# Patient Record
Sex: Female | Born: 1985 | Race: Black or African American | Hispanic: No | Marital: Single | Smoking: Current some day smoker
Health system: Southern US, Community
[De-identification: ages and names within clinical notes are randomized; demographics above are authoritative.]

## PROBLEM LIST (undated history)

## (undated) DIAGNOSIS — A749 Chlamydial infection, unspecified: Secondary | ICD-10-CM

## (undated) DIAGNOSIS — O139 Gestational [pregnancy-induced] hypertension without significant proteinuria, unspecified trimester: Secondary | ICD-10-CM

## (undated) DIAGNOSIS — A599 Trichomoniasis, unspecified: Secondary | ICD-10-CM

## (undated) DIAGNOSIS — Z789 Other specified health status: Secondary | ICD-10-CM

## (undated) DIAGNOSIS — G35 Multiple sclerosis: Secondary | ICD-10-CM

## (undated) DIAGNOSIS — G35D Multiple sclerosis, unspecified: Secondary | ICD-10-CM

## (undated) HISTORY — PX: MULTIPLE TOOTH EXTRACTIONS: SHX2053

---
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
## 2004-06-12 ENCOUNTER — Emergency Department (HOSPITAL_COMMUNITY): Admission: EM | Admit: 2004-06-12 | Discharge: 2004-06-12 | Payer: Self-pay | Admitting: Emergency Medicine

## 2005-04-01 ENCOUNTER — Emergency Department (HOSPITAL_COMMUNITY): Admission: EM | Admit: 2005-04-01 | Discharge: 2005-04-02 | Payer: Self-pay | Admitting: Emergency Medicine

## 2005-09-03 ENCOUNTER — Inpatient Hospital Stay (HOSPITAL_COMMUNITY): Admission: AD | Admit: 2005-09-03 | Discharge: 2005-09-03 | Payer: Self-pay | Admitting: Obstetrics & Gynecology

## 2005-09-27 ENCOUNTER — Ambulatory Visit (HOSPITAL_COMMUNITY): Admission: RE | Admit: 2005-09-27 | Discharge: 2005-09-27 | Payer: Self-pay | Admitting: Obstetrics

## 2005-09-27 IMAGING — US US OB COMP +14 WK
1 series · 13 of 28 positions shown · non-contrast
Comparison: none

CLINICAL DATA: Anatomic exam.  No current problems.

[Series 1: us ob comp +14 wk · 0.33mm/px · 13 of 88 slices shown]
[im 4/88]
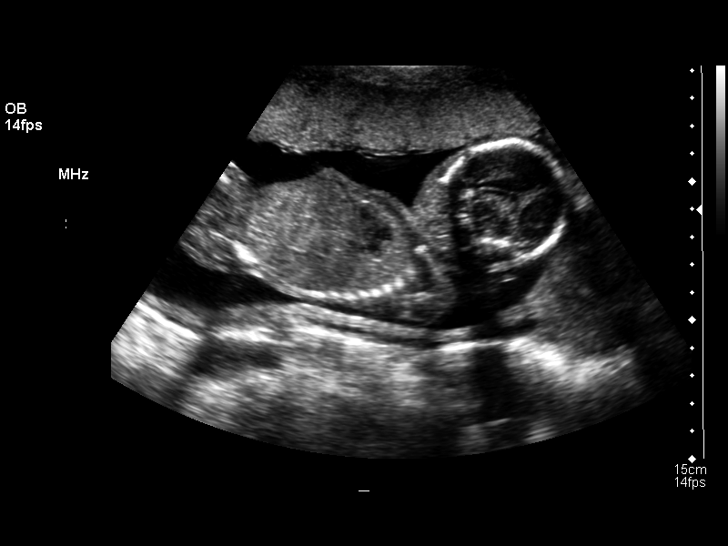
[im 10/88]
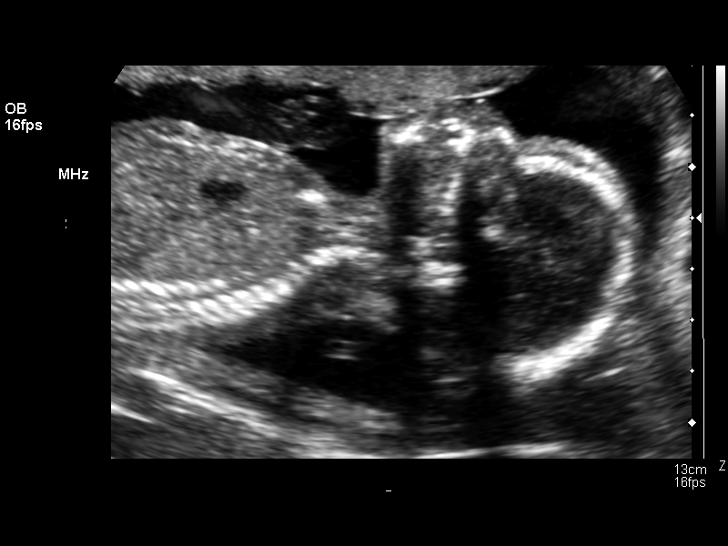
[im 17/88]
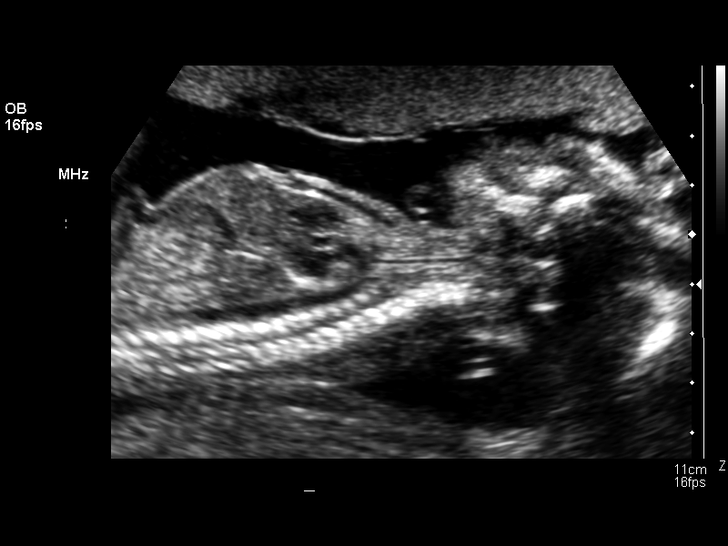
[im 23/88]
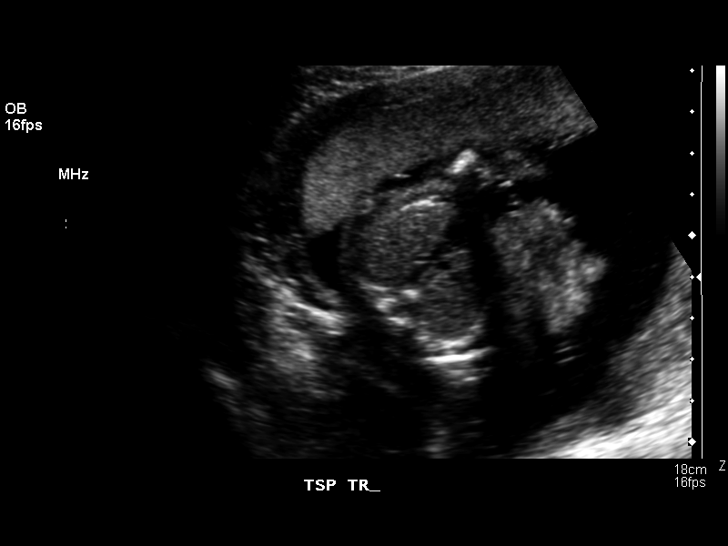
[im 30/88]
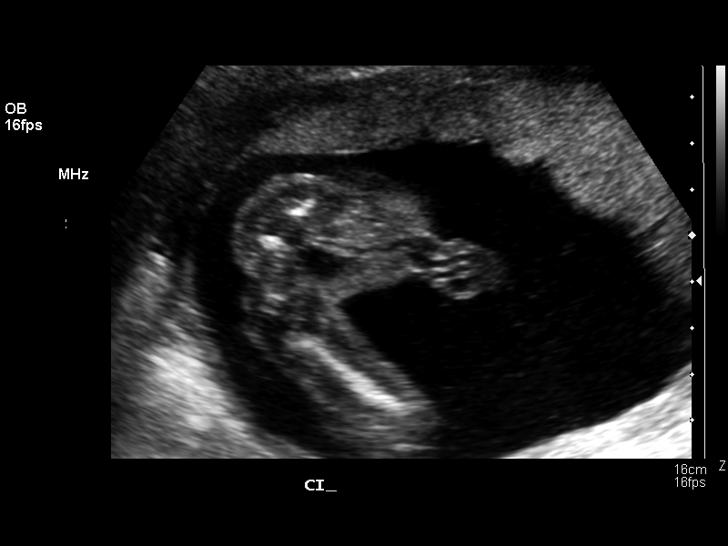
[im 36/88]
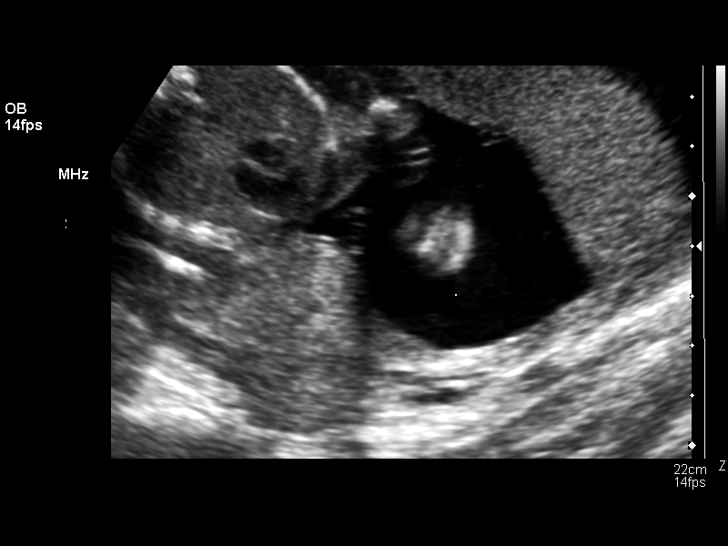
[im 46/88]
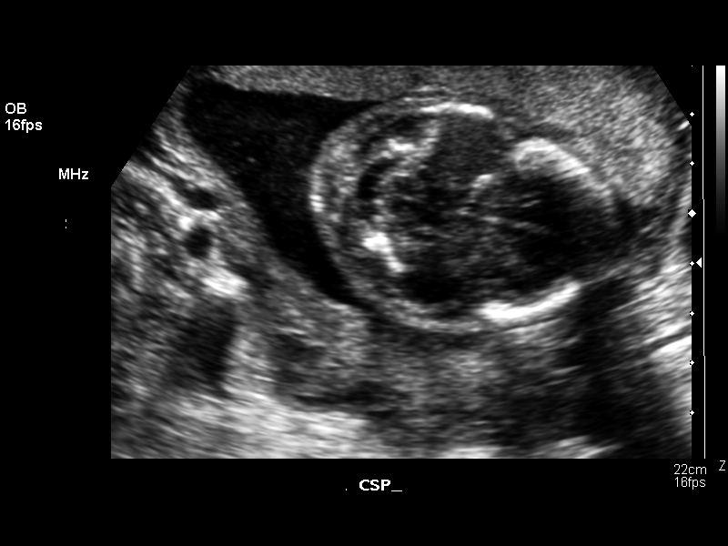
[im 52/88]
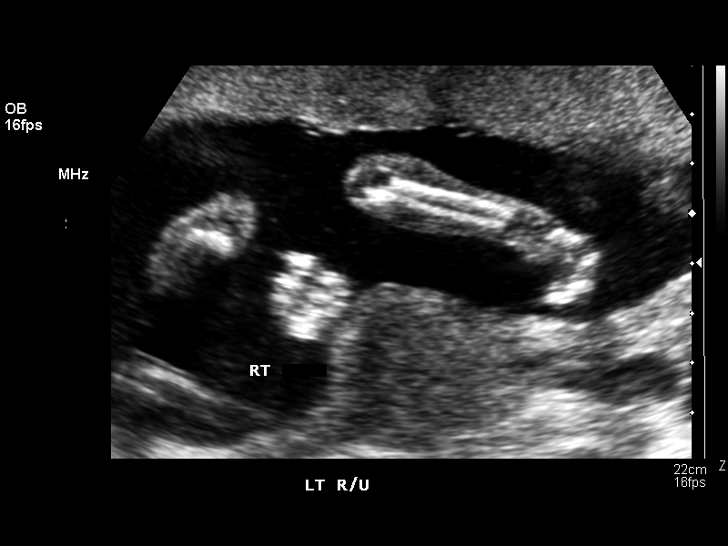
[im 59/88]
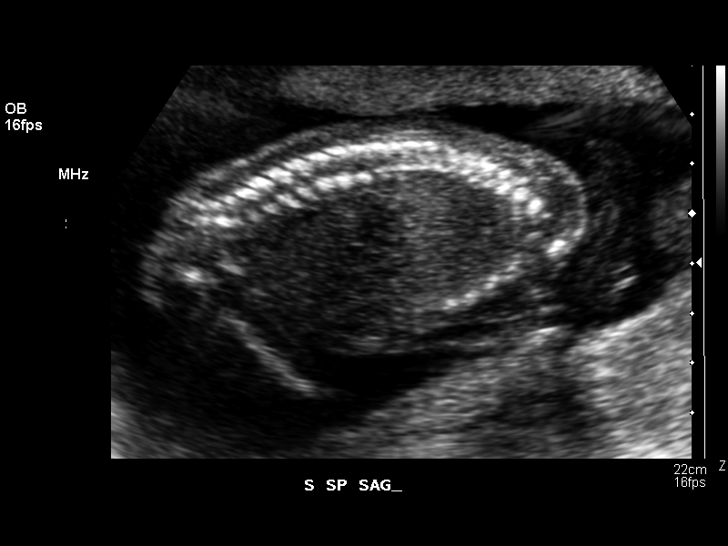
[im 65/88]
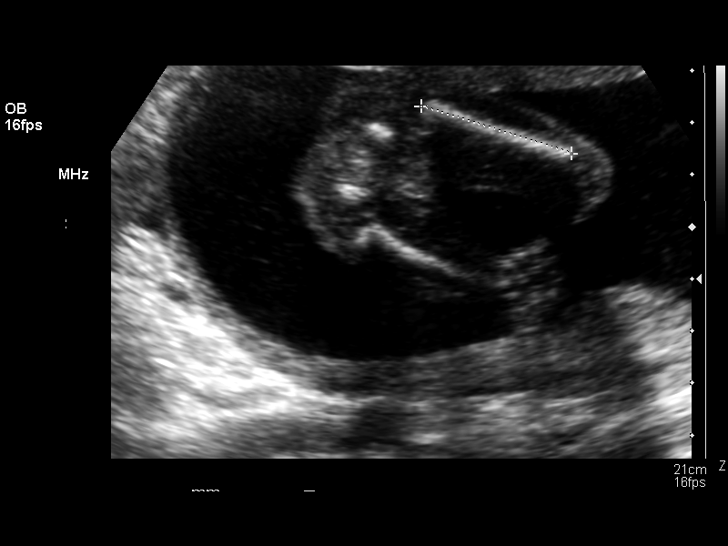
[im 71/88]
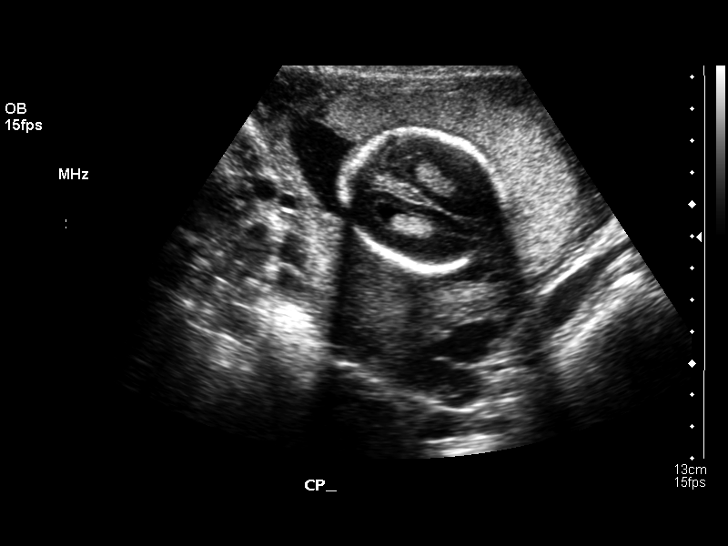
[im 78/88]
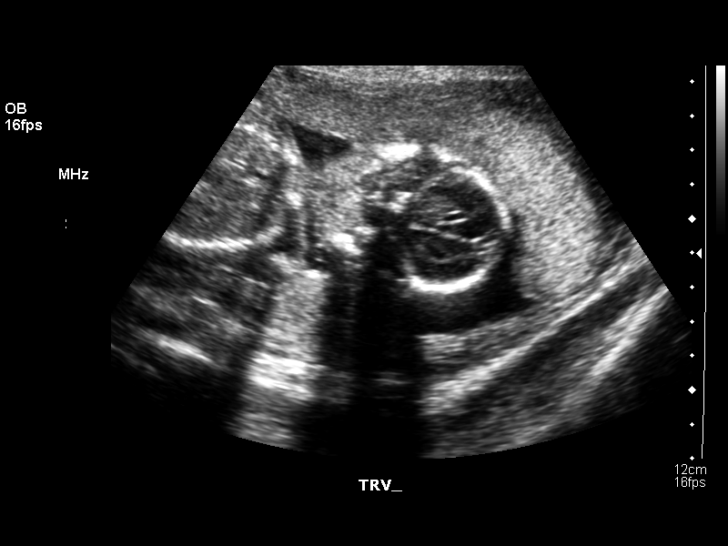
[im 84/88]
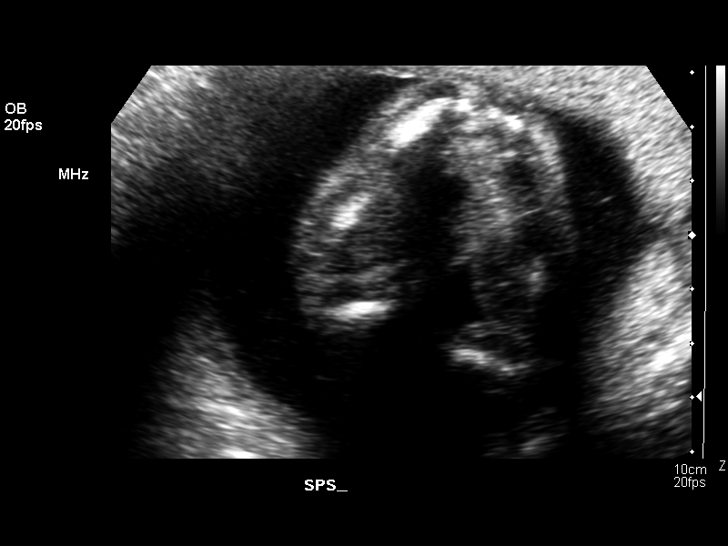

[13 of 28 positions shown; findings below may reference images not displayed]

OBSTETRICAL ULTRASOUND:
 Number of Fetuses: 1
 Heart Rate:  144
 Movement:  Yes
 Breathing:  No
 Presentation:  Transverse
 Placental Location:  Anterior
 Grade:  I
 Previa: NO
 Amniotic Fluid (Subjective): Normal
 Amniotic Fluid (Objective):   4.7 cm Vertical pocket 

 FETAL BIOMETRY
 BPD:   4.4 cm  19 w 2 d
 HC:   16.7 cm  19 w 2 d
 AC:   14.2 cm  19 w 4 d
 FL:    3.0 cm  19 w 2 d

 MEAN GA:  19 w 3 d  US EDC:  [DATE]

 FETAL ANATOMY
 Lateral Ventricles:    Visualized 
 Thalami/CSP:      Visualized 
 Posterior Fossa:  Visualized 
 Nuchal Region:    Visualized 
 Spine:      Visualized 
 4 Chamber Heart on Left:      Visualized 
 Stomach on Left:      Visualized 
 3 Vessel Cord:    Visualized 
 Cord Insertion site:    Visualized 
 Kidneys:  Visualized 
 Bladder:  Visualized 
 Extremities:      Visualized 

 ADDITIONAL ANATOMY VISUALIZED:  LVOT, RVOT, upper lip, orbits, profile, diaphragm, both heels, 5th digit, ductal arch, aortic arch, and female genitalial.

 MATERNAL UTERINE AND ADNEXAL FINDINGS
 Cervix: 5.6 cm Transabdominally
IMPRESSION: 1. Single intrauterine pregnancy demonstrating an estimated gestational age by ultrasound of 19 weeks and 3 days. Correlation with expected estimated gestational age by initial ultrasound of 19 weeks and 3 days correlates with appropriate growth.  
 2.  No focal fetal or placental abnormalities are noted with a good anatomic exam possible.
 3. Subjectively and quantitatively normal amniotic fluid volume and normal cervical length.

## 2006-02-15 ENCOUNTER — Inpatient Hospital Stay (HOSPITAL_COMMUNITY): Admission: AD | Admit: 2006-02-15 | Discharge: 2006-02-17 | Payer: Self-pay | Admitting: Obstetrics & Gynecology

## 2007-01-29 ENCOUNTER — Other Ambulatory Visit: Payer: Self-pay

## 2007-01-29 ENCOUNTER — Other Ambulatory Visit: Payer: Self-pay | Admitting: Family Medicine

## 2007-03-01 ENCOUNTER — Emergency Department (HOSPITAL_COMMUNITY): Admission: EM | Admit: 2007-03-01 | Discharge: 2007-03-01 | Payer: Self-pay | Admitting: Emergency Medicine

## 2007-03-29 ENCOUNTER — Inpatient Hospital Stay (HOSPITAL_COMMUNITY): Admission: AD | Admit: 2007-03-29 | Discharge: 2007-03-29 | Payer: Self-pay | Admitting: Obstetrics & Gynecology

## 2007-03-29 IMAGING — US US OB COMP LESS 14 WK
1 series · 14 of 28 positions shown · non-contrast
Comparison: None.

OBSTETRICAL ULTRASOUND (<14 WK OB):

Transabdominal scanning was performed.

CLINICAL DATA: Cramping and episode of vaginal bleeding.

[Series 1: us ob comp less 14 wk · 14 of 33 slices shown]
[im 2/33]
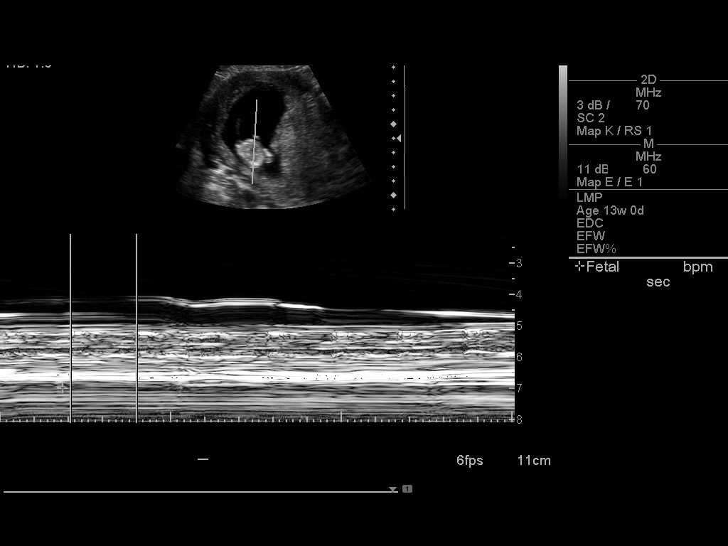
[im 4/33]
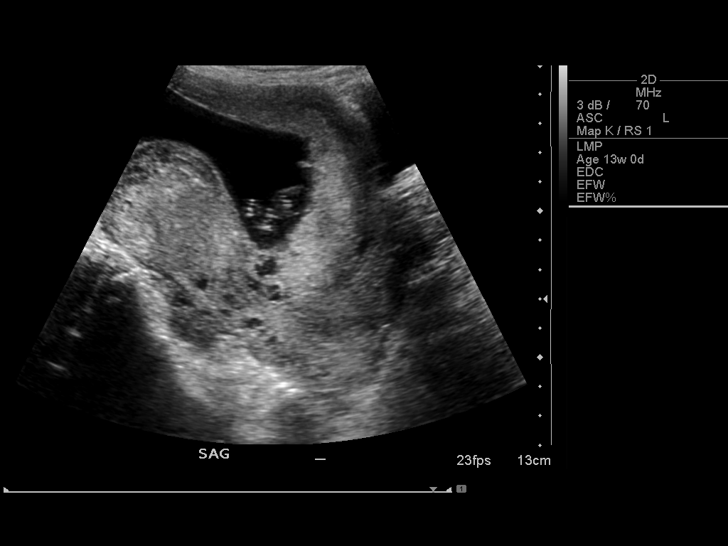
[im 6/33]
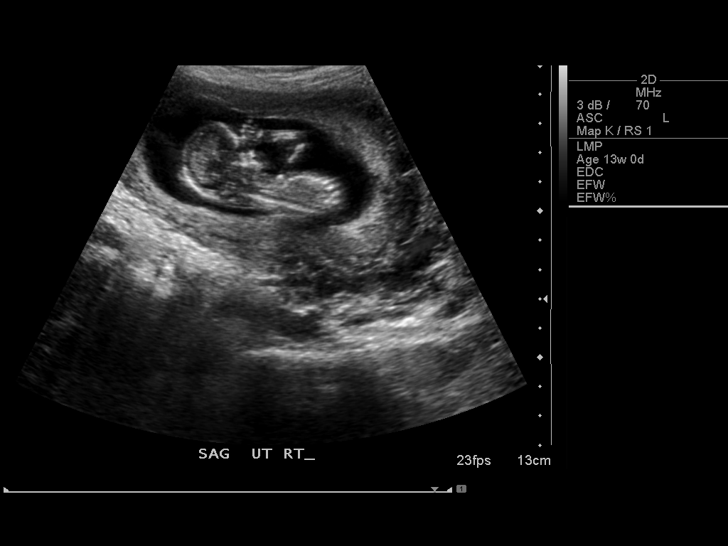
[im 9/33]
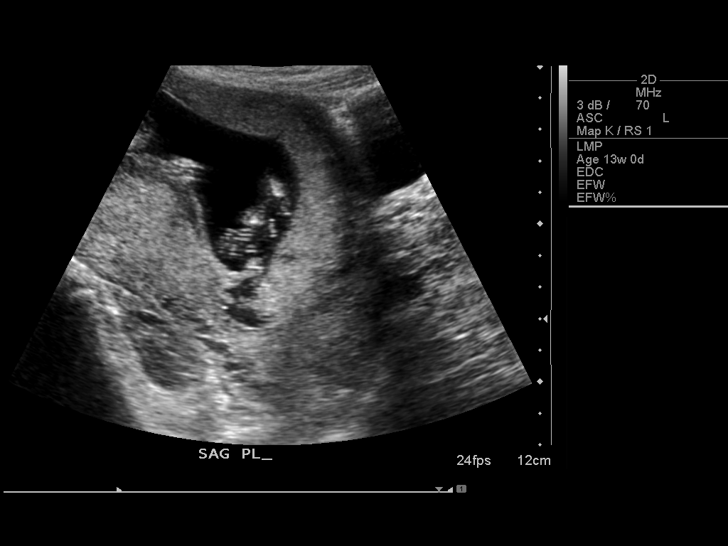
[im 11/33]
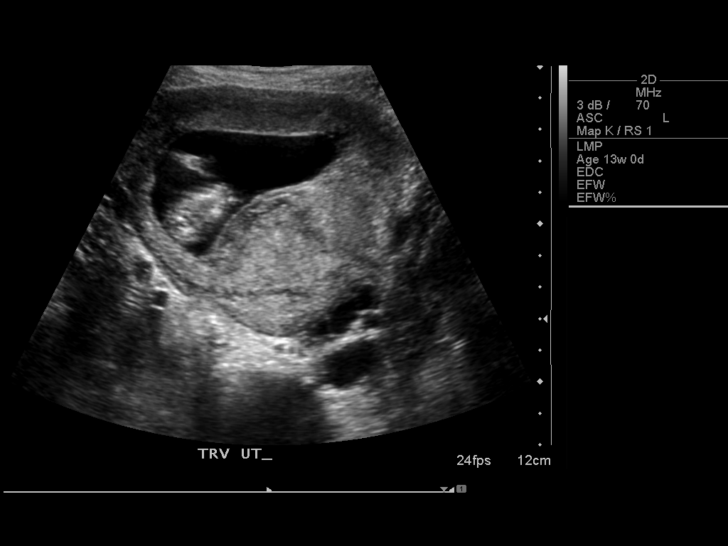
[im 14/33]
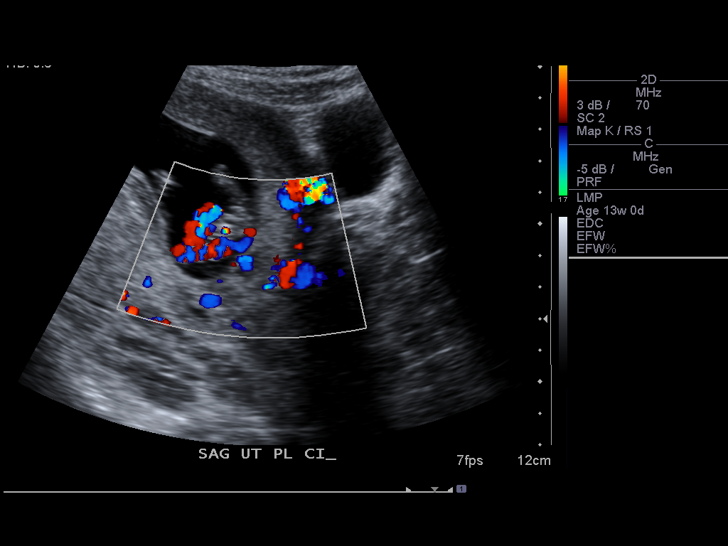
[im 16/33]
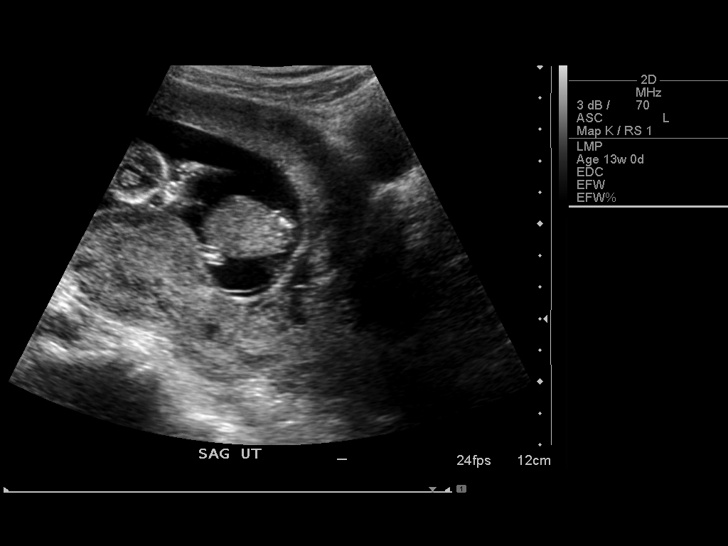
[im 18/33]
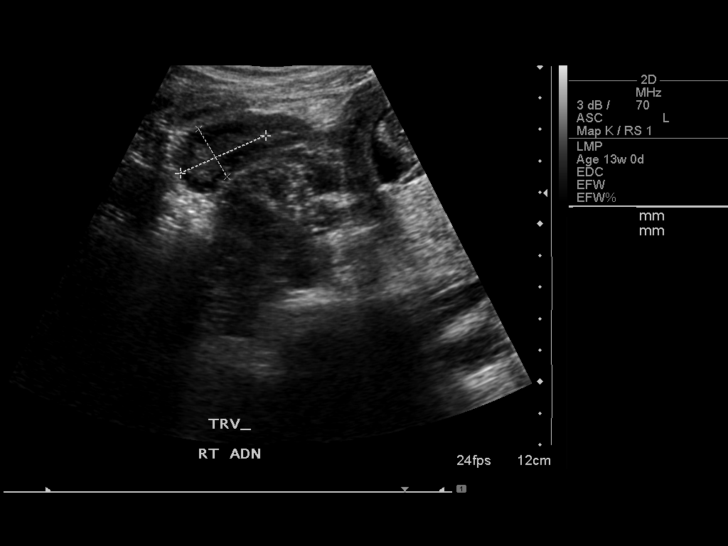
[im 21/33]
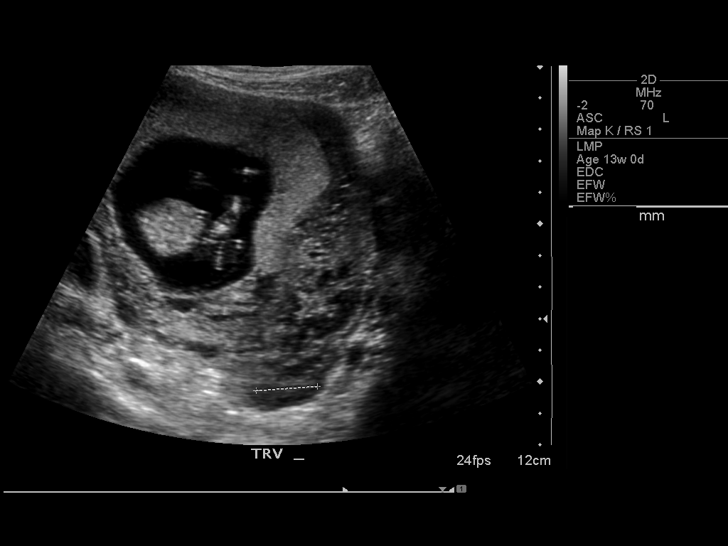
[im 23/33]
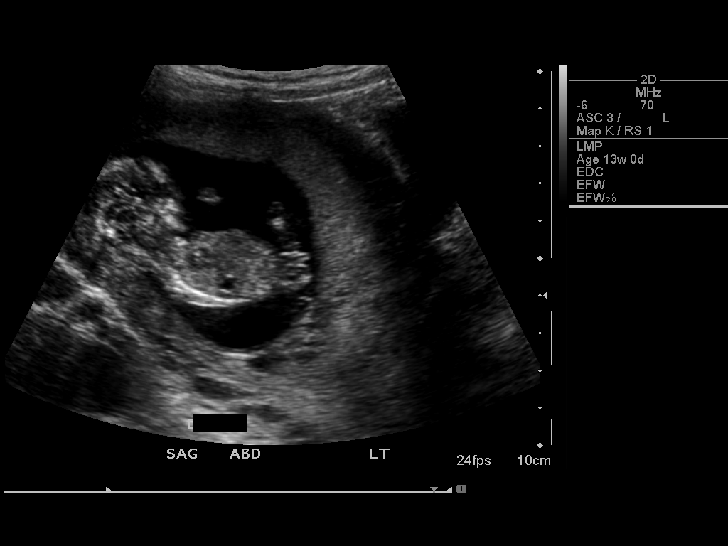
[im 25/33]
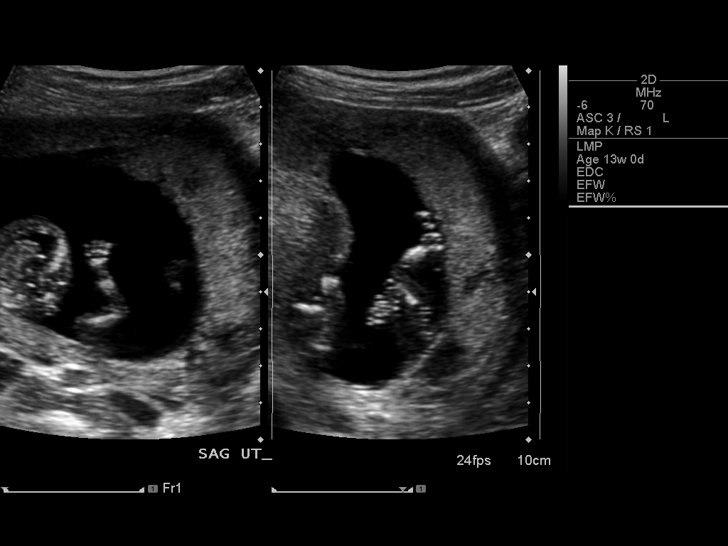
[im 28/33]
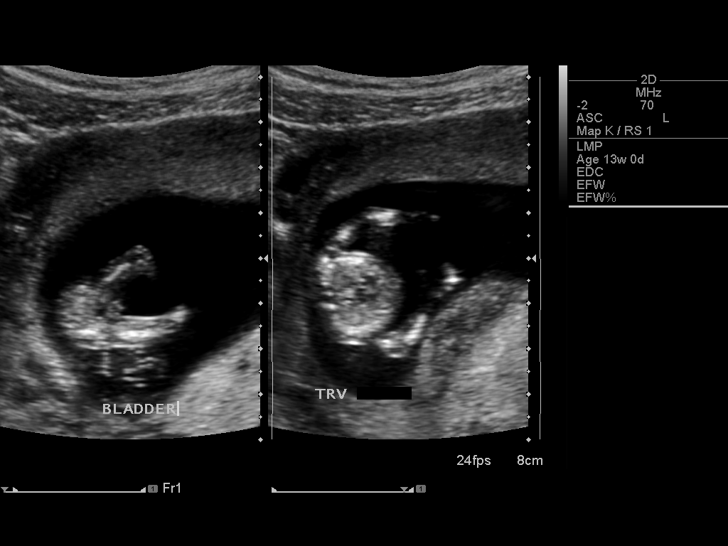
[im 30/33]
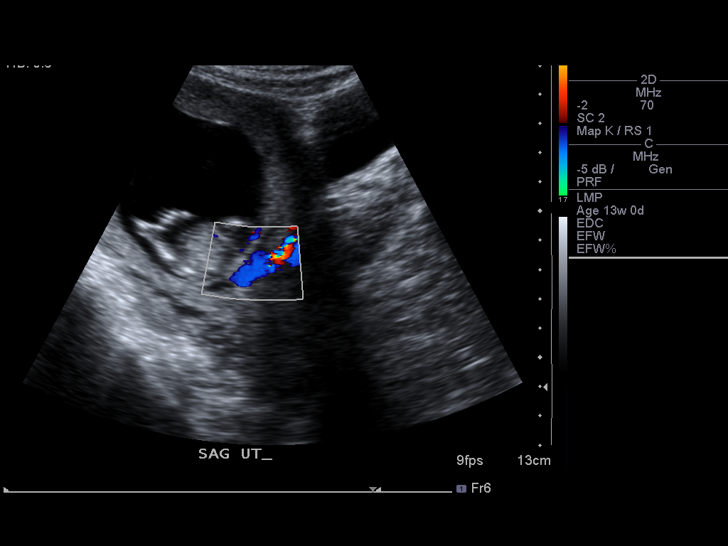
[im 33/33]
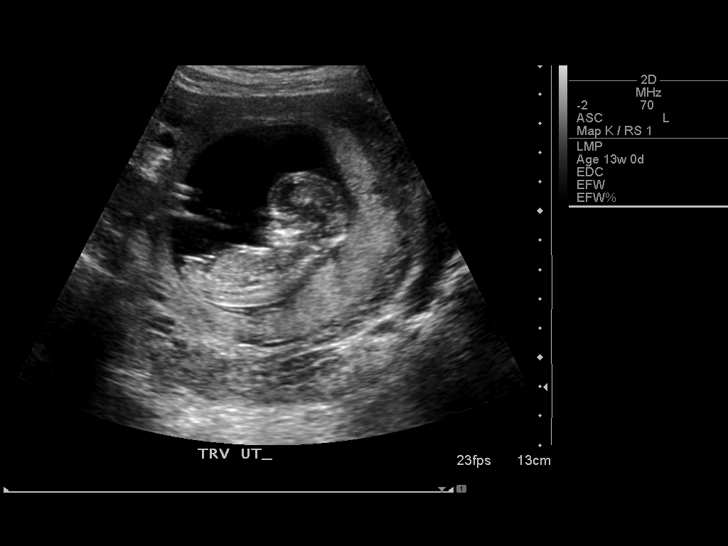

[14 of 28 positions shown; findings below may reference images not displayed]

FINDING: A single living intrauterine gestation is identified. Crown-rump length
of the fetus is 6.9 cm which estimates a 13 week 1 day gestational age. Cardiac
activity is evident within the fetus on to the grayscale imaging. Fetal heart
rate is measured at 155 beats per minute. The amnion is evident. No evidence for
retroplacental fluid collection.

Both ovaries are sonographically normal.
IMPRESSION: Single intrauterine living gestation at an estimated 13 week 1 day gestational
age by crown-rump length.

No retroplacental fluid collection.

## 2008-09-15 ENCOUNTER — Emergency Department (HOSPITAL_COMMUNITY): Admission: EM | Admit: 2008-09-15 | Discharge: 2008-09-15 | Payer: Self-pay | Admitting: Emergency Medicine

## 2008-12-22 ENCOUNTER — Emergency Department (HOSPITAL_COMMUNITY): Admission: EM | Admit: 2008-12-22 | Discharge: 2008-12-22 | Payer: Self-pay | Admitting: Emergency Medicine

## 2009-01-21 ENCOUNTER — Emergency Department (HOSPITAL_COMMUNITY): Admission: EM | Admit: 2009-01-21 | Discharge: 2009-01-21 | Payer: Self-pay | Admitting: Family Medicine

## 2009-02-18 ENCOUNTER — Emergency Department (HOSPITAL_COMMUNITY): Admission: EM | Admit: 2009-02-18 | Discharge: 2009-02-18 | Payer: Self-pay | Admitting: Emergency Medicine

## 2009-11-08 ENCOUNTER — Emergency Department (HOSPITAL_COMMUNITY): Admission: EM | Admit: 2009-11-08 | Discharge: 2009-11-08 | Payer: Self-pay | Admitting: Emergency Medicine

## 2010-03-28 NOTE — L&D Delivery Note (Signed)
Delivery Note At  a viable unspecified sex was delivered via  (Presentation: ;  ).  APGAR: , ; weight .   Placenta status: , .  Cord:  with the following complications: .  Cord pH: not done  Anesthesia: None  Episiotomy:  Lacerations:  Suture Repair: 2.0 Est. Blood Loss (mL):   Mom to postpartum.  Baby to nursery-stable.  Alicia Rojas A 01/03/2011, 9:24 PM

## 2010-03-30 ENCOUNTER — Emergency Department (HOSPITAL_COMMUNITY)
Admission: EM | Admit: 2010-03-30 | Discharge: 2010-03-30 | Payer: Self-pay | Source: Home / Self Care | Admitting: Emergency Medicine

## 2010-03-30 IMAGING — CT CT ABD-PELV W/ CM
3 of 4 series · 16 of 46 positions shown, 20 images · IV contrast (water/omni  & 100ml omni 300)
Comparison: None

CLINICAL DATA: Abdominal and pelvic pain.  No acute abnormalities.

CT ABDOMEN AND PELVIS WITH CONTRAST
TECHNIQUE: Multidetector CT imaging of the abdomen and pelvis was
performed following the standard protocol during bolus
administration of intravenous contrast.
Contrast: 100 ml intravenous [N1]

[Series 2: routine abdomen · axial · 0.72mm/px · z∈[-411,-71]mm · 12 of 79 slices shown, 16 images]
[im 7/79  soft-tissue]
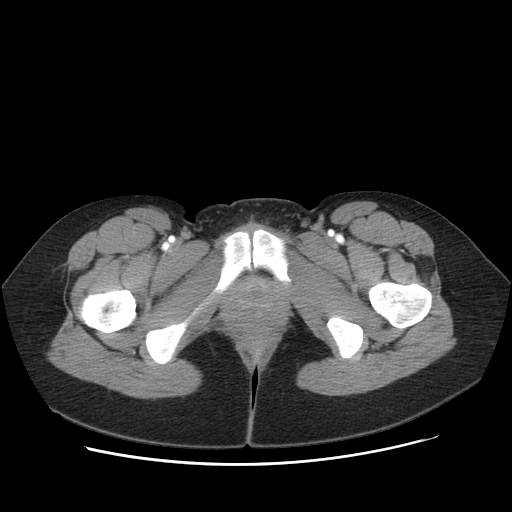
[im 7/79  bone]
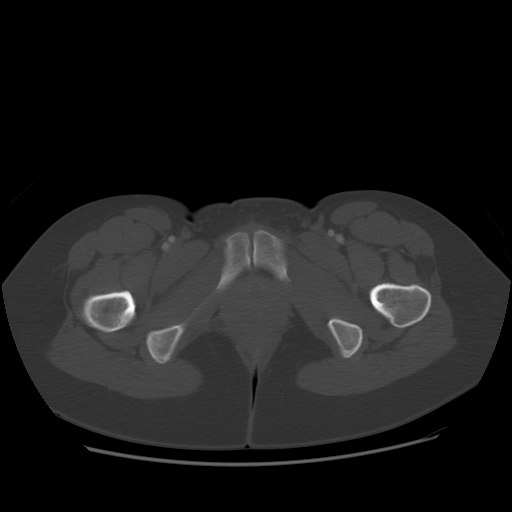
[im 14/79  soft-tissue]
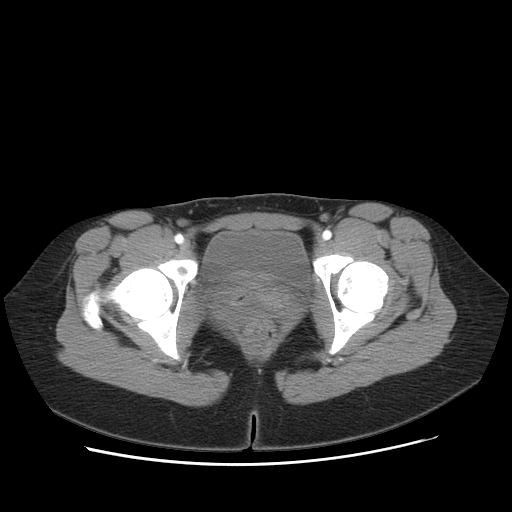
[im 21/79  soft-tissue]
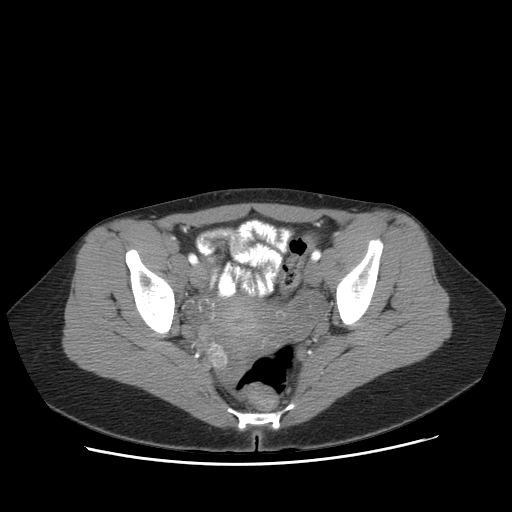
[im 28/79  soft-tissue]
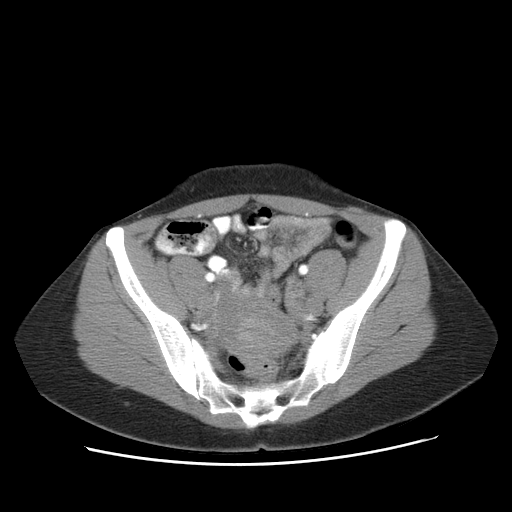
[im 34/79  soft-tissue]
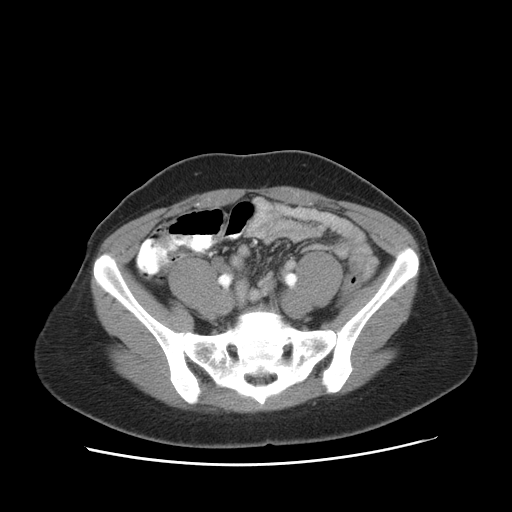
[im 45/79  soft-tissue]
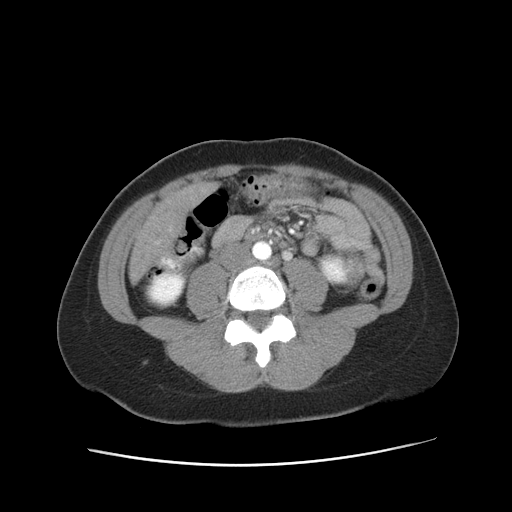
[im 51/79  soft-tissue]
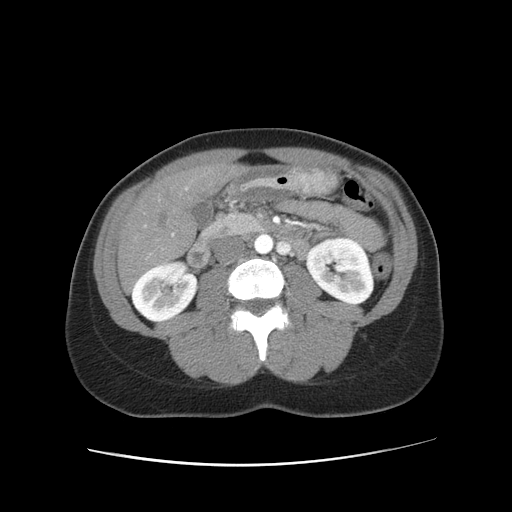
[im 58/79  soft-tissue]
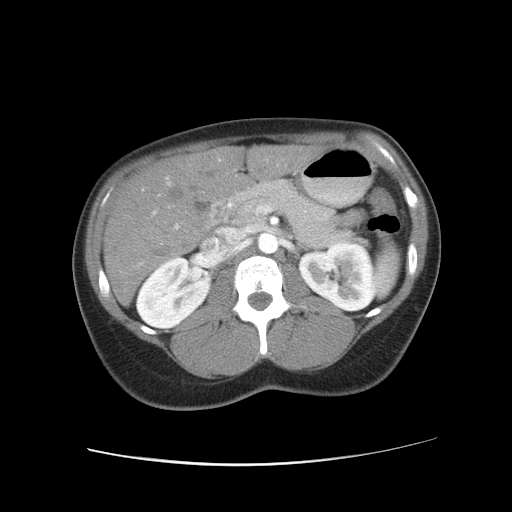
[im 65/79  soft-tissue]
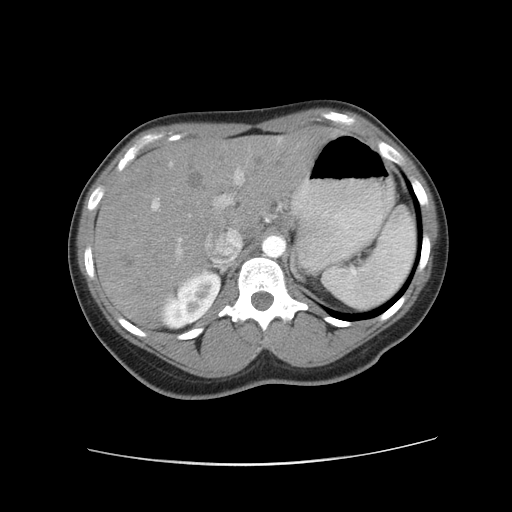
[im 65/79  lung]
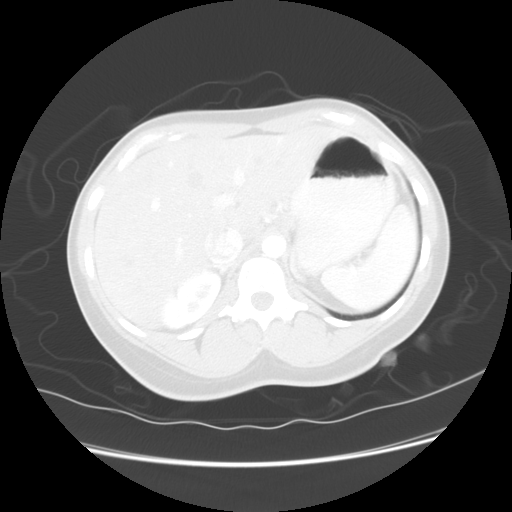
[im 65/79  bone]
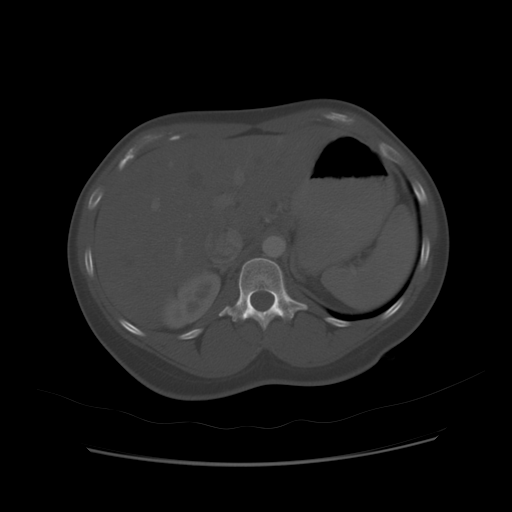
[im 68/79  lung]
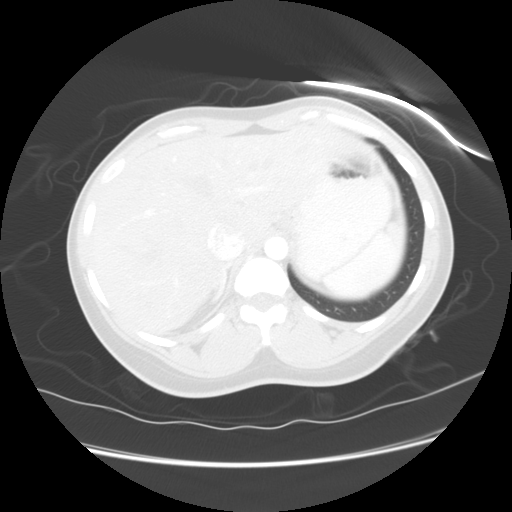
[im 72/79  soft-tissue]
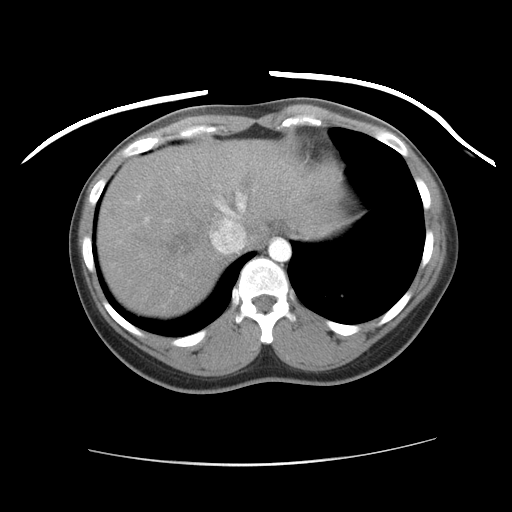
[im 72/79  lung]
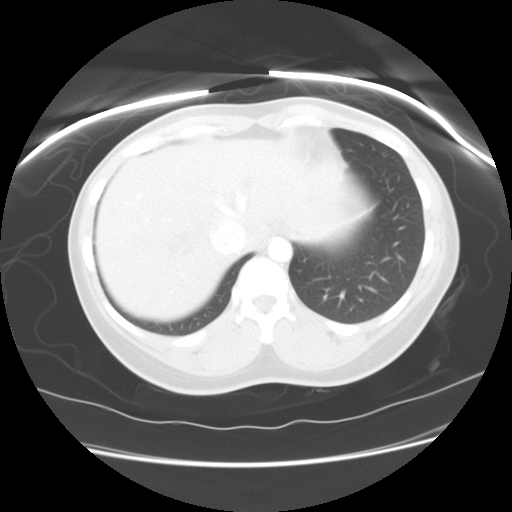
[im 75/79  lung]
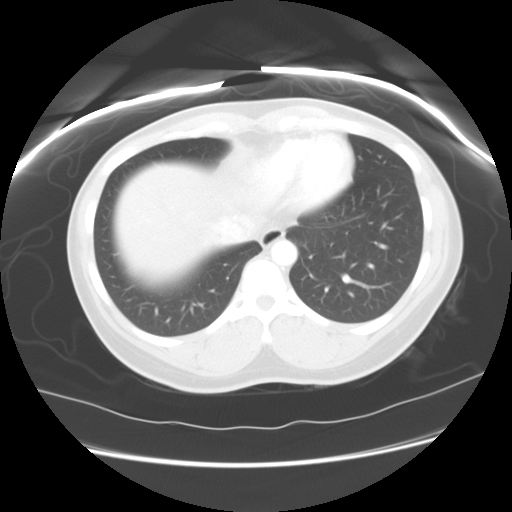

[Series 400: sagittal · sagittal · 0.78mm/px · 1 of 94 slices shown]
[im 32/94  soft-tissue]
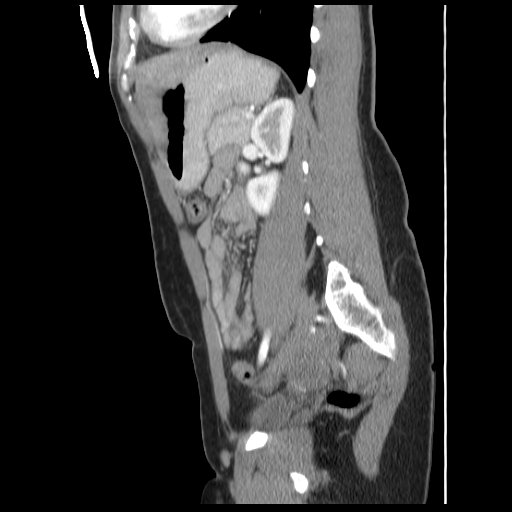

[Series 401: coronal · coronal · 0.78mm/px · 3 of 78 slices shown]
[im 26/78  soft-tissue]
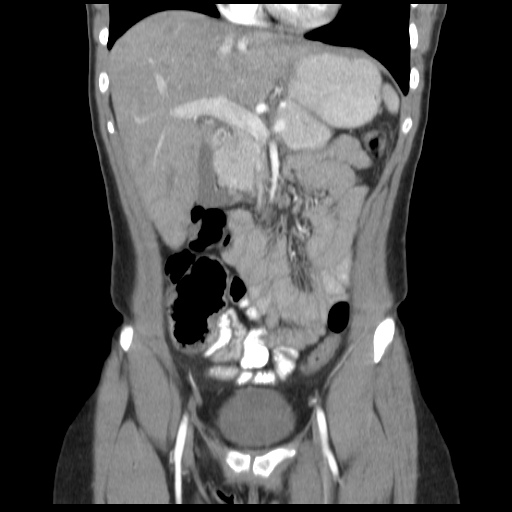
[im 35/78  soft-tissue]
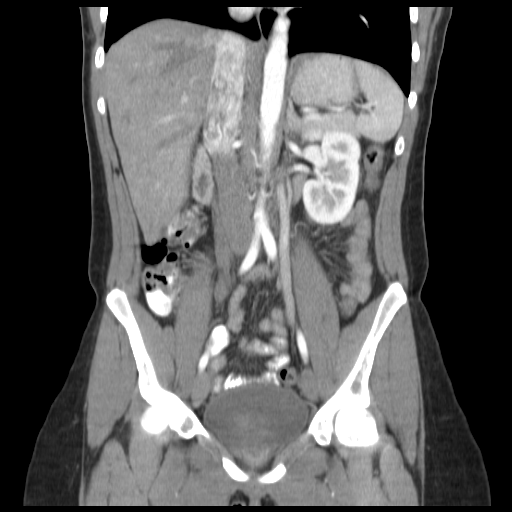
[im 43/78  soft-tissue]
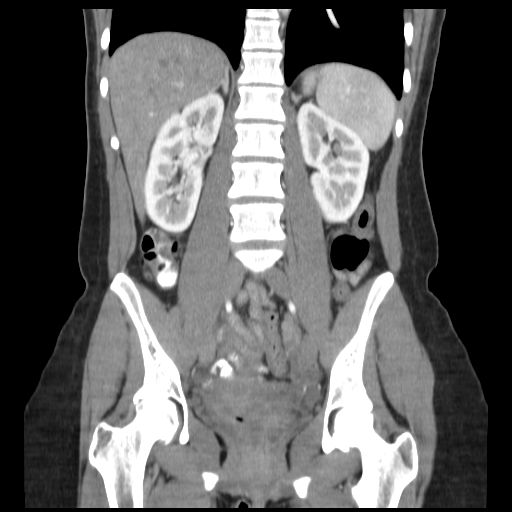

[16 of 46 positions shown; findings below may reference images not displayed]

FINDINGS: The liver, spleen, adrenal glands, pancreas and kidneys
are unremarkable.
No free fluid, enlarged lymph nodes, biliary dilation or abdominal
aortic aneurysm identified.
The bowel and bladder are unremarkable.
No acute or suspicious bony abnormalities are identified.
IMPRESSION: No significant abnormalities identified.

## 2010-04-18 ENCOUNTER — Encounter: Payer: Self-pay | Admitting: *Deleted

## 2010-06-07 LAB — URINE CULTURE
Colony Count: >=100000
Culture  Setup Time: 201201031305

## 2010-06-07 LAB — DIFFERENTIAL
Basophils Absolute: 0 10*3/uL (ref 0.0–0.1)
Basophils Relative: 0 % (ref 0–1)
Eosinophils Absolute: 0.1 10*3/uL (ref 0.0–0.7)
Eosinophils Relative: 1 % (ref 0–5)
Lymphocytes Relative: 14 % (ref 12–46)
Lymphs Abs: 1.6 10*3/uL (ref 0.7–4.0)
Monocytes Absolute: 0.9 10*3/uL (ref 0.1–1.0)
Monocytes Relative: 8 % (ref 3–12)
Neutro Abs: 8.5 10*3/uL — ABNORMAL HIGH (ref 1.7–7.7)
Neutrophils Relative %: 77 % (ref 43–77)

## 2010-06-07 LAB — WET PREP, GENITAL
Clue Cells Wet Prep HPF POC: NONE SEEN
Trich, Wet Prep: NONE SEEN
Yeast Wet Prep HPF POC: NONE SEEN

## 2010-06-07 LAB — CBC
HCT: 41.5 % (ref 36.0–46.0)
Hemoglobin: 14.2 g/dL (ref 12.0–15.0)
MCH: 30.9 pg (ref 26.0–34.0)
MCHC: 34.2 g/dL (ref 30.0–36.0)
MCV: 90.2 fL (ref 78.0–100.0)
Platelets: 258 10*3/uL (ref 150–400)
RBC: 4.6 MIL/uL (ref 3.87–5.11)
RDW: 14.1 % (ref 11.5–15.5)
WBC: 11 10*3/uL — ABNORMAL HIGH (ref 4.0–10.5)

## 2010-06-07 LAB — POCT PREGNANCY, URINE: Preg Test, Ur: NEGATIVE

## 2010-06-07 LAB — COMPREHENSIVE METABOLIC PANEL
ALT: 18 U/L (ref 0–35)
AST: 17 U/L (ref 0–37)
Albumin: 4.6 g/dL (ref 3.5–5.2)
Alkaline Phosphatase: 46 U/L (ref 39–117)
BUN: 5 mg/dL — ABNORMAL LOW (ref 6–23)
CO2: 28 mEq/L (ref 19–32)
Calcium: 9.6 mg/dL (ref 8.4–10.5)
Chloride: 105 mEq/L (ref 96–112)
Creatinine, Ser: 0.7 mg/dL (ref 0.4–1.2)
GFR calc Af Amer: >60 mL/min (ref >60)
GFR calc non Af Amer: >60 mL/min (ref >60)
Glucose, Bld: 86 mg/dL (ref 70–99)
Potassium: 3.4 mEq/L — ABNORMAL LOW (ref 3.5–5.1)
Sodium: 143 mEq/L (ref 135–145)
Total Bilirubin: 0.7 mg/dL (ref 0.3–1.2)
Total Protein: 7.9 g/dL (ref 6.0–8.3)

## 2010-06-07 LAB — URINE MICROSCOPIC-ADD ON

## 2010-06-07 LAB — URINALYSIS, ROUTINE W REFLEX MICROSCOPIC
Bilirubin Urine: NEGATIVE
Glucose, UA: NEGATIVE mg/dL
Hgb urine dipstick: NEGATIVE
Ketones, ur: NEGATIVE mg/dL
Nitrite: POSITIVE — AB
Protein, ur: 30 mg/dL — AB
Specific Gravity, Urine: 1.021 (ref 1.005–1.030)
Urobilinogen, UA: 1 mg/dL (ref 0.0–1.0)
pH: 8.5 — ABNORMAL HIGH (ref 5.0–8.0)

## 2010-06-07 LAB — GC/CHLAMYDIA PROBE AMP, GENITAL
Chlamydia, DNA Probe: POSITIVE — AB
GC Probe Amp, Genital: NEGATIVE

## 2010-06-11 LAB — URINALYSIS, ROUTINE W REFLEX MICROSCOPIC
Bilirubin Urine: NEGATIVE
Glucose, UA: NEGATIVE mg/dL
Hgb urine dipstick: NEGATIVE
Ketones, ur: NEGATIVE mg/dL
Nitrite: NEGATIVE
Protein, ur: NEGATIVE mg/dL
Specific Gravity, Urine: 1.027 (ref 1.005–1.030)
Urobilinogen, UA: 1 mg/dL (ref 0.0–1.0)
pH: 6.5 (ref 5.0–8.0)

## 2010-06-11 LAB — POCT PREGNANCY, URINE: Preg Test, Ur: NEGATIVE

## 2010-06-25 ENCOUNTER — Inpatient Hospital Stay (HOSPITAL_COMMUNITY)
Admission: AD | Admit: 2010-06-25 | Discharge: 2010-06-25 | Disposition: A | Discharge status: Home / Self Care | Payer: Medicaid Other | Source: Ambulatory Visit | Attending: Obstetrics & Gynecology | Admitting: Obstetrics & Gynecology

## 2010-06-25 DIAGNOSIS — R109 Unspecified abdominal pain: Secondary | ICD-10-CM | POA: Insufficient documentation

## 2010-06-25 DIAGNOSIS — O98319 Other infections with a predominantly sexual mode of transmission complicating pregnancy, unspecified trimester: Secondary | ICD-10-CM

## 2010-06-25 DIAGNOSIS — N739 Female pelvic inflammatory disease, unspecified: Secondary | ICD-10-CM | POA: Insufficient documentation

## 2010-06-25 DIAGNOSIS — A5619 Other chlamydial genitourinary infection: Secondary | ICD-10-CM

## 2010-06-25 LAB — URINALYSIS, ROUTINE W REFLEX MICROSCOPIC
Bilirubin Urine: NEGATIVE
Glucose, UA: NEGATIVE mg/dL
Ketones, ur: 15 mg/dL — AB
Nitrite: NEGATIVE
Protein, ur: NEGATIVE mg/dL
Specific Gravity, Urine: >1.03 — ABNORMAL HIGH (ref 1.005–1.030)
Urobilinogen, UA: 0.2 mg/dL (ref 0.0–1.0)
pH: 6 (ref 5.0–8.0)

## 2010-06-25 LAB — WET PREP, GENITAL: Yeast Wet Prep HPF POC: NONE SEEN

## 2010-06-25 LAB — URINE MICROSCOPIC-ADD ON

## 2010-06-26 LAB — GC/CHLAMYDIA PROBE AMP, GENITAL
Chlamydia, DNA Probe: NEGATIVE
GC Probe Amp, Genital: NEGATIVE

## 2010-06-30 LAB — URINE MICROSCOPIC-ADD ON

## 2010-06-30 LAB — URINALYSIS, ROUTINE W REFLEX MICROSCOPIC
Bilirubin Urine: NEGATIVE
Glucose, UA: NEGATIVE mg/dL
Ketones, ur: NEGATIVE mg/dL
Nitrite: NEGATIVE
Protein, ur: NEGATIVE mg/dL
Specific Gravity, Urine: 1.015 (ref 1.005–1.030)
Urobilinogen, UA: 1 mg/dL (ref 0.0–1.0)
pH: 7 (ref 5.0–8.0)

## 2010-07-01 LAB — POCT PREGNANCY, URINE: Preg Test, Ur: NEGATIVE

## 2010-07-01 LAB — DIFFERENTIAL
Lymphocytes Relative: 22 % (ref 12–46)
Lymphs Abs: 1.6 10*3/uL (ref 0.7–4.0)
Monocytes Relative: 10 % (ref 3–12)
Neutrophils Relative %: 65 % (ref 43–77)

## 2010-07-01 LAB — COMPREHENSIVE METABOLIC PANEL
AST: 18 U/L (ref 0–37)
CO2: 25 mEq/L (ref 19–32)
Calcium: 9.7 mg/dL (ref 8.4–10.5)
Creatinine, Ser: 0.62 mg/dL (ref 0.4–1.2)
GFR calc Af Amer: >60 mL/min (ref >60)
GFR calc non Af Amer: >60 mL/min (ref >60)
Glucose, Bld: 77 mg/dL (ref 70–99)
Total Protein: 9.1 g/dL — ABNORMAL HIGH (ref 6.0–8.3)

## 2010-07-01 LAB — POCT URINALYSIS DIP (DEVICE)
Glucose, UA: NEGATIVE mg/dL
Ketones, ur: >=160 mg/dL — AB
Specific Gravity, Urine: >=1.03 (ref 1.005–1.030)

## 2010-07-01 LAB — CBC
MCHC: 33.3 g/dL (ref 30.0–36.0)
MCV: 89.2 fL (ref 78.0–100.0)
RBC: 4.21 MIL/uL (ref 3.87–5.11)
RDW: 15.6 % — ABNORMAL HIGH (ref 11.5–15.5)

## 2010-07-01 LAB — LIPASE, BLOOD: Lipase: 18 U/L (ref 11–59)

## 2010-07-02 LAB — POCT URINALYSIS DIP (DEVICE)
Glucose, UA: NEGATIVE mg/dL
Ketones, ur: 15 mg/dL — AB
Specific Gravity, Urine: 1.025 (ref 1.005–1.030)

## 2010-07-02 LAB — HERPES SIMPLEX VIRUS CULTURE

## 2010-07-02 LAB — WET PREP, GENITAL
Trich, Wet Prep: NONE SEEN
Yeast Wet Prep HPF POC: NONE SEEN

## 2010-07-02 LAB — POCT PREGNANCY, URINE: Preg Test, Ur: NEGATIVE

## 2010-07-02 LAB — GC/CHLAMYDIA PROBE AMP, GENITAL
Chlamydia, DNA Probe: NEGATIVE
GC Probe Amp, Genital: NEGATIVE

## 2010-07-02 LAB — RPR: RPR Ser Ql: NONREACTIVE

## 2010-07-05 ENCOUNTER — Inpatient Hospital Stay (HOSPITAL_COMMUNITY)
Admission: AD | Admit: 2010-07-05 | Discharge: 2010-07-05 | Disposition: A | Discharge status: Home / Self Care | Payer: Medicaid Other | Source: Ambulatory Visit | Attending: Obstetrics | Admitting: Obstetrics

## 2010-07-05 DIAGNOSIS — B373 Candidiasis of vulva and vagina: Secondary | ICD-10-CM

## 2010-07-05 DIAGNOSIS — B3731 Acute candidiasis of vulva and vagina: Secondary | ICD-10-CM | POA: Insufficient documentation

## 2010-07-05 DIAGNOSIS — O2390 Unspecified genitourinary tract infection in pregnancy, unspecified trimester: Secondary | ICD-10-CM | POA: Insufficient documentation

## 2010-07-05 DIAGNOSIS — N949 Unspecified condition associated with female genital organs and menstrual cycle: Secondary | ICD-10-CM | POA: Insufficient documentation

## 2010-07-05 LAB — RAPID STREP SCREEN (MED CTR MEBANE ONLY): Streptococcus, Group A Screen (Direct): POSITIVE — AB

## 2010-09-20 LAB — RPR: RPR: NONREACTIVE

## 2010-11-30 ENCOUNTER — Encounter (HOSPITAL_COMMUNITY): Payer: Self-pay

## 2010-11-30 ENCOUNTER — Inpatient Hospital Stay (HOSPITAL_COMMUNITY)
Admission: AD | Admit: 2010-11-30 | Discharge: 2010-11-30 | Disposition: A | Discharge status: Home / Self Care | Payer: Medicaid Other | Source: Ambulatory Visit | Attending: Obstetrics | Admitting: Obstetrics

## 2010-11-30 DIAGNOSIS — O479 False labor, unspecified: Secondary | ICD-10-CM

## 2010-11-30 LAB — URINALYSIS, ROUTINE W REFLEX MICROSCOPIC
Glucose, UA: NEGATIVE mg/dL
Ketones, ur: NEGATIVE mg/dL
pH: 6.5 (ref 5.0–8.0)

## 2010-11-30 LAB — WET PREP, GENITAL: Trich, Wet Prep: NONE SEEN

## 2010-11-30 LAB — URINE MICROSCOPIC-ADD ON

## 2010-11-30 NOTE — ED Provider Notes (Signed)
History   Pt presents today c/o ctx that come and go since yesterday. She also c/o vag dc. Her last episode of intercourse was 2 days ago. She denies vag bleeding, or any other sx at this time. She reports GFM.  Chief Complaint  Patient presents with  . Contractions   HPI  OB History    Grav Para Term Preterm Abortions TAB SAB Ect Mult Living   4 3 3  0 0 0 0 0 0 3      History reviewed. No pertinent past medical history.  Past Surgical History  Procedure Date  . Multiple tooth extractions     No family history on file.  History  Substance Use Topics  . Smoking status: Never Smoker   . Smokeless tobacco: Never Used  . Alcohol Use: No    Allergies: No Known Allergies  Prescriptions prior to admission  Medication Sig Dispense Refill  . prenatal vitamin w/FE, FA (PRENATAL 1 + 1) 27-1 MG TABS Take 1 tablet by mouth daily.          Review of Systems  Constitutional: Negative for fever.  Gastrointestinal: Positive for abdominal pain. Negative for nausea, vomiting, diarrhea and constipation.  Genitourinary: Negative for dysuria, urgency, frequency and hematuria.  Neurological: Negative for dizziness and headaches.  Psychiatric/Behavioral: Negative for depression and suicidal ideas.   Physical Exam   Blood pressure 133/79, pulse 90, temperature 98.6 F (37 C), temperature source Oral, resp. rate 20, height 5\' 1"  (1.549 m), weight 158 lb 9.6 oz (71.94 kg), SpO2 100.00%.  Physical Exam  Constitutional: She is oriented to person, place, and time. She appears well-developed and well-nourished. No distress.  HENT:  Head: Normocephalic and atraumatic.  Eyes: EOM are normal. Pupils are equal, round, and reactive to light.  GI: Soft. She exhibits no distension. There is no tenderness. There is no rebound and no guarding.  Genitourinary: No bleeding around the vagina. Vaginal discharge found.       No pooling noted. Cervix Lg/closed.  Neurological: She is alert and oriented to  person, place, and time.  Skin: Skin is warm and dry. She is not diaphoretic.  Psychiatric: She has a normal mood and affect. Her behavior is normal. Judgment and thought content normal.    MAU Course  Procedures  Results for orders placed during the hospital encounter of 11/30/10 (from the past 24 hour(s))  URINALYSIS, ROUTINE W REFLEX MICROSCOPIC     Status: Abnormal   Collection Time   11/30/10  1:10 PM      Component Value Range   Color, Urine YELLOW  YELLOW    Appearance CLEAR  CLEAR    Specific Gravity, Urine 1.015  1.005 - 1.030    pH 6.5  5.0 - 8.0    Glucose, UA NEGATIVE  NEGATIVE (mg/dL)   Hgb urine dipstick NEGATIVE  NEGATIVE    Bilirubin Urine NEGATIVE  NEGATIVE    Ketones, ur NEGATIVE  NEGATIVE (mg/dL)   Protein, ur NEGATIVE  NEGATIVE (mg/dL)   Urobilinogen, UA 0.2  0.0 - 1.0 (mg/dL)   Nitrite NEGATIVE  NEGATIVE    Leukocytes, UA SMALL (*) NEGATIVE   URINE MICROSCOPIC-ADD ON     Status: Abnormal   Collection Time   11/30/10  1:10 PM      Component Value Range   Squamous Epithelial / LPF MANY (*) RARE    WBC, UA 3-6  <3 (WBC/hpf)   RBC / HPF 0-2  <3 (RBC/hpf)   Bacteria, UA  FEW (*) RARE   WET PREP, GENITAL     Status: Abnormal   Collection Time   11/30/10  2:25 PM      Component Value Range   Yeast, Wet Prep NONE SEEN  NONE SEEN    Trich, Wet Prep NONE SEEN  NONE SEEN    Clue Cells, Wet Prep FEW (*) NONE SEEN    WBC, Wet Prep HPF POC FEW (*) NONE SEEN    NST reactive with irregular ctx.  Assessment and Plan  Braxton Hicks: discussed with pt at length. She has f/u scheduled with Dr. Clearance Coots. Discussed signs and sx of preterm labor with pt at length. Discussed diet, activity, risks, and precautions.  Clinton Gallant. Arjay Jaskiewicz III, DrHSc, MPAS, PA-C  11/30/2010, 2:30 PM   Alicia Hoover, PA 11/30/10 1455

## 2010-11-30 NOTE — Progress Notes (Signed)
States noted ? UC's yesterday when riding in the car. Thought it might be the baby moving. Today, she has felt definite contractions. Denies hx PTL with this pregnancy or previous pregnancies.

## 2010-11-30 NOTE — Progress Notes (Signed)
Pt states she is having contractions about every 3 minutes. Had some contractions 9-3 but they stopped. Had a small amount of clear fluid leak 9-3, none today. No bleeding and has felt less fetal movement today.

## 2010-12-01 LAB — URINE CULTURE: Culture  Setup Time: 201209050040

## 2010-12-14 LAB — STREP B DNA PROBE: GBS: POSITIVE

## 2010-12-15 LAB — GC/CHLAMYDIA PROBE AMP, GENITAL: Chlamydia, DNA Probe: NEGATIVE

## 2010-12-15 LAB — URINE MICROSCOPIC-ADD ON

## 2010-12-15 LAB — CBC
HCT: 35.4 — ABNORMAL LOW
Hemoglobin: 12.4
WBC: 13 — ABNORMAL HIGH

## 2010-12-15 LAB — URINALYSIS, ROUTINE W REFLEX MICROSCOPIC
Glucose, UA: NEGATIVE
Leukocytes, UA: NEGATIVE
Specific Gravity, Urine: >1.03 — ABNORMAL HIGH
pH: 6

## 2010-12-21 ENCOUNTER — Encounter (HOSPITAL_COMMUNITY): Payer: Self-pay

## 2010-12-21 ENCOUNTER — Observation Stay (HOSPITAL_COMMUNITY)
Admission: AD | Admit: 2010-12-21 | Discharge: 2010-12-22 | Disposition: A | Discharge status: Home / Self Care | Payer: Medicaid Other | Source: Ambulatory Visit | Attending: Obstetrics & Gynecology | Admitting: Obstetrics & Gynecology

## 2010-12-21 DIAGNOSIS — O99891 Other specified diseases and conditions complicating pregnancy: Secondary | ICD-10-CM | POA: Insufficient documentation

## 2010-12-21 DIAGNOSIS — O4700 False labor before 37 completed weeks of gestation, unspecified trimester: Principal | ICD-10-CM | POA: Diagnosis present

## 2010-12-21 DIAGNOSIS — Z22330 Carrier of Group B streptococcus: Secondary | ICD-10-CM | POA: Insufficient documentation

## 2010-12-21 HISTORY — DX: Other specified health status: Z78.9

## 2010-12-21 LAB — CBC
Hemoglobin: 10.8 g/dL — ABNORMAL LOW (ref 12.0–15.0)
MCHC: 33 g/dL (ref 30.0–36.0)
RDW: 14.7 % (ref 11.5–15.5)
WBC: 18.9 10*3/uL — ABNORMAL HIGH (ref 4.0–10.5)

## 2010-12-21 MED ORDER — DIPHENHYDRAMINE HCL 50 MG/ML IJ SOLN
12.5000 mg | INTRAMUSCULAR | Status: DC | PRN
Start: 2010-12-21 — End: 2010-12-22

## 2010-12-21 MED ORDER — PENICILLIN G POTASSIUM 5000000 UNITS IJ SOLR
2.5000 10*6.[IU] | INTRAVENOUS | Status: DC
  Administered 2010-12-22 (×2): 2.5 10*6.[IU] via INTRAVENOUS
  Filled 2010-12-21 (×4): qty 2.5

## 2010-12-21 MED ORDER — FLEET ENEMA 7-19 GM/118ML RE ENEM: 1.0000 | ENEMA | RECTAL | Status: DC | PRN

## 2010-12-21 MED ORDER — PHENYLEPHRINE 40 MCG/ML (10ML) SYRINGE FOR IV PUSH (FOR BLOOD PRESSURE SUPPORT): 80.0000 ug | PREFILLED_SYRINGE | INTRAVENOUS | Status: DC | PRN

## 2010-12-21 MED ORDER — ONDANSETRON HCL 4 MG/2ML IJ SOLN: 4.0000 mg | Freq: Four times a day (QID) | INTRAMUSCULAR | Status: DC | PRN

## 2010-12-21 MED ORDER — OXYTOCIN 20 UNITS IN LACTATED RINGERS INFUSION - SIMPLE: 125.0000 mL/h | Freq: Once | INTRAVENOUS | Status: DC

## 2010-12-21 MED ORDER — LIDOCAINE HCL (PF) 1 % IJ SOLN: 30.0000 mL | INTRAMUSCULAR | Status: DC | PRN

## 2010-12-21 MED ORDER — LACTATED RINGERS IV SOLN
INTRAVENOUS | Status: DC
  Administered 2010-12-21 – 2010-12-22 (×2): via INTRAVENOUS

## 2010-12-21 MED ORDER — LACTATED RINGERS IV SOLN: 500.0000 mL | Freq: Once | INTRAVENOUS | Status: DC

## 2010-12-21 MED ORDER — ACETAMINOPHEN 325 MG PO TABS: 650.0000 mg | ORAL_TABLET | ORAL | Status: DC | PRN

## 2010-12-21 MED ORDER — EPHEDRINE 5 MG/ML INJ: 10.0000 mg | INTRAVENOUS | Status: DC | PRN

## 2010-12-21 MED ORDER — FENTANYL 2.5 MCG/ML BUPIVACAINE 1/10 % EPIDURAL INFUSION (WH - ANES): 14.0000 mL/h | INTRAMUSCULAR | Status: DC

## 2010-12-21 MED ORDER — OXYCODONE-ACETAMINOPHEN 5-325 MG PO TABS: 2.0000 | ORAL_TABLET | ORAL | Status: DC | PRN

## 2010-12-21 MED ORDER — OXYTOCIN BOLUS FROM INFUSION
500.0000 mL | Freq: Once | INTRAVENOUS | Status: DC
  Filled 2010-12-21: qty 500

## 2010-12-21 MED ORDER — BUTORPHANOL TARTRATE 2 MG/ML IJ SOLN: 1.0000 mg | INTRAMUSCULAR | Status: DC | PRN

## 2010-12-21 MED ORDER — IBUPROFEN 600 MG PO TABS: 600.0000 mg | ORAL_TABLET | Freq: Four times a day (QID) | ORAL | Status: DC | PRN

## 2010-12-21 MED ORDER — LACTATED RINGERS IV SOLN: 500.0000 mL | INTRAVENOUS | Status: DC | PRN

## 2010-12-21 MED ORDER — PENICILLIN G POTASSIUM 5000000 UNITS IJ SOLR
5.0000 10*6.[IU] | Freq: Once | INTRAMUSCULAR | Status: AC
  Administered 2010-12-21: 5 10*6.[IU] via INTRAVENOUS
  Filled 2010-12-21: qty 5

## 2010-12-21 MED ORDER — CITRIC ACID-SODIUM CITRATE 334-500 MG/5ML PO SOLN: 30.0000 mL | ORAL | Status: DC | PRN

## 2010-12-21 NOTE — Progress Notes (Signed)
Pt reports contractions this pm, was 5 cm in office today.

## 2010-12-21 NOTE — Plan of Care (Signed)
Problem: Consults Goal: Birthing Suites Patient Information Press F2 to bring up selections list Outcome: Completed/Met Date Met:  12/21/10  Pt < [redacted] weeks EGA

## 2010-12-21 NOTE — Progress Notes (Signed)
Spoke with Dr Tamela Oddi. Notified that pt does not have any medical records on file. Stated she would get records from office once pt delivered. Updated on contraction pattern, FHR and history of rapid deliveries. Stated pt could have epidural once cervical change is made.

## 2010-12-22 DIAGNOSIS — O4700 False labor before 37 completed weeks of gestation, unspecified trimester: Secondary | ICD-10-CM | POA: Diagnosis present

## 2010-12-22 HISTORY — DX: False labor before 37 completed weeks of gestation, unspecified trimester: O47.00

## 2010-12-22 LAB — RPR: RPR Ser Ql: NONREACTIVE

## 2010-12-22 NOTE — H&P (Signed)
Alicia Rojas is a 25 y.o. female presenting for R/O labor. Maternal Medical History:  Reason for admission: Reason for admission: contractions.  Reason for Admission:   nauseaContractions: Onset was 6-12 hours ago.   Frequency: irregular.    Fetal activity: Perceived fetal activity is normal.    Prenatal complications: no prenatal complications   OB History    Grav Para Term Preterm Abortions TAB SAB Ect Mult Living   4 3 3  0 0 0 0 0 0 3     Past Medical History  Diagnosis Date  . No pertinent past medical history    Past Surgical History  Procedure Date  . Multiple tooth extractions   . No past surgeries    Family History: family history is not on file. Social History:  reports that she has never smoked. She has never used smokeless tobacco. She reports that she does not drink alcohol or use illicit drugs.  Review of Systems  Constitutional: Negative for fever.  Eyes: Negative for blurred vision.  Respiratory: Negative for shortness of breath.   Gastrointestinal: Negative for nausea and vomiting.  Skin: Negative for rash.  Neurological: Negative for headaches.    Dilation: 5 Effacement (%): 70 Station: -3 Exam by:: Lucy Chris RNC Blood pressure 117/65, pulse 87, temperature 98.1 F (36.7 C), temperature source Oral, resp. rate 20, height 5\' 1"  (1.549 m), weight 71.668 kg (158 lb). Maternal Exam:  Uterine Assessment: Contraction strength is mild.  Abdomen: Patient reports no abdominal tenderness. Fetal presentation: vertex  Introitus: not evaluated.     Fetal Exam Fetal Monitor Review: Variability: moderate (6-25 bpm).   Pattern: accelerations present and no decelerations.    Fetal State Assessment: Category I - tracings are normal.     Physical Exam  Constitutional: She appears well-developed.  HENT:  Head: Normocephalic.  Neck: Neck supple. No thyromegaly present.  Cardiovascular: Normal rate and regular rhythm.   Respiratory: Breath sounds  normal.  GI: Soft. Bowel sounds are normal.  Skin: No rash noted.    Prenatal labs: ABO, Rh:   Antibody:   Rubella:   RPR: NON REACTIVE (09/25 2240)  HBsAg:    HIV: Non-reactive (06/25 0000)  GBS: Positive (09/18 0000)   Assessment/Plan: 25 y.o. w/an IUP @ [redacted]w[redacted]d c/o UCs R/O labor  Admit  PCN GBS prophylaxis Monitor progress   JACKSON-MOORE,Marriah Sanderlin A 12/22/2010, 8:39 AM

## 2010-12-22 NOTE — Discharge Summary (Signed)
Obstetric Discharge Summary Reason for Admission: observation/evaluation Prenatal Procedures: none Intrapartum Procedures: none Postpartum Procedures: N/A Complications-Operative and Postpartum: none Hemoglobin  Date Value Range Status  12/21/2010 10.8* 12.0-15.0 (g/dL) Final     HCT  Date Value Range Status  12/21/2010 32.7* 36.0-46.0 (%) Final    Discharge Diagnoses: False labor  Discharge Information: Date: 12/22/2010 Activity: unrestricted Diet: routine Medications: PNV Condition: stable Instructions: refer to practice specific booklet and see above Discharge to: home Follow-up Information    Please follow up. (keep previous appointment)            Antionette Char A 12/22/2010, 8:49 AM

## 2010-12-22 NOTE — Progress Notes (Signed)
Alicia Rojas is a 25 y.o. [phone] at [redacted]w[redacted]d by LMP admitted for R/O labor  Subjective:  Resting Objective: BP 117/65  Pulse 87  Temp(Src) 98.1 F (36.7 C) (Oral)  Resp 20  Ht 5\' 1"  (1.549 m)  Wt 71.668 kg (158 lb)  BMI 29.85 kg/m2      FHT:  FHR: 140 bpm, variability: moderate,  accelerations:  Present,  decelerations:  Absent UC:   irregular, every 15  minutes SVE:   Dilation: 5 Effacement (%): 70 Station: -3 Exam by:: Lucy Chris RNC  Labs: Lab Results  Component Value Date   WBC 18.9* 12/21/2010   HGB 10.8* 12/21/2010   HCT 32.7* 12/21/2010   MCV 85.8 12/21/2010   PLT 331 12/21/2010    Assessment / Plan: False labor  Fetal Wellbeing:  Category I D/C home JACKSON-MOORE,Husna Krone A 12/22/2010, 8:44 AM

## 2010-12-23 LAB — ABO/RH: RH Type: POSITIVE

## 2011-01-03 ENCOUNTER — Encounter (HOSPITAL_COMMUNITY): Payer: Self-pay | Admitting: *Deleted

## 2011-01-03 ENCOUNTER — Inpatient Hospital Stay (HOSPITAL_COMMUNITY)
Admission: AD | Admit: 2011-01-03 | Discharge: 2011-01-05 | DRG: 775 | Disposition: A | Discharge status: Home / Self Care | Payer: Medicaid Other | Source: Ambulatory Visit | Attending: Obstetrics & Gynecology | Admitting: Obstetrics & Gynecology

## 2011-01-03 DIAGNOSIS — O4700 False labor before 37 completed weeks of gestation, unspecified trimester: Secondary | ICD-10-CM

## 2011-01-03 DIAGNOSIS — O99892 Other specified diseases and conditions complicating childbirth: Principal | ICD-10-CM | POA: Diagnosis present

## 2011-01-03 DIAGNOSIS — Z22330 Carrier of Group B streptococcus: Secondary | ICD-10-CM

## 2011-01-03 LAB — POCT FERN TEST: Fern Test: POSITIVE

## 2011-01-03 LAB — RPR: RPR Ser Ql: NONREACTIVE

## 2011-01-03 LAB — URINE CULTURE: Colony Count: >=100000

## 2011-01-03 LAB — URINALYSIS, ROUTINE W REFLEX MICROSCOPIC
Bilirubin Urine: NEGATIVE
Hgb urine dipstick: NEGATIVE
Protein, ur: NEGATIVE
Urobilinogen, UA: 0.2

## 2011-01-03 LAB — GC/CHLAMYDIA PROBE AMP, GENITAL
Chlamydia, DNA Probe: NEGATIVE
GC Probe Amp, Genital: NEGATIVE

## 2011-01-03 LAB — CBC
HCT: 32 % — ABNORMAL LOW (ref 36.0–46.0)
Hemoglobin: 10.4 g/dL — ABNORMAL LOW (ref 12.0–15.0)
MCH: 27.9 pg (ref 26.0–34.0)
MCHC: 32.5 g/dL (ref 30.0–36.0)
RDW: 15 % (ref 11.5–15.5)

## 2011-01-03 LAB — URINE MICROSCOPIC-ADD ON

## 2011-01-03 LAB — WET PREP, GENITAL: Trich, Wet Prep: NONE SEEN

## 2011-01-03 LAB — PREGNANCY, URINE: Preg Test, Ur: POSITIVE

## 2011-01-03 MED ORDER — CITRIC ACID-SODIUM CITRATE 334-500 MG/5ML PO SOLN: 30.0000 mL | ORAL | Status: DC | PRN

## 2011-01-03 MED ORDER — TETANUS-DIPHTH-ACELL PERTUSSIS 5-2.5-18.5 LF-MCG/0.5 IM SUSP: 0.5000 mL | Freq: Once | INTRAMUSCULAR | Status: DC

## 2011-01-03 MED ORDER — LACTATED RINGERS IV SOLN: INTRAVENOUS | Status: DC

## 2011-01-03 MED ORDER — OXYCODONE-ACETAMINOPHEN 5-325 MG PO TABS: 1.0000 | ORAL_TABLET | ORAL | Status: DC | PRN

## 2011-01-03 MED ORDER — OXYTOCIN 20 UNITS IN LACTATED RINGERS INFUSION - SIMPLE: 125.0000 mL/h | Freq: Once | INTRAVENOUS | Status: DC

## 2011-01-03 MED ORDER — IBUPROFEN 600 MG PO TABS
600.0000 mg | ORAL_TABLET | Freq: Four times a day (QID) | ORAL | Status: DC | PRN
  Administered 2011-01-03: 600 mg via ORAL
  Filled 2011-01-03 (×6): qty 1

## 2011-01-03 MED ORDER — LIDOCAINE HCL (PF) 1 % IJ SOLN
30.0000 mL | INTRAMUSCULAR | Status: DC | PRN
  Filled 2011-01-03 (×2): qty 30

## 2011-01-03 MED ORDER — LACTATED RINGERS IV SOLN: 500.0000 mL | INTRAVENOUS | Status: DC | PRN

## 2011-01-03 MED ORDER — SIMETHICONE 80 MG PO CHEW: 80.0000 mg | CHEWABLE_TABLET | ORAL | Status: DC | PRN

## 2011-01-03 MED ORDER — DIBUCAINE 1 % RE OINT: 1.0000 "application " | TOPICAL_OINTMENT | RECTAL | Status: DC | PRN

## 2011-01-03 MED ORDER — OXYCODONE-ACETAMINOPHEN 5-325 MG PO TABS: 2.0000 | ORAL_TABLET | ORAL | Status: DC | PRN

## 2011-01-03 MED ORDER — IBUPROFEN 600 MG PO TABS
600.0000 mg | ORAL_TABLET | Freq: Four times a day (QID) | ORAL | Status: DC
  Administered 2011-01-04 – 2011-01-05 (×7): 600 mg via ORAL
  Filled 2011-01-03 (×2): qty 1

## 2011-01-03 MED ORDER — ONDANSETRON HCL 4 MG PO TABS: 4.0000 mg | ORAL_TABLET | ORAL | Status: DC | PRN

## 2011-01-03 MED ORDER — PRENATAL PLUS 27-1 MG PO TABS
1.0000 | ORAL_TABLET | Freq: Every day | ORAL | Status: DC
  Administered 2011-01-04 – 2011-01-05 (×2): 1 via ORAL
  Filled 2011-01-03 (×2): qty 1

## 2011-01-03 MED ORDER — FLEET ENEMA 7-19 GM/118ML RE ENEM: 1.0000 | ENEMA | RECTAL | Status: DC | PRN

## 2011-01-03 MED ORDER — ZOLPIDEM TARTRATE 5 MG PO TABS: 5.0000 mg | ORAL_TABLET | Freq: Every evening | ORAL | Status: DC | PRN

## 2011-01-03 MED ORDER — LANOLIN HYDROUS EX OINT: TOPICAL_OINTMENT | CUTANEOUS | Status: DC | PRN

## 2011-01-03 MED ORDER — FERROUS SULFATE 325 (65 FE) MG PO TABS
325.0000 mg | ORAL_TABLET | Freq: Two times a day (BID) | ORAL | Status: DC
  Administered 2011-01-04 – 2011-01-05 (×4): 325 mg via ORAL
  Filled 2011-01-03 (×4): qty 1

## 2011-01-03 MED ORDER — ONDANSETRON HCL 4 MG/2ML IJ SOLN: 4.0000 mg | Freq: Four times a day (QID) | INTRAMUSCULAR | Status: DC | PRN

## 2011-01-03 MED ORDER — WITCH HAZEL-GLYCERIN EX PADS
1.0000 "application " | MEDICATED_PAD | CUTANEOUS | Status: DC | PRN
  Administered 2011-01-04: 1 via TOPICAL

## 2011-01-03 MED ORDER — ACETAMINOPHEN 325 MG PO TABS: 650.0000 mg | ORAL_TABLET | ORAL | Status: DC | PRN

## 2011-01-03 MED ORDER — SENNOSIDES-DOCUSATE SODIUM 8.6-50 MG PO TABS
2.0000 | ORAL_TABLET | Freq: Every day | ORAL | Status: DC
  Administered 2011-01-04: 2 via ORAL

## 2011-01-03 MED ORDER — SODIUM CHLORIDE 0.9 % IV SOLN
2.0000 g | Freq: Four times a day (QID) | INTRAVENOUS | Status: DC
  Administered 2011-01-03: 2 g via INTRAVENOUS
  Filled 2011-01-03 (×2): qty 2000

## 2011-01-03 MED ORDER — DIPHENHYDRAMINE HCL 25 MG PO CAPS: 25.0000 mg | ORAL_CAPSULE | Freq: Four times a day (QID) | ORAL | Status: DC | PRN

## 2011-01-03 MED ORDER — OXYTOCIN BOLUS FROM INFUSION
500.0000 mL | Freq: Once | INTRAVENOUS | Status: AC
  Administered 2011-01-03: 500 mL via INTRAVENOUS
  Filled 2011-01-03: qty 500
  Filled 2011-01-03: qty 1000

## 2011-01-03 MED ORDER — BENZOCAINE-MENTHOL 20-0.5 % EX AERO: 1.0000 "application " | INHALATION_SPRAY | CUTANEOUS | Status: DC | PRN

## 2011-01-03 MED ORDER — ONDANSETRON HCL 4 MG/2ML IJ SOLN: 4.0000 mg | INTRAMUSCULAR | Status: DC | PRN

## 2011-01-03 MED ORDER — BUTORPHANOL TARTRATE 2 MG/ML IJ SOLN: 1.0000 mg | INTRAMUSCULAR | Status: DC | PRN

## 2011-01-03 MED ORDER — PROMETHAZINE HCL 25 MG/ML IJ SOLN: 12.5000 mg | INTRAMUSCULAR | Status: DC | PRN

## 2011-01-03 NOTE — ED Provider Notes (Signed)
I was asked to do a speculum exam to r/o SROM. On exam, there was pooling in the vagina. Fern test positive. Cervix 7/80/-2.  Clinton Gallant. Rice III, DrHSc, MPAS, PA-C   Henrietta Hoover, [place] 01/03/11 1956

## 2011-01-03 NOTE — H&P (Signed)
This is Dr. Francoise Ceo dictating the history and physical on  Alicia Rojas She's a 25 year old gravida 4 para 3003 38 weeks and 4 days EDC 01/13/2011 Positive GBS she received ampicillin on admission Patient states she's been leaking fluid since 10 7 Her cervix is 8 cm 90% vertex -14 Walters ruptured fluid clear Past medical history negative Past surgical history negative Social history negative System review negative Physical exam Well-developed female in no distress HEENT negative Breasts negative Heart regular rhythm no murmurs no gallops Abdomen term Pelvic as described above Extremities negative

## 2011-01-03 NOTE — Progress Notes (Signed)
Pt G4 P3 at 38.4wks, leaking clear fluid since 10/7 @1600 .  Pt reports contractions every 5-6 min.  Vag Del x 3, denies any problems with pregnancy, +GBS.

## 2011-01-03 NOTE — Progress Notes (Signed)
RN continually adjusting EFM.

## 2011-01-03 NOTE — Progress Notes (Signed)
Pt in for labor eval, reports leaking of clear fluid since yesterday at 1600, states she has been continuously leaking since.  Reports ucs q 5 minutes since 1600.  Denies any bleeding.  +FM.

## 2011-01-04 LAB — POCT URINALYSIS DIP (DEVICE)
Bilirubin Urine: NEGATIVE
Glucose, UA: NEGATIVE
Ketones, ur: NEGATIVE
pH: 7.5

## 2011-01-04 LAB — CBC
Hemoglobin: 9.1 g/dL — ABNORMAL LOW (ref 12.0–15.0)
MCH: 28 pg (ref 26.0–34.0)
RBC: 3.25 MIL/uL — ABNORMAL LOW (ref 3.87–5.11)
WBC: 20.6 10*3/uL — ABNORMAL HIGH (ref 4.0–10.5)

## 2011-01-04 LAB — POCT PREGNANCY, URINE
Operator id: 116391
Preg Test, Ur: POSITIVE

## 2011-01-04 MED ORDER — BENZOCAINE-MENTHOL 20-0.5 % EX AERO
INHALATION_SPRAY | CUTANEOUS | Status: AC
  Filled 2011-01-04: qty 56

## 2011-01-04 NOTE — Progress Notes (Signed)
UR Chart review completed.  

## 2011-01-04 NOTE — Progress Notes (Signed)
  Post Partum Day 1 S/P spontaneous vaginal RH status/Rubella reviewed.  Feeding: breast Subjective: No HA, SOB, CP, F/C, breast symptoms. Normal vaginal bleeding, no clots.     Objective: BP 114/78  Pulse 78  Temp(Src) 98.9 F (37.2 C) (Oral)  Resp 18  Ht 5\' 1"  (1.549 m)  Wt 73.483 kg (162 lb)  BMI 30.61 kg/m2  SpO2 99%  Breastfeeding? Unknown   Physical Exam:  General: alert Lochia: appropriate Uterine Fundus: firm DVT Evaluation: No evidence of DVT seen on physical exam. Ext: No c/c/e  Basename 01/04/11 0530 01/03/11 2010  HGB 9.1* 10.4*  HCT 27.9* 32.0*      Assessment/Plan: 25 y.o.  PPD #1 .  normal postpartum exam Continue current postpartum care  Ambulate   LOS: 1 day   Rojas,Alicia Lanter A 01/04/2011, 9:58 AM

## 2011-01-05 MED ORDER — OXYCODONE-ACETAMINOPHEN 5-325 MG PO TABS: 1.0000 | ORAL_TABLET | ORAL | Status: AC | PRN

## 2011-01-05 MED ORDER — FERROUS SULFATE 325 (65 FE) MG PO TABS: 325.0000 mg | ORAL_TABLET | Freq: Two times a day (BID) | ORAL | Status: DC

## 2011-01-05 MED ORDER — PRENATAL RX 60-1 MG PO TABS: 1.0000 | ORAL_TABLET | Freq: Every day | ORAL | Status: AC

## 2011-01-05 MED ORDER — IBUPROFEN 600 MG PO TABS: 600.0000 mg | ORAL_TABLET | Freq: Four times a day (QID) | ORAL | Status: AC | PRN

## 2011-01-05 NOTE — Progress Notes (Signed)
Post Partum Day #2 S/P:spontaneous vaginal  RH status/Rubella reviewed.  Feeding: unknown Subjective: No HA, SOB, CP, F/C, breast symptoms: No. Normal vaginal bleeding, no clots.     Objective:  Blood pressure 125/86, pulse 83, temperature 98.4 F (36.9 C), temperature source Oral, resp. rate 20, height 5\' 1"  (1.549 m), weight 73.483 kg (162 lb), SpO2 99.00%, unknown if currently breastfeeding.   Physical Exam:  General: alert Lochia: appropriate Uterine Fundus: firm DVT Evaluation: No evidence of DVT seen on physical exam. Ext: No c/c/e  Basename 01/04/11 0530 01/03/11 2010  HGB 9.1* 10.4*  HCT 27.9* 32.0*    Assessment/Plan: 25 y.o.  PPD # 2 .  normal postpartum exam Continue current postpartum care D/C home   LOS: 2 days   JACKSON-MOORE,Erandy Mceachern A 01/05/2011, 10:52 AM

## 2011-01-05 NOTE — Discharge Summary (Signed)
  Obstetric Discharge Summary Reason for Admission: onset of labor Prenatal Procedures: none Intrapartum Procedures: spontaneous vaginal delivery Postpartum Procedures: none Complications-Operative and Postpartum: none  Hemoglobin  Date Value Range Status  01/04/2011 9.1* 12.0-15.0 (g/dL) Final     HCT  Date Value Range Status  01/04/2011 27.9* 36.0-46.0 (%) Final    Discharge Diagnoses: Term Pregnancy-delivered  Discharge Information: Date: 01/05/2011 Activity: pelvic rest Diet: routine Medications: Ibuprofen, Percocet,PNV, FeSO4 Condition: stable Instructions: refer to routine discharge instructions Discharge to: home Follow-up Information    Follow up with Antionette Char A, MD. Call in 2 weeks.   Contact information:   [address], [address] [place] [address] [phone]          Newborn Data: Live born  Information for the patient's newborn:  Kassity, Woodson [[id number]]  female ; APGAR , ; weight ;  Home with mother.  JACKSON-MOORE,Dorsel Flinn A 01/05/2011, 10:59 AM

## 2013-02-01 ENCOUNTER — Emergency Department (HOSPITAL_COMMUNITY)
Admission: EM | Admit: 2013-02-01 | Discharge: 2013-02-01 | Disposition: A | Discharge status: Home / Self Care | Payer: Self-pay | Attending: Emergency Medicine | Admitting: Emergency Medicine

## 2013-02-01 ENCOUNTER — Encounter (HOSPITAL_COMMUNITY): Payer: Self-pay | Admitting: Emergency Medicine

## 2013-02-01 ENCOUNTER — Emergency Department (HOSPITAL_COMMUNITY): Payer: Self-pay

## 2013-02-01 DIAGNOSIS — G510 Bell's palsy: Secondary | ICD-10-CM | POA: Insufficient documentation

## 2013-02-01 IMAGING — CT CT HEAD W/O CM
1 series · 16 of 29 positions shown, 20 images · non-contrast
Comparison: None.

CLINICAL DATA: Pain with left-sided facial droop in numbness left
side of face

EXAM:
CT HEAD WITHOUT CONTRAST
TECHNIQUE: Contiguous axial images were obtained from the base of the skull
through the vertex without intravenous contrast. Study was obtained
within 24 hr of patient's arrival at the emergency department.

[Series 2: head 5.0 h30s · axial · 0.41mm/px · z∈[-130,+0]mm · 16 of 29 slices shown, 20 images]
[im 2/29  brain]
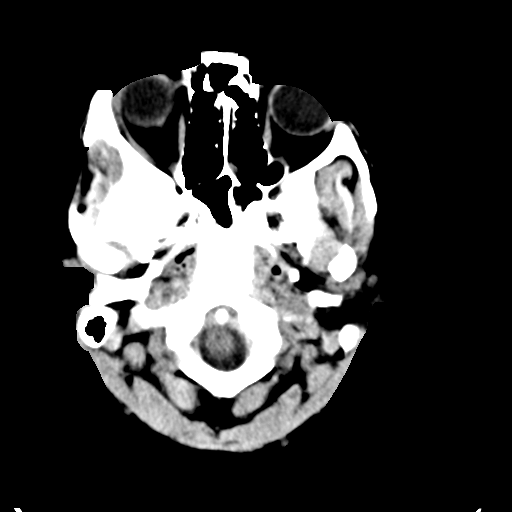
[im 2/29  bone]
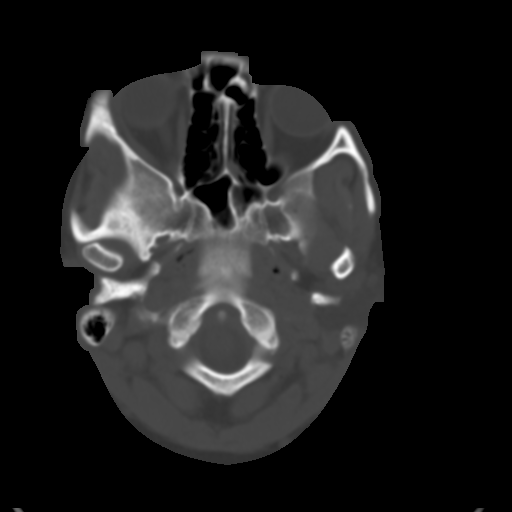
[im 4/29  brain]
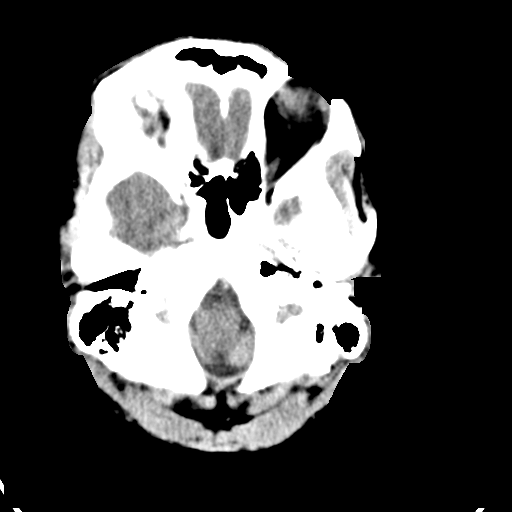
[im 6/29  brain]
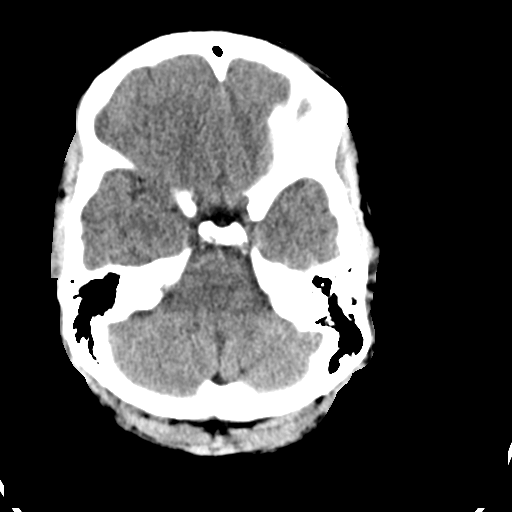
[im 7/29  brain]
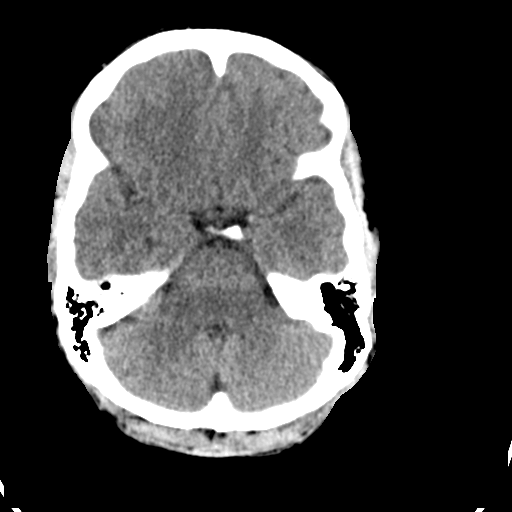
[im 9/29  brain]
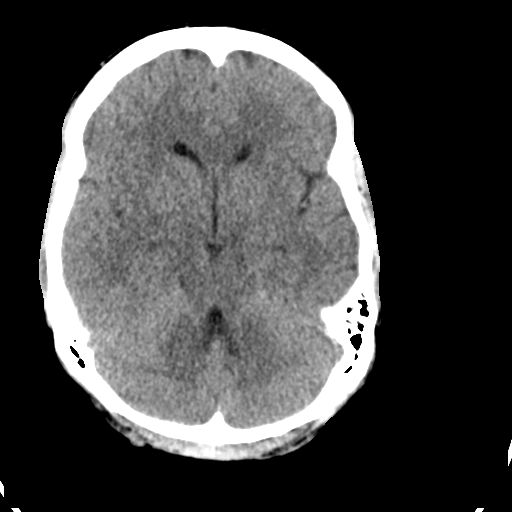
[im 9/29  bone]
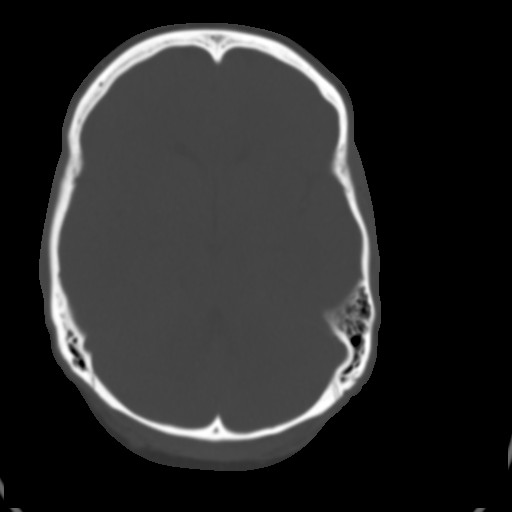
[im 11/29  brain]
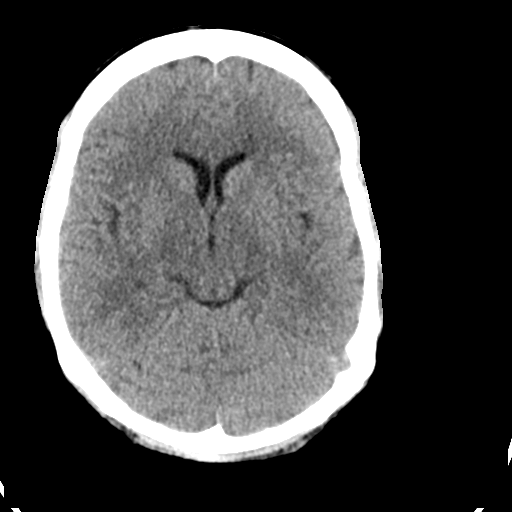
[im 12/29  brain]
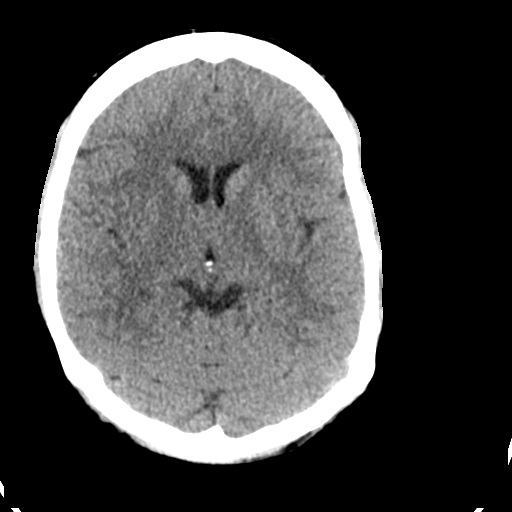
[im 14/29  brain]
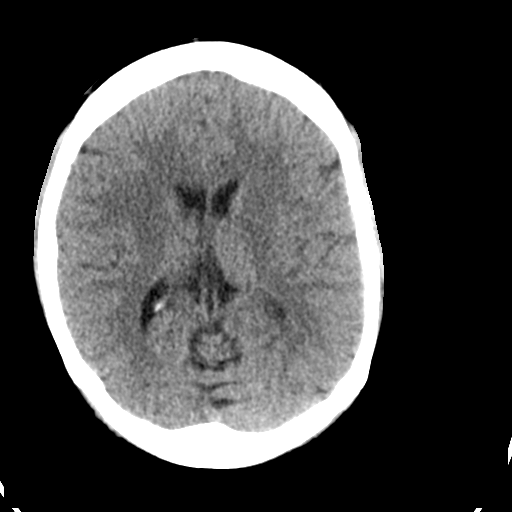
[im 16/29  brain]
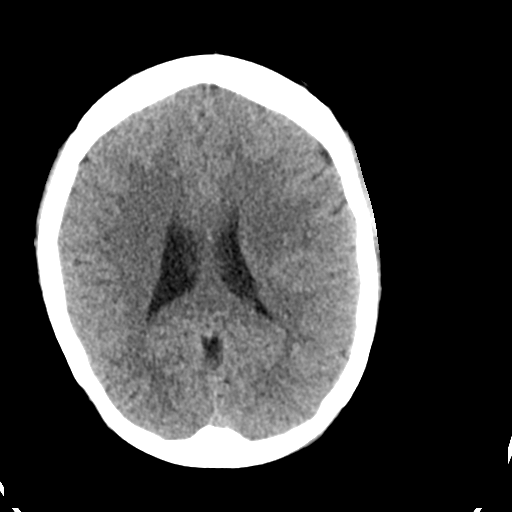
[im 16/29  bone]
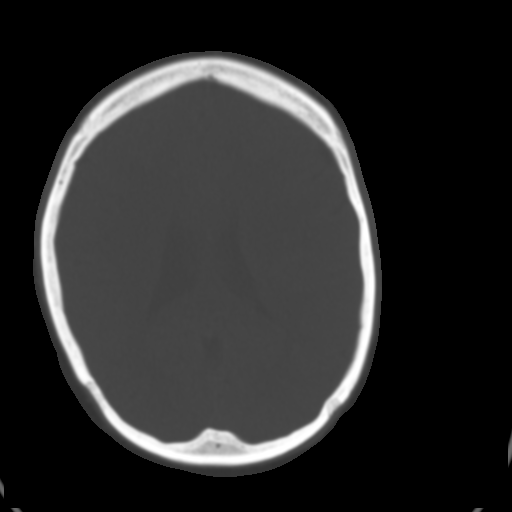
[im 18/29  brain]
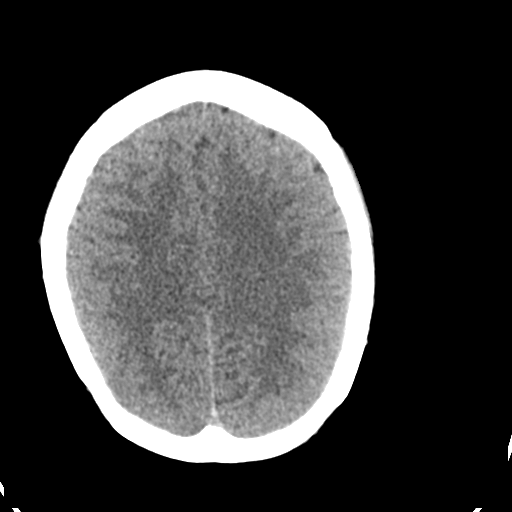
[im 19/29  brain]
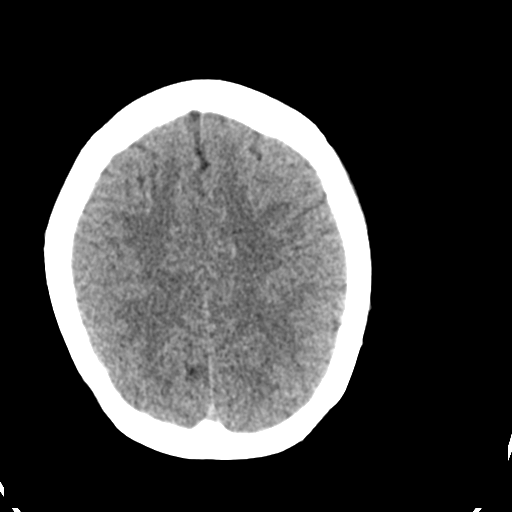
[im 21/29  brain]
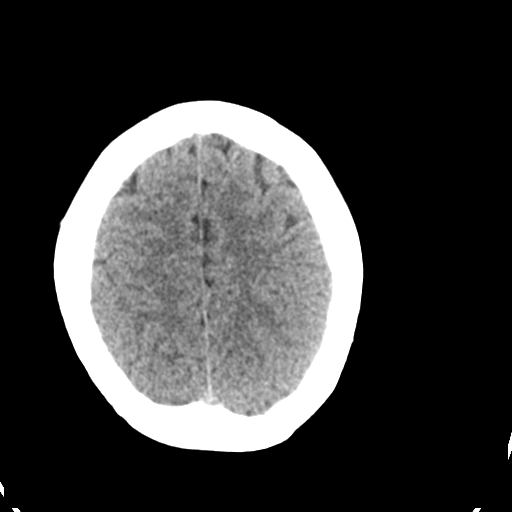
[im 23/29  brain]
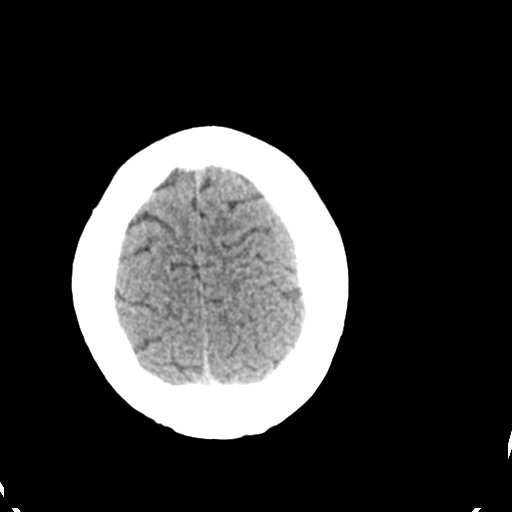
[im 23/29  bone]
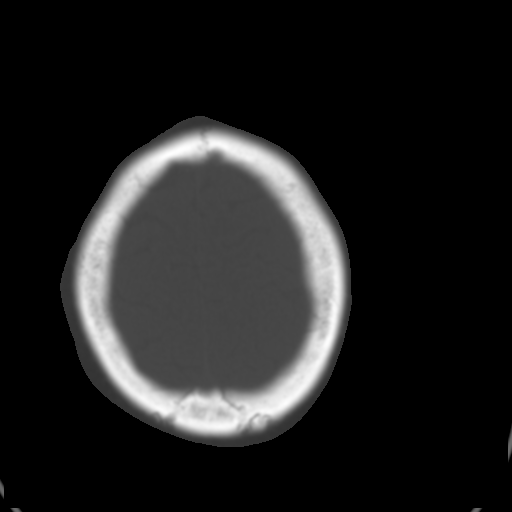
[im 24/29  brain]
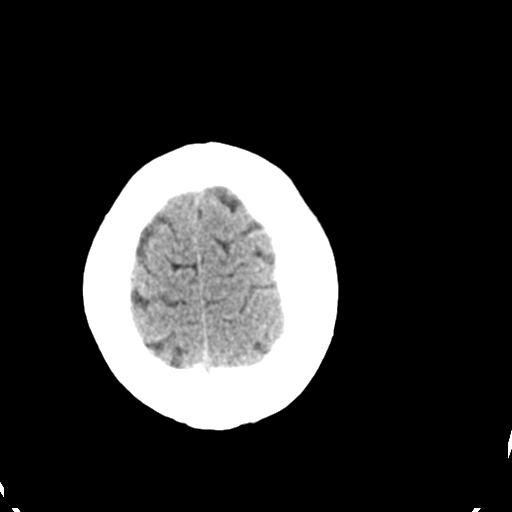
[im 26/29  brain]
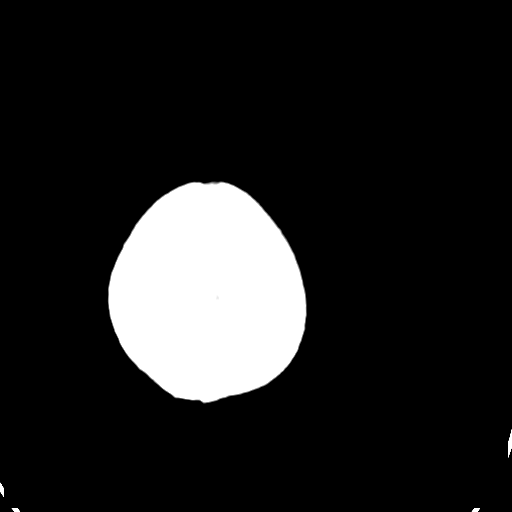
[im 28/29  brain]
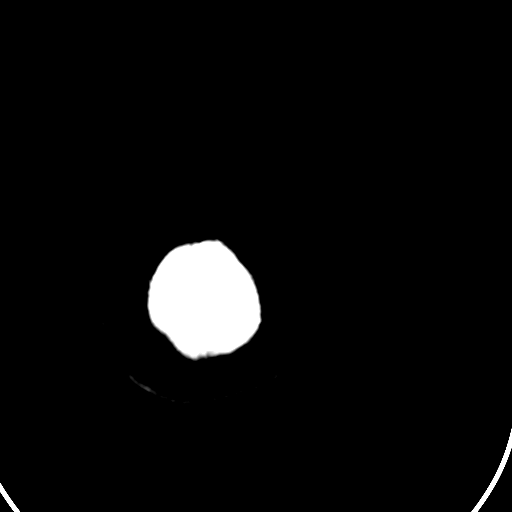

[16 of 29 positions shown; findings below may reference images not displayed]

FINDINGS: The ventricles are normal in size and configuration. There is no
mass, hemorrhage, extra-axial fluid collection, or midline shift.
Gray-white compartments are normal. There is no demonstrable acute
infarct. Bony calvarium appears intact. The mastoid air cells are
clear.
IMPRESSION: Study within normal limits.

## 2013-02-01 MED ORDER — ARTIFICIAL TEARS OP OINT: TOPICAL_OINTMENT | OPHTHALMIC | Status: DC

## 2013-02-01 MED ORDER — PREDNISONE 20 MG PO TABS: ORAL_TABLET | ORAL | Status: DC

## 2013-02-01 NOTE — ED Provider Notes (Signed)
Medical screening examination/treatment/procedure(s) were conducted as a shared visit with non-physician practitioner(s) and myself.  I personally evaluated the patient during the encounter.  EKG Interpretation   None       Pt presents w/ less than 24 hrs of L sided facial weakness.  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 Neuro exam otherwise nml. No risk factors for CVA/TIA. CT head unremarkable.  Will d/c home w/ steroid course.   Shanna Cisco, MD 02/01/13 2224

## 2013-02-01 NOTE — ED Notes (Signed)
Patient states she started having facial numbness, tongue numbness with her eyes throbbing.   Patient states when she walks "I'm all over the place.  Like the room is spinning".

## 2013-02-01 NOTE — ED Notes (Signed)
To ct

## 2013-02-01 NOTE — ED Provider Notes (Signed)
CSN: [id number]     Arrival date & time 02/01/13  0849 History   First MD Initiated Contact with Patient 02/01/13 [phone]     Chief Complaint  Patient presents with  . facial numbness    (Consider location/radiation/quality/duration/timing/severity/associated sxs/prior Treatment) The history is provided by the patient.   Pt presents with left sided facial weakness that began last night.  Reports pain over her left maxilla. Pain is described as throbbing. Denies numbness or tingling of the face.  Denies any other focal neurologic deficits.  Denies fevers, chills, recent illness, dental pain, ear pain, visual changes.  Denies trauma. Denies tick bites.  No medical problems.  No hx herpes simplex, pt denies "cold sores".  No IVDU.   Past Medical History  Diagnosis Date  . No pertinent past medical history    Past Surgical History  Procedure Laterality Date  . Multiple tooth extractions    . No past surgeries     No family history on file. History  Substance Use Topics  . Smoking status: Never Smoker   . Smokeless tobacco: Never Used  . Alcohol Use: No   OB History   Grav Para Term Preterm Abortions TAB SAB Ect Mult Living   4 4 4  0 0 0 0 0 0 4     Review of Systems  Constitutional: Negative for fever and chills.  Respiratory: Negative for cough and shortness of breath.   Cardiovascular: Negative for chest pain.  Gastrointestinal: Negative for nausea, vomiting, abdominal pain and diarrhea.  Genitourinary: Negative for dysuria, urgency, frequency, vaginal bleeding and vaginal discharge.    Allergies  Review of patient's allergies indicates no known allergies.  Home Medications  No current outpatient prescriptions on file. BP 140/70  Pulse 71  Temp(Src) 98.6 F (37 C) (Oral)  Resp 12  SpO2 100%  LMP 01/05/2013 Physical Exam  Nursing note and vitals reviewed. Constitutional: She appears well-developed and well-nourished. No distress.  HENT:  Head: Normocephalic and  atraumatic.  Neck: Neck supple.  Cardiovascular: Normal rate.   Pulmonary/Chest: Effort normal.  Neurological: She is alert.  Left facial droop and weakness V 1-3, pt is able to wrinkle forehead but it is weaker than right, CN II-XII otherwise intact, EOMs intact, no pronator drift, grip strengths equal bilaterally; strength 5/5 in all extremities, sensation intact in all extremities; finger to nose, heel to shin, rapid alternating movements normal; gait is normal.     Skin: She is not diaphoretic.    ED Course  Procedures (including critical care time) Labs Review Labs Reviewed - No data to display Imaging Review No results found.  EKG Interpretation   None      Discussed pt with Dr Micheline Maze  MDM   1. Bell's palsy    Patient also seen and examined by Dr Micheline Maze who confirms diagnosis of Bell's palsy.  Pt with left sided facial weakness that does include left forehead (it is less dramatic than V3 but this is also an area that moves less in general).  Otherwise neurologically intact.  No risk factors for stroke.  D/C home with prednisone and eye ointment per Up To Date recommendations.  Discussed result, findings, treatment, and follow up  with patient.  Pt given resources for PCP follow up. Pt given return precautions.  Pt verbalizes understanding and agrees with plan.        Trixie Dredge, PA-C 02/01/13 1101

## 2013-09-29 ENCOUNTER — Emergency Department (HOSPITAL_COMMUNITY)
Admission: EM | Admit: 2013-09-29 | Discharge: 2013-09-29 | Disposition: A | Discharge status: Home / Self Care | Payer: Medicaid Other | Attending: Emergency Medicine | Admitting: Emergency Medicine

## 2013-09-29 ENCOUNTER — Encounter (HOSPITAL_COMMUNITY): Payer: Self-pay | Admitting: Emergency Medicine

## 2013-09-29 DIAGNOSIS — R197 Diarrhea, unspecified: Secondary | ICD-10-CM | POA: Insufficient documentation

## 2013-09-29 DIAGNOSIS — R51 Headache: Secondary | ICD-10-CM | POA: Insufficient documentation

## 2013-09-29 DIAGNOSIS — R1110 Vomiting, unspecified: Secondary | ICD-10-CM | POA: Insufficient documentation

## 2013-09-29 DIAGNOSIS — J029 Acute pharyngitis, unspecified: Secondary | ICD-10-CM | POA: Insufficient documentation

## 2013-09-29 DIAGNOSIS — Z791 Long term (current) use of non-steroidal anti-inflammatories (NSAID): Secondary | ICD-10-CM | POA: Insufficient documentation

## 2013-09-29 LAB — RAPID STREP SCREEN (MED CTR MEBANE ONLY): Streptococcus, Group A Screen (Direct): NEGATIVE

## 2013-09-29 MED ORDER — KETOROLAC TROMETHAMINE 30 MG/ML IJ SOLN
30.0000 mg | Freq: Once | INTRAMUSCULAR | Status: AC
  Administered 2013-09-29: 30 mg via INTRAMUSCULAR
  Filled 2013-09-29: qty 1

## 2013-09-29 MED ORDER — LIDOCAINE VISCOUS 2 % MT SOLN: 20.0000 mL | OROMUCOSAL | Status: DC | PRN

## 2013-09-29 MED ORDER — LIDOCAINE VISCOUS 2 % MT SOLN
15.0000 mL | Freq: Once | OROMUCOSAL | Status: AC
  Administered 2013-09-29: 15 mL via OROMUCOSAL
  Filled 2013-09-29: qty 15

## 2013-09-29 NOTE — ED Provider Notes (Signed)
CSN: [id number]     Arrival date & time 09/29/13  0848 History  This chart was scribed for non-physician practitioner  Santiago GladHeather Candee Hoon, PA-C, working with Gwyneth SproutWhitney Plunkett, MD, by Yevette EdwardsAngela Bracken, ED Scribe. This patient was seen in room TR07C/TR07C and the patient's care was started at 9:45 AM.  First MD Initiated Contact with Patient 09/29/13 [phone]     Chief Complaint  Patient presents with  . Sore Throat  . Headache    The history is provided by the patient. No language interpreter was used.   HPI Comments: Alicia Rojas is a 28 y.o. female who presents to the Emergency Department complaining of a worsening sore throat which began two days ago and which has been associated with fever, fatigue, a decreased appetite, nausea, emesis, diarrhea, and headache.  She reports the throat pain is increased with swallowing, but she denies difficulty swallowing. The pt reports two episodes of emesis total and two episodes of diarrhea a day; the last episode of emesis was yesterday. She rates the headache as 10/10. Her maximum temperature at home was 103 F; in the ED her temperature is 99.2 F. [name] has treated her symptoms with 800 mg IBU with the last dosage ten hours ago; she reports waning relief with IBU. She denies neck pain/stiffness, abdominal pain, or hematochezia. She also denies known exposure to contacts with strep or mono.   Past Medical History  Diagnosis Date  . No pertinent past medical history    Past Surgical History  Procedure Laterality Date  . Multiple tooth extractions    . No past surgeries     History reviewed. No pertinent family history. History  Substance Use Topics  . Smoking status: Never Smoker   . Smokeless tobacco: Never Used  . Alcohol Use: No   OB History   Grav Para Term Preterm Abortions TAB SAB Ect Mult Living   4 4 4  0 0 0 0 0 0 4     Review of Systems  Constitutional: Positive for fever, appetite change and fatigue.  HENT: Positive for sore  throat. Negative for trouble swallowing and voice change.   Gastrointestinal: Positive for vomiting and diarrhea. Negative for abdominal pain and blood in stool.  Musculoskeletal: Negative for neck pain.  Neurological: Positive for headaches.  All other systems reviewed and are negative.   Allergies  Review of patient's allergies indicates no known allergies.  Home Medications   Prior to Admission medications   Medication Sig Start Date End Date Taking? Authorizing Provider  ibuprofen (ADVIL,MOTRIN) 200 MG tablet Take 800 mg by mouth every 6 (six) hours as needed for fever, headache or mild pain.   Yes Historical Provider, MD   Triage Vitals: BP 139/90  Pulse 100  Temp(Src) 99.2 F (37.3 C) (Oral)  Resp 18  Wt 138 lb 6 oz (62.766 kg)  SpO2 98%  LMP 09/13/2013  Physical Exam  Nursing note and vitals reviewed. Constitutional: She is oriented to person, place, and time. She appears well-developed and well-nourished. No distress.  HENT:  Head: Normocephalic and atraumatic.  Right Ear: Tympanic membrane, external ear and ear canal normal. No drainage or swelling. Tympanic membrane is not injected, not erythematous, not retracted and not bulging.  Left Ear: Tympanic membrane, external ear and ear canal normal. No drainage or swelling. Tympanic membrane is not injected, not erythematous, not retracted and not bulging.  Mouth/Throat: Uvula is midline. No trismus in the jaw. No uvula swelling. Oropharyngeal exudate and posterior oropharyngeal  erythema present.  Tonsils elnarged bilaterally with exudate present.  No trismus Handling secretions well. Normal voice phonation.  No anterior cervical lymphadenopathy.  No tenderness to palpation of cervical lymph nodes.   Eyes: Conjunctivae and EOM are normal. Pupils are equal, round, and reactive to light.  Neck: Normal range of motion. Neck supple. No tracheal deviation present.  Cardiovascular: Normal rate and regular rhythm.    Pulmonary/Chest: Effort normal and breath sounds normal. No respiratory distress. She has no wheezes. She has no rales.  Abdominal: Soft. There is no hepatosplenomegaly. There is no tenderness.  Musculoskeletal: Normal range of motion.  Lymphadenopathy:    She has no cervical adenopathy.  Neurological: She is alert and oriented to person, place, and time. She displays normal reflexes. No cranial nerve deficit. She exhibits normal muscle tone.  Skin: Skin is warm and dry.  Psychiatric: She has a normal mood and affect. Her behavior is normal.    ED Course  Procedures (including critical care time)  DIAGNOSTIC STUDIES:  Oxygen Saturation is 98% on room air, normal by my interpretation.    COORDINATION OF CARE:  9:54 AM- Discussed treatment plan with patient, and the patient agreed to the plan. The plan includes a strep culture. Will prescribe pt antibiotic if strep culture positive. Encouraged pt to increase fluids and continue usage of NSAIDs. Will also give pt viscous lidocaine and Toradol IM.   Labs Review Labs Reviewed  RAPID STREP SCREEN  CULTURE, GROUP A STREP    Imaging Review No results found.   EKG Interpretation None      MDM   Final diagnoses:  None   Patient presents with sore throat and headache that has been present for 2 hours.  Rapid strep negative.  Culture pending.  Patient afebrile in the ED and non toxic appearing.  No signs of retropharyngeal or PTA at this time. Patient tolerating PO liquids.   Feel that the patient is stable for discharge. Return precautions given.    Santiago Glad, PA-C 09/29/13 1221

## 2013-09-29 NOTE — ED Notes (Signed)
Declined W/C at D/C and was escorted to lobby by RN. 

## 2013-09-29 NOTE — ED Notes (Signed)
Pt reports HA and sore throat started on 09-27-13. Pt reports HA pain is a 10/10.

## 2013-09-29 NOTE — ED Provider Notes (Signed)
Medical screening examination/treatment/procedure(s) were performed by non-physician practitioner and as supervising physician I was immediately available for consultation/collaboration.   EKG Interpretation None        Gwyneth Sprout, MD 09/29/13 1402

## 2013-09-29 NOTE — Discharge Instructions (Signed)
As we discussed this could be Mono, but it is too early to test for this.

## 2013-10-01 LAB — CULTURE, GROUP A STREP

## 2013-10-17 ENCOUNTER — Emergency Department (HOSPITAL_COMMUNITY)
Admission: EM | Admit: 2013-10-17 | Discharge: 2013-10-17 | Disposition: A | Discharge status: Home / Self Care | Payer: Medicaid Other | Attending: Emergency Medicine | Admitting: Emergency Medicine

## 2013-10-17 ENCOUNTER — Encounter (HOSPITAL_COMMUNITY): Payer: Self-pay | Admitting: Emergency Medicine

## 2013-10-17 DIAGNOSIS — K0889 Other specified disorders of teeth and supporting structures: Secondary | ICD-10-CM

## 2013-10-17 DIAGNOSIS — H9209 Otalgia, unspecified ear: Secondary | ICD-10-CM | POA: Insufficient documentation

## 2013-10-17 DIAGNOSIS — K089 Disorder of teeth and supporting structures, unspecified: Secondary | ICD-10-CM | POA: Insufficient documentation

## 2013-10-17 DIAGNOSIS — R221 Localized swelling, mass and lump, neck: Secondary | ICD-10-CM

## 2013-10-17 DIAGNOSIS — K029 Dental caries, unspecified: Secondary | ICD-10-CM | POA: Insufficient documentation

## 2013-10-17 DIAGNOSIS — R220 Localized swelling, mass and lump, head: Secondary | ICD-10-CM | POA: Insufficient documentation

## 2013-10-17 DIAGNOSIS — R6884 Jaw pain: Secondary | ICD-10-CM | POA: Insufficient documentation

## 2013-10-17 MED ORDER — HYDROCODONE-ACETAMINOPHEN 5-325 MG PO TABS: ORAL_TABLET | ORAL | Status: DC

## 2013-10-17 MED ORDER — NAPROXEN 500 MG PO TABS: 500.0000 mg | ORAL_TABLET | Freq: Two times a day (BID) | ORAL | Status: DC

## 2013-10-17 MED ORDER — PENICILLIN V POTASSIUM 500 MG PO TABS: 500.0000 mg | ORAL_TABLET | Freq: Three times a day (TID) | ORAL | Status: DC

## 2013-10-17 NOTE — ED Provider Notes (Signed)
Medical screening examination/treatment/procedure(s) were performed by non-physician practitioner and as supervising physician I was immediately available for consultation/collaboration.  Toy Baker, MD 10/17/13 [phone]

## 2013-10-17 NOTE — ED Provider Notes (Signed)
CSN: [id number]     Arrival date & time 10/17/13  1749 History  This chart was scribed for Rhea Bleacher, PA, working with Toy Baker, MD by Chestine Spore, ED Scribe. The patient was seen in room WTR7/WTR7 at 6:26 PM.     Chief Complaint  Patient presents with  . Dental Pain    The history is provided by the patient. No language interpreter was used.   HPI Comments: Alicia Rojas is a 28 y.o. female who presents to the Emergency Department complaining of left upper and lower dental pain onset 3 days ago. She states that the dental pain has been giving her a problem for awhile now and for the past three days the pain has become consistent. She states that she has taken Aleve, Ibuprofen with no relief for her symptoms. She states that those medications worked before 3 days ago.  She states that she does not have insurance and is unable to get the tooth taken care of. She states that she is having associated symptoms of Jaw pain, Facial swelling, Ear pain.   She denies any other associated symptoms. She states that she is not allergic to any medications.    Past Medical History  Diagnosis Date  . No pertinent past medical history    Past Surgical History  Procedure Laterality Date  . Multiple tooth extractions    . No past surgeries     History reviewed. No pertinent family history. History  Substance Use Topics  . Smoking status: Never Smoker   . Smokeless tobacco: Never Used  . Alcohol Use: No   OB History   Grav Para Term Preterm Abortions TAB SAB Ect Mult Living   4 4 4  0 0 0 0 0 0 4     Review of Systems  Constitutional: Negative for fever.  HENT: Positive for dental problem, ear pain and facial swelling. Negative for sore throat and trouble swallowing.        Left jaw pain  Respiratory: Negative for shortness of breath and stridor.   Musculoskeletal: Negative for neck pain.  Skin: Negative for color change.  Neurological: Negative for headaches.    Allergies   Review of patient's allergies indicates no known allergies.  Home Medications   Prior to Admission medications   Medication Sig Start Date End Date Taking? Authorizing Provider  ibuprofen (ADVIL,MOTRIN) 200 MG tablet Take 800 mg by mouth every 6 (six) hours as needed for fever, headache or mild pain.   Yes Historical Provider, MD   BP 148/96  Pulse 64  Temp(Src) 99.1 F (37.3 C) (Oral)  Resp 16  SpO2 100%  LMP 10/07/2013  Physical Exam  Nursing note and vitals reviewed. Constitutional: She appears well-developed and well-nourished.  HENT:  Head: Normocephalic and atraumatic.  Right Ear: Tympanic membrane, external ear and ear canal normal.  Left Ear: Tympanic membrane, external ear and ear canal normal.  Nose: Nose normal.  Mouth/Throat: Uvula is midline, oropharynx is clear and moist and mucous membranes are normal. No trismus in the jaw. Abnormal dentition. Dental caries present. No dental abscesses or uvula swelling. No tonsillar abscesses.  Patient with L maxillar tooth pain and tenderness to palpation in area of 2nd molar. Mild swelling or erythema noted on exam. No palpable or gross abscess.   Eyes: Conjunctivae are normal.  Neck: Normal range of motion. Neck supple.  No neck swelling or Ludwig's angina  Lymphadenopathy:    She has no cervical adenopathy.  Neurological:  She is alert.  Skin: Skin is warm and dry.  Psychiatric: She has a normal mood and affect.    ED Course  Procedures (including critical care time)    COORDINATION OF CARE: 6:31 PM-Discussed treatment plan which includes Hydrocodone, Naprosyn, and Penicillin with pt at bedside and pt agreed to plan.   Labs Review Labs Reviewed - No data to display  Imaging Review No results found.   EKG Interpretation None      6:45 PM Patient seen and examined.   Vital signs reviewed and are as follows: Filed Vitals:   10/17/13 1852  BP: 148/96  Pulse: 64  Temp: 99.1 F (37.3 C)  Resp: 16    Patient counseled on use of narcotic pain medications. Counseled not to combine these medications with others containing tylenol. Urged not to drink alcohol, drive, or perform any other activities that requires focus while taking these medications. The patient verbalizes understanding and agrees with the plan.  Patient counseled to take prescribed medications as directed, return with worsening facial or neck swelling, and to follow-up with their dentist as soon as possible.    MDM   Final diagnoses:  Pain, dental   Patient with toothache. No fever. Exam unconcerning for Ludwig's angina or other deep tissue infection in neck.   As there is gum swelling, erythema, and facial swelling, will treat with antibiotic and pain medicine. Urged patient to follow-up with dentist.    I personally performed the services described in this documentation, which was scribed in my presence. The recorded information has been reviewed and is accurate.    Renne CriglerJoshua Jullianna Gabor, PA-C 10/17/13 1913

## 2013-10-17 NOTE — Discharge Instructions (Signed)
Please read and follow all provided instructions.  Your diagnoses today include:  1. Pain, dental     The exam and treatment you received today has been provided on an emergency basis only. This is not a substitute for complete medical or dental care.  Tests performed today include:  Vital signs. See below for your results today.   Medications prescribed:   Vicodin (hydrocodone/acetaminophen) - narcotic pain medication  DO NOT drive or perform any activities that require you to be awake and alert because this medicine can make you drowsy. BE VERY CAREFUL not to take multiple medicines containing Tylenol (also called acetaminophen). Doing so can lead to an overdose which can damage your liver and cause liver failure and possibly death.   Naproxen - anti-inflammatory pain medication  Do not exceed 500mg  naproxen every 12 hours, take with food  You have been prescribed an anti-inflammatory medication or NSAID. Take with food. Take smallest effective dose for the shortest duration needed for your pain. Stop taking if you experience stomach pain or vomiting.    Penicillin - antibiotic  You have been prescribed an antibiotic medicine: take the entire course of medicine even if you are feeling better. Stopping early can cause the antibiotic not to work.  Take any prescribed medications only as directed.  Home care instructions:  Follow any educational materials contained in this packet.  Follow-up instructions: Please follow-up with your dentist for further evaluation of your symptoms.   Dental Assistance: See below for dental referrals  Return instructions:   Please return to the Emergency Department if you experience worsening symptoms.  Please return if you develop a fever, you develop more swelling in your face or neck, you have trouble breathing or swallowing food.  Please return if you have any other emergent concerns.  Additional Information:  Your vital signs today  were: LMP 10/07/2013 If your blood pressure (BP) was elevated above 135/85 this visit, please have this repeated by your doctor within one month. -------------- Dental Care: Organization         Address  Phone  Notes  [facility] Department of [facility] [address] [place], [place] [phone] Accepts children up to age 76 who are enrolled in [place] or [place] Health Choice; pregnant women with a Medicaid card; and children who have applied for Medicaid or [place] Health Choice, but were declined, whose parents can pay a reduced fee at time of service.  [facility] Department of [facility]  [address], [place] [phone] Accepts children up to age 3 who are enrolled in [place] or [place] Health Choice; pregnant women with a Medicaid card; and children who have applied for Medicaid or [place] Health Choice, but were declined, whose parents can pay a reduced fee at time of service.  [facility]  [address] [place], [place] [phone] Patients are seen by appointment only. Walk-ins are not accepted. [facility] will see patients 58 years of age and older. Monday - Tuesday (8am-5pm) Most Wednesdays (8:30-5pm) $30 per visit, cash only  [facility]  [address], [facility] [phone] Patients are seen by appointment only. Walk-ins are not accepted. [facility] will see patients 46 years of age and older. One Wednesday Evening (Monthly: Volunteer Based).  $30 per visit, cash only  [employer]  [phone] for adults; Children under age 56, call Graduate Pediatric Dentistry  at [phone]. Children aged 604-14, please call [phone] to request a pediatric application.  Dental services are provided in all areas of dental care including fillings, crowns and bridges, complete and partial dentures, implants, gum treatment, root canals, and  extractions. Preventive care is also provided. Treatment is provided to both adults and children. Patients are selected via a lottery and there is often a waiting list.   [facility] [address], [place]  [phone] [email or web address]   [facility] [address], [place], [place] [phone], Ext. [phone] Second and Fourth Thursday of each month, opens at 6:30 AM; Clinic ends at 9 AM.  Patients are seen on a first-come first-served basis, and a limited number are seen during each clinic.   [facility]  [address], [place], [place] [phone]   Eligibility Requirements You must have lived in [place], [place], or [place] counties for at least the last three months.   You cannot be eligible for state or federal sponsored [place] insurance, including [employer], [place], or [employer]icare.   You generally cannot be eligible for healthcare insurance through your employer.    How to apply: Eligibility screenings are held every Tuesday and Wednesday afternoon from 1:00 pm until 4:00 pm. You do not need an appointment for the interview!  [facility] [address], [place], [place] [phone]   [facility]  [phone]   [facility]  [phone]   [facility]  [phone]

## 2013-10-17 NOTE — ED Notes (Signed)
Per pt, having difficulty with tooth for a while. No insurance and unable to get tooth taken care of.  Facial/jaw pain swelling to left side.

## 2013-11-13 ENCOUNTER — Emergency Department (HOSPITAL_COMMUNITY)
Admission: EM | Admit: 2013-11-13 | Discharge: 2013-11-14 | Disposition: A | Discharge status: Home / Self Care | Payer: Medicaid Other | Attending: Emergency Medicine | Admitting: Emergency Medicine

## 2013-11-13 ENCOUNTER — Encounter (HOSPITAL_COMMUNITY): Payer: Self-pay | Admitting: Emergency Medicine

## 2013-11-13 DIAGNOSIS — A5901 Trichomonal vulvovaginitis: Secondary | ICD-10-CM | POA: Insufficient documentation

## 2013-11-13 DIAGNOSIS — R197 Diarrhea, unspecified: Secondary | ICD-10-CM | POA: Insufficient documentation

## 2013-11-13 DIAGNOSIS — A599 Trichomoniasis, unspecified: Secondary | ICD-10-CM

## 2013-11-13 DIAGNOSIS — Z3202 Encounter for pregnancy test, result negative: Secondary | ICD-10-CM | POA: Insufficient documentation

## 2013-11-13 DIAGNOSIS — N760 Acute vaginitis: Secondary | ICD-10-CM | POA: Insufficient documentation

## 2013-11-13 DIAGNOSIS — R112 Nausea with vomiting, unspecified: Secondary | ICD-10-CM | POA: Insufficient documentation

## 2013-11-13 DIAGNOSIS — B9689 Other specified bacterial agents as the cause of diseases classified elsewhere: Secondary | ICD-10-CM

## 2013-11-13 DIAGNOSIS — R109 Unspecified abdominal pain: Secondary | ICD-10-CM | POA: Insufficient documentation

## 2013-11-13 DIAGNOSIS — R6883 Chills (without fever): Secondary | ICD-10-CM | POA: Insufficient documentation

## 2013-11-13 LAB — URINE MICROSCOPIC-ADD ON

## 2013-11-13 LAB — URINALYSIS, ROUTINE W REFLEX MICROSCOPIC
Bilirubin Urine: NEGATIVE
Glucose, UA: NEGATIVE mg/dL
Ketones, ur: >80 mg/dL — AB
NITRITE: NEGATIVE
PROTEIN: NEGATIVE mg/dL
Specific Gravity, Urine: 1.026 (ref 1.005–1.030)
UROBILINOGEN UA: 1 mg/dL (ref 0.0–1.0)
pH: 6 (ref 5.0–8.0)

## 2013-11-13 LAB — CBC WITH DIFFERENTIAL/PLATELET
BASOS ABS: 0 10*3/uL (ref 0.0–0.1)
Basophils Relative: 0 % (ref 0–1)
Eosinophils Absolute: 0.1 10*3/uL (ref 0.0–0.7)
Eosinophils Relative: 1 % (ref 0–5)
HCT: 40.2 % (ref 36.0–46.0)
HEMOGLOBIN: 13.7 g/dL (ref 12.0–15.0)
LYMPHS PCT: 25 % (ref 12–46)
Lymphs Abs: 1.9 10*3/uL (ref 0.7–4.0)
MCH: 29.4 pg (ref 26.0–34.0)
MCHC: 34.1 g/dL (ref 30.0–36.0)
MCV: 86.3 fL (ref 78.0–100.0)
MONOS PCT: 8 % (ref 3–12)
Monocytes Absolute: 0.6 10*3/uL (ref 0.1–1.0)
NEUTROS ABS: 5.1 10*3/uL (ref 1.7–7.7)
NEUTROS PCT: 66 % (ref 43–77)
Platelets: 388 10*3/uL (ref 150–400)
RBC: 4.66 MIL/uL (ref 3.87–5.11)
RDW: 14.3 % (ref 11.5–15.5)
WBC: 7.7 10*3/uL (ref 4.0–10.5)

## 2013-11-13 LAB — WET PREP, GENITAL: YEAST WET PREP: NONE SEEN

## 2013-11-13 LAB — COMPREHENSIVE METABOLIC PANEL
ALBUMIN: 4.3 g/dL (ref 3.5–5.2)
ALK PHOS: 42 U/L (ref 39–117)
ALT: 13 U/L (ref 0–35)
AST: 15 U/L (ref 0–37)
Anion gap: 14 (ref 5–15)
BILIRUBIN TOTAL: 0.5 mg/dL (ref 0.3–1.2)
BUN: 9 mg/dL (ref 6–23)
CHLORIDE: 99 meq/L (ref 96–112)
CO2: 25 meq/L (ref 19–32)
Calcium: 9.4 mg/dL (ref 8.4–10.5)
Creatinine, Ser: 0.62 mg/dL (ref 0.50–1.10)
GFR calc Af Amer: >90 mL/min (ref >90)
GFR calc non Af Amer: >90 mL/min (ref >90)
Glucose, Bld: 76 mg/dL (ref 70–99)
POTASSIUM: 3.5 meq/L — AB (ref 3.7–5.3)
Sodium: 138 mEq/L (ref 137–147)
Total Protein: 7.8 g/dL (ref 6.0–8.3)

## 2013-11-13 LAB — POC URINE PREG, ED: PREG TEST UR: NEGATIVE

## 2013-11-13 LAB — LIPASE, BLOOD: Lipase: 60 U/L — ABNORMAL HIGH (ref 11–59)

## 2013-11-13 MED ORDER — LIDOCAINE HCL 2 % IJ SOLN
5.0000 mL | Freq: Once | INTRAMUSCULAR | Status: AC
  Administered 2013-11-13: 0.9 mg

## 2013-11-13 MED ORDER — CEFTRIAXONE SODIUM 250 MG IJ SOLR
250.0000 mg | Freq: Once | INTRAMUSCULAR | Status: AC
  Administered 2013-11-13: 250 mg via INTRAMUSCULAR
  Filled 2013-11-13: qty 250

## 2013-11-13 MED ORDER — AZITHROMYCIN 250 MG PO TABS
1000.0000 mg | ORAL_TABLET | Freq: Once | ORAL | Status: AC
  Administered 2013-11-13: 1000 mg via ORAL
  Filled 2013-11-13: qty 4

## 2013-11-13 MED ORDER — SODIUM CHLORIDE 0.9 % IV BOLUS (SEPSIS)
1000.0000 mL | Freq: Once | INTRAVENOUS | Status: AC
  Administered 2013-11-13: 1000 mL via INTRAVENOUS

## 2013-11-13 MED ORDER — METRONIDAZOLE 500 MG PO TABS: 500.0000 mg | ORAL_TABLET | Freq: Two times a day (BID) | ORAL | Status: DC

## 2013-11-13 MED ORDER — OXYCODONE-ACETAMINOPHEN 5-325 MG PO TABS
1.0000 | ORAL_TABLET | Freq: Once | ORAL | Status: AC
  Administered 2013-11-13: 1 via ORAL
  Filled 2013-11-13: qty 1

## 2013-11-13 MED ORDER — AZITHROMYCIN 250 MG PO TABS
1000.0000 mg | ORAL_TABLET | Freq: Once | ORAL | Status: DC
  Filled 2013-11-13: qty 4

## 2013-11-13 MED ORDER — SODIUM CHLORIDE 0.9 % IV BOLUS (SEPSIS)
500.0000 mL | Freq: Once | INTRAVENOUS | Status: AC
  Administered 2013-11-13: 500 mL via INTRAVENOUS

## 2013-11-13 MED ORDER — ONDANSETRON 4 MG PO TBDP: 4.0000 mg | ORAL_TABLET | Freq: Three times a day (TID) | ORAL | Status: DC | PRN

## 2013-11-13 MED ORDER — ONDANSETRON HCL 4 MG/2ML IJ SOLN
4.0000 mg | Freq: Once | INTRAMUSCULAR | Status: AC
  Administered 2013-11-13: 4 mg via INTRAVENOUS
  Filled 2013-11-13: qty 2

## 2013-11-13 MED ORDER — ONDANSETRON HCL 4 MG/2ML IJ SOLN
4.0000 mg | Freq: Once | INTRAMUSCULAR | Status: AC
Start: 2013-11-13 — End: 2013-11-13
  Administered 2013-11-13: 4 mg via INTRAVENOUS
  Filled 2013-11-13: qty 2

## 2013-11-13 NOTE — ED Provider Notes (Signed)
CSN: [id number]     Arrival date & time 11/13/13  1559 History   First MD Initiated Contact with Patient 11/13/13 2024     Chief Complaint  Patient presents with  . Emesis  . Abdominal Pain     (Consider location/radiation/quality/duration/timing/severity/associated sxs/prior Treatment) HPI Comments: Patient is a G63P6 28 yo F presenting to the ED for three days of nausea, multiple episodes of emesis, a few episodes of non-bloody diarrhea, chills w/o documented fever. Patient endorses occasional blood tinged emesis the last day. Also endorses posttussive epigastric burning discomfort. No alleviating or aggravating factors. Decreased PO intake. Patient is also complaining of vaginal discharge w/o pain x 3 days. No unprotected sexual intercourse in the last month. No abdominal surgical history.  Patient is a 28 y.o. female presenting with vomiting and abdominal pain.  Emesis Associated symptoms: abdominal pain, chills and diarrhea   Abdominal Pain Associated symptoms: chills, diarrhea, nausea, vaginal discharge and vomiting   Associated symptoms: no vaginal bleeding     Past Medical History  Diagnosis Date  . No pertinent past medical history    Past Surgical History  Procedure Laterality Date  . Multiple tooth extractions    . No past surgeries     No family history on file. History  Substance Use Topics  . Smoking status: Never Smoker   . Smokeless tobacco: Never Used  . Alcohol Use: No   OB History   Grav Para Term Preterm Abortions TAB SAB Ect Mult Living   4 4 4  0 0 0 0 0 0 4     Review of Systems  Constitutional: Positive for chills.  Gastrointestinal: Positive for nausea, vomiting, abdominal pain and diarrhea.  Genitourinary: Positive for vaginal discharge. Negative for vaginal bleeding and vaginal pain.  All other systems reviewed and are negative.     Allergies  Review of patient's allergies indicates no known allergies.  Home Medications   Prior to  Admission medications   Medication Sig Start Date End Date Taking? Authorizing Provider  metroNIDAZOLE (FLAGYL) 500 MG tablet Take 1 tablet (500 mg total) by mouth 2 (two) times daily. 11/13/13   Jashayla Glatfelter L Ferol Laiche, PA-C  ondansetron (ZOFRAN ODT) 4 MG disintegrating tablet Take 1 tablet (4 mg total) by mouth every 8 (eight) hours as needed for nausea or vomiting. 11/13/13   Lise Auer Sebastian Dzik, PA-C   BP 111/70  Pulse 64  Temp(Src) 98.6 F (37 C)  Resp 20  Ht 5\' 2"  (1.575 m)  Wt 143 lb (64.864 kg)  BMI 26.15 kg/m2  SpO2 100%  LMP 10/07/2013 Physical Exam  Nursing note and vitals reviewed. Constitutional: She is oriented to person, place, and time. She appears well-developed and well-nourished. No distress.  HENT:  Head: Normocephalic and atraumatic.  Right Ear: External ear normal.  Left Ear: External ear normal.  Nose: Nose normal.  Mouth/Throat: Oropharynx is clear and moist. No oropharyngeal exudate.  Eyes: Conjunctivae are normal.  Neck: Neck supple.  Cardiovascular: Normal rate, regular rhythm and normal heart sounds.   Pulmonary/Chest: Effort normal and breath sounds normal. No respiratory distress.  Abdominal: Soft. Bowel sounds are normal. She exhibits no distension. There is no tenderness. There is no rebound and no guarding.  Musculoskeletal: Normal range of motion.  Neurological: She is alert and oriented to person, place, and time.  Skin: Skin is warm and dry.   Exam performed by Francee Piccolo L,  exam chaperoned Date: 11/13/2013 Pelvic exam: normal external genitalia without evidence of trauma.  VULVA: normal appearing vulva with no masses, tenderness or lesion. VAGINA: normal appearing vagina with normal color and discharge, no lesions. CERVIX: normal appearing cervix without lesions, cervical motion tenderness absent, cervical os closed with out purulent discharge; vaginal discharge - green and malodorous, Wet prep and DNA probe for chlamydia and GC  obtained.   ADNEXA: normal adnexa in size, nontender and no masses UTERUS: uterus is normal size, shape, consistency and nontender.    ED Course  Procedures (including critical care time) Medications  sodium chloride 0.9 % bolus 1,000 mL (0 mLs Intravenous Stopped 11/13/13 2307)  ondansetron (ZOFRAN) injection 4 mg (4 mg Intravenous Given 11/13/13 2113)  oxyCODONE-acetaminophen (PERCOCET/ROXICET) 5-325 MG per tablet 1 tablet (1 tablet Oral Given 11/13/13 2121)  cefTRIAXone (ROCEPHIN) injection 250 mg (250 mg Intramuscular Given 11/13/13 2308)  lidocaine (XYLOCAINE) 2 % (with pres) injection 100 mg (0.9 mg Other Given 11/13/13 2308)  sodium chloride 0.9 % bolus 500 mL (0 mLs Intravenous Stopped 11/13/13 2344)  ondansetron (ZOFRAN) injection 4 mg (4 mg Intravenous Given 11/13/13 2306)  azithromycin (ZITHROMAX) tablet 1,000 mg (1,000 mg Oral Given 11/13/13 2307)    Labs Review Labs Reviewed  WET PREP, GENITAL - Abnormal; Notable for the following:    Trich, Wet Prep MANY (*)    Clue Cells Wet Prep HPF POC MODERATE (*)    WBC, Wet Prep HPF POC MODERATE (*)    All other components within normal limits  URINALYSIS, ROUTINE W REFLEX MICROSCOPIC - Abnormal; Notable for the following:    APPearance CLOUDY (*)    Hgb urine dipstick SMALL (*)    Ketones, ur >80 (*)    Leukocytes, UA SMALL (*)    All other components within normal limits  URINE MICROSCOPIC-ADD ON - Abnormal; Notable for the following:    Squamous Epithelial / LPF MANY (*)    All other components within normal limits  COMPREHENSIVE METABOLIC PANEL - Abnormal; Notable for the following:    Potassium 3.5 (*)    All other components within normal limits  LIPASE, BLOOD - Abnormal; Notable for the following:    Lipase 60 (*)    All other components within normal limits  GC/CHLAMYDIA PROBE AMP  CBC WITH DIFFERENTIAL  HIV ANTIBODY (ROUTINE TESTING)  POC URINE PREG, ED    Imaging Review No results found.   EKG  Interpretation None      Patient tolerated PO intake w/o emesis.   MDM   Final diagnoses:  Nausea vomiting and diarrhea  Bacterial vaginosis  Trichomonas infection    Filed Vitals:   11/13/13 2301  BP: 111/70  Pulse: 64  Temp:   Resp:    Afebrile, NAD, non-toxic appearing, AAOx4.   1) Nausea, vomiting, diarrhea: Patient with symptoms consistent with viral gastroenteritis.  Vitals are stable, no fever.  No signs of dehydration, tolerating PO fluids > 6 oz.  Lungs are clear.  No focal abdominal pain, no concern for appendicitis, cholecystitis, pancreatitis, ruptured viscus, UTI, kidney stone, or any other abdominal etiology.  Supportive therapy indicated with return if symptoms worsen.  Patient counseled.   2) BV, Trichomonas: Patient to be discharged with instructions to follow up with OBGYN. Pt understands GC/Chlamydia cultures pending and that they will need to inform all sexual partners within the last 6 months if results return positive. Pt has been treated prophylacticly with azithromycin and rocephin due to pts history, pelvic exam, and wet prep with increased WBCs. Pt advised that she will receive a call  in 48 hours if the test is positive and to refrain from sexual activity for 48 hours. If the test is positive, pt is advised to refrain from sexual activity for 10 days for the medicine to take effect.  Pt not concerning for PID because hemodynamically stable and no cervical motion tenderness on pelvic exam. Pt has also been treated with flagyl for Bacterial Vaginosis. Pt has been advised to not drink alcohol while on this medication.   Return precautions discussed. Patient is agreeable to plan. Patient is stable at time of discharge         Jeannetta EllisJennifer L Sukhraj Esquivias, PA-C 11/14/13 0045

## 2013-11-13 NOTE — Discharge Instructions (Signed)
Please follow up with your primary care physician in 1-2 days. If you do not have one please call the [facility] and wellness Center number listed above. Please follow up with your Ob/Gyn, or one from [facility] to schedule a follow up appointment. Please take your antibiotic until completion. Someone will call you regarding a positive STD check in 48-72 hours, if it is positive please refrain from having sex for 10 days to allow antibiotics to work. Please read all discharge instructions and return precautions.   Viral Gastroenteritis Viral gastroenteritis is also known as stomach flu. This condition affects the stomach and intestinal tract. It can cause sudden diarrhea and vomiting. The illness typically lasts 3 to 8 days. Most people develop an immune response that eventually gets rid of the virus. While this natural response develops, the virus can make you quite ill. CAUSES  Many different viruses can cause gastroenteritis, such as rotavirus or noroviruses. You can catch one of these viruses by consuming contaminated food or water. You may also catch a virus by sharing utensils or other personal items with an infected person or by touching a contaminated surface. SYMPTOMS  The most common symptoms are diarrhea and vomiting. These problems can cause a severe loss of body fluids (dehydration) and a body salt (electrolyte) imbalance. Other symptoms may include:  Fever.  Headache.  Fatigue.  Abdominal pain. DIAGNOSIS  Your caregiver can usually diagnose viral gastroenteritis based on your symptoms and a physical exam. A stool sample may also be taken to test for the presence of viruses or other infections. TREATMENT  This illness typically goes away on its own. Treatments are aimed at rehydration. The most serious cases of viral gastroenteritis involve vomiting so severely that you are not able to keep fluids down. In these cases, fluids must be given through an intravenous line  (IV). HOME CARE INSTRUCTIONS   Drink enough fluids to keep your urine clear or pale yellow. Drink small amounts of fluids frequently and increase the amounts as tolerated.  Ask your caregiver for specific rehydration instructions.  Avoid:  Foods high in sugar.  Alcohol.  Carbonated drinks.  Tobacco.  Juice.  Caffeine drinks.  Extremely hot or cold fluids.  Fatty, greasy foods.  Too much intake of anything at one time.  Dairy products until 24 to 48 hours after diarrhea stops.  You may consume probiotics. Probiotics are active cultures of beneficial bacteria. They may lessen the amount and number of diarrheal stools in adults. Probiotics can be found in yogurt with active cultures and in supplements.  Wash your hands well to avoid spreading the virus.  Only take over-the-counter or prescription medicines for pain, discomfort, or fever as directed by your caregiver. Do not give aspirin to children. Antidiarrheal medicines are not recommended.  Ask your caregiver if you should continue to take your regular prescribed and over-the-counter medicines.  Keep all follow-up appointments as directed by your caregiver. SEEK IMMEDIATE MEDICAL CARE IF:   You are unable to keep fluids down.  You do not urinate at least once every 6 to 8 hours.  You develop shortness of breath.  You notice blood in your stool or vomit. This may look like coffee grounds.  You have abdominal pain that increases or is concentrated in one small area (localized).  You have persistent vomiting or diarrhea.  You have a fever.  The patient is a child younger than 3 months, and he or she has a fever.  The patient is  a child older than 3 months, and he or she has a fever and persistent symptoms.  The patient is a child older than 3 months, and he or she has a fever and symptoms suddenly get worse.  The patient is a baby, and he or she has no tears when crying. MAKE SURE YOU:   Understand these  instructions.  Will watch your condition.  Will get help right away if you are not doing well or get worse. Document Released: 03/14/2005 Document Revised: 06/06/2011 Document Reviewed: 12/29/2010 [facility] Patient Information 2015 [place], [place]. This information is not intended to replace advice given to you by your health care provider. Make sure you discuss any questions you have with your health care provider.   Bacterial Vaginosis Bacterial vaginosis is a vaginal infection that occurs when the normal balance of bacteria in the vagina is disrupted. It results from an overgrowth of certain bacteria. This is the most common vaginal infection in women of childbearing age. Treatment is important to prevent complications, especially in pregnant women, as it can cause a premature delivery. CAUSES  Bacterial vaginosis is caused by an increase in harmful bacteria that are normally present in smaller amounts in the vagina. Several different kinds of bacteria can cause bacterial vaginosis. However, the reason that the condition develops is not fully understood. RISK FACTORS Certain activities or behaviors can put you at an increased risk of developing bacterial vaginosis, including:  Having a new sex partner or multiple sex partners.  Douching.  Using an intrauterine device (IUD) for contraception. Women do not get bacterial vaginosis from toilet seats, bedding, swimming pools, or contact with objects around them. SIGNS AND SYMPTOMS  Some women with bacterial vaginosis have no signs or symptoms. Common symptoms include:  Grey vaginal discharge.  A fishlike odor with discharge, especially after sexual intercourse.  Itching or burning of the vagina and vulva.  Burning or pain with urination. DIAGNOSIS  Your health care provider will take a medical history and examine the vagina for signs of bacterial vaginosis. A sample of vaginal fluid may be taken. Your health care provider will look at  this sample under a microscope to check for bacteria and abnormal cells. A vaginal pH test may also be done.  TREATMENT  Bacterial vaginosis may be treated with antibiotic medicines. These may be given in the form of a pill or a vaginal cream. A second round of antibiotics may be prescribed if the condition comes back after treatment.  HOME CARE INSTRUCTIONS   Only take over-the-counter or prescription medicines as directed by your health care provider.  If antibiotic medicine was prescribed, take it as directed. Make sure you finish it even if you start to feel better.  Do not have sex until treatment is completed.  Tell all sexual partners that you have a vaginal infection. They should see their health care provider and be treated if they have problems, such as a mild rash or itching.  Practice safe sex by using condoms and only having one sex partner. SEEK MEDICAL CARE IF:   Your symptoms are not improving after 3 days of treatment.  You have increased discharge or pain.  You have a fever. MAKE SURE YOU:   Understand these instructions.  Will watch your condition.  Will get help right away if you are not doing well or get worse. FOR MORE INFORMATION  Centers for Disease Control and Prevention, Division of STD Prevention: [employer]s.[employer].za American Sexual Health Association (ASHA): www.ashastd.org  Document  Released: 03/14/2005 Document Revised: 01/02/2013 Document Reviewed: 10/24/2012 [facility] Patient Information 2015 [place], [place]. This information is not intended to replace advice given to you by your health care provider. Make sure you discuss any questions you have with your health care provider. Trichomoniasis Trichomoniasis is an infection caused by an organism called Trichomonas. The infection can affect both women and men. In women, the outer female genitalia and the vagina are affected. In men, the penis is mainly affected, but the prostate and other reproductive organs  can also be involved. Trichomoniasis is a sexually transmitted infection (STI) and is most often passed to another person through sexual contact.  RISK FACTORS  Having unprotected sexual intercourse.  Having sexual intercourse with an infected partner. SIGNS AND SYMPTOMS  Symptoms of trichomoniasis in women include:  Abnormal gray-green frothy vaginal discharge.  Itching and irritation of the vagina.  Itching and irritation of the area outside the vagina. Symptoms of trichomoniasis in men include:   Penile discharge with or without pain.  Pain during urination. This results from inflammation of the urethra. DIAGNOSIS  Trichomoniasis may be found during a Pap test or physical exam. Your health care provider may use one of the following methods to help diagnose this infection:  Examining vaginal discharge under a microscope. For men, urethral discharge would be examined.  Testing the pH of the vagina with a test tape.  Using a vaginal swab test that checks for the Trichomonas organism. A test is available that provides results within a few minutes.  Doing a culture test for the organism. This is not usually needed. TREATMENT   You may be given medicine to fight the infection. Women should inform their health care provider if they could be or are pregnant. Some medicines used to treat the infection should not be taken during pregnancy.  Your health care provider may recommend over-the-counter medicines or creams to decrease itching or irritation.  Your sexual partner will need to be treated if infected. HOME CARE INSTRUCTIONS   Take medicines only as directed by your health care provider.  Take over-the-counter medicine for itching or irritation as directed by your health care provider.  Do not have sexual intercourse while you have the infection.  Women should not douche or wear tampons while they have the infection.  Discuss your infection with your partner. Your partner  may have gotten the infection from you, or you may have gotten it from your partner.  Have your sex partner get examined and treated if necessary.  Practice safe, informed, and protected sex.  See your health care provider for other STI testing. SEEK MEDICAL CARE IF:   You still have symptoms after you finish your medicine.  You develop abdominal pain.  You have pain when you urinate.  You have bleeding after sexual intercourse.  You develop a rash.  Your medicine makes you sick or makes you throw up (vomit). MAKE SURE YOU:  Understand these instructions.  Will watch your condition.  Will get help right away if you are not doing well or get worse. Document Released: 09/07/2000 Document Revised: 07/29/2013 Document Reviewed: 12/24/2012 [facility] Patient Information 2015 [place], [place]. This information is not intended to replace advice given to you by your health care provider. Make sure you discuss any questions you have with your health care provider.

## 2013-11-13 NOTE — ED Notes (Addendum)
Pt presents to department for evaluation of diffuse abdominal pain and nausea/vomiting. 9/10 pain upon arrival. Pt also states green/yellow vaginal discharge with foul odor. Pt is alert and oriented x4.

## 2013-11-14 LAB — GC/CHLAMYDIA PROBE AMP
CT Probe RNA: NEGATIVE
GC Probe RNA: NEGATIVE

## 2013-11-14 LAB — HIV ANTIBODY (ROUTINE TESTING W REFLEX): HIV 1&2 Ab, 4th Generation: NONREACTIVE

## 2013-11-14 NOTE — ED Provider Notes (Signed)
Medical screening examination/treatment/procedure(s) were performed by non-physician practitioner and as supervising physician I was immediately available for consultation/collaboration.   Meka Lewan T Early Steel, MD 11/14/13 2252 

## 2014-01-27 ENCOUNTER — Encounter (HOSPITAL_COMMUNITY): Payer: Self-pay | Admitting: Emergency Medicine

## 2015-02-23 ENCOUNTER — Inpatient Hospital Stay (HOSPITAL_COMMUNITY)
Admission: AD | Admit: 2015-02-23 | Discharge: 2015-02-23 | Disposition: A | Discharge status: Home / Self Care | Payer: Self-pay | Source: Ambulatory Visit | Attending: Family Medicine | Admitting: Family Medicine

## 2015-02-23 ENCOUNTER — Encounter (HOSPITAL_COMMUNITY): Payer: Self-pay | Admitting: *Deleted

## 2015-02-23 DIAGNOSIS — N390 Urinary tract infection, site not specified: Secondary | ICD-10-CM | POA: Insufficient documentation

## 2015-02-23 DIAGNOSIS — I10 Essential (primary) hypertension: Secondary | ICD-10-CM

## 2015-02-23 DIAGNOSIS — B3749 Other urogenital candidiasis: Secondary | ICD-10-CM

## 2015-02-23 LAB — URINALYSIS, ROUTINE W REFLEX MICROSCOPIC
Bilirubin Urine: NEGATIVE
Glucose, UA: NEGATIVE mg/dL
Ketones, ur: 15 mg/dL — AB
Nitrite: POSITIVE — AB
Protein, ur: NEGATIVE mg/dL
Specific Gravity, Urine: 1.015 (ref 1.005–1.030)
pH: 8.5 — ABNORMAL HIGH (ref 5.0–8.0)

## 2015-02-23 LAB — URINE MICROSCOPIC-ADD ON

## 2015-02-23 LAB — GLUCOSE, CAPILLARY: Glucose-Capillary: 60 mg/dL — ABNORMAL LOW (ref 65–99)

## 2015-02-23 LAB — POCT PREGNANCY, URINE: Preg Test, Ur: NEGATIVE

## 2015-02-23 MED ORDER — AMLODIPINE BESYLATE 5 MG PO TABS: 5.0000 mg | ORAL_TABLET | Freq: Every day | ORAL | Status: DC

## 2015-02-23 MED ORDER — SULFAMETHOXAZOLE-TRIMETHOPRIM 800-160 MG PO TABS
1.0000 | ORAL_TABLET | Freq: Once | ORAL | Status: AC
  Administered 2015-02-23: 1 via ORAL
  Filled 2015-02-23: qty 1

## 2015-02-23 MED ORDER — AMLODIPINE BESYLATE 10 MG PO TABS
10.0000 mg | ORAL_TABLET | Freq: Once | ORAL | Status: AC
  Administered 2015-02-23: 10 mg via ORAL
  Filled 2015-02-23: qty 1

## 2015-02-23 MED ORDER — AMLODIPINE BESYLATE 10 MG PO TABS: 10.0000 mg | ORAL_TABLET | Freq: Every day | ORAL | Status: DC

## 2015-02-23 MED ORDER — SULFAMETHOXAZOLE-TRIMETHOPRIM 800-160 MG PO TABS: 1.0000 | ORAL_TABLET | Freq: Two times a day (BID) | ORAL | Status: DC

## 2015-02-23 NOTE — MAU Note (Signed)
Pt complains of burning on the back of the upper left leg.  Whenever the pt begins to feel the burning she has to go urinate.  Pt states she has been diagnosed with a UTI about 3 weeks ago.  Pt states she was treated with antibiotics.

## 2015-02-23 NOTE — MAU Provider Note (Signed)
  History   G4P4 not pregnant in with c/o burning pain down back of upper left leg. But states it only happens when she needs to empty her bladder. States had UTI 3 wks ago and that's when her symptoms started. Also had GHTN while she was pregnant.  CSN: [id number]  Arrival date and time: 02/23/15 6578   None     Chief Complaint  Patient presents with  . Leg Pain   HPI  OB History    Gravida Para Term Preterm AB TAB SAB Ectopic Multiple Living   0 0 0 0 0 0 4      Past Medical History  Diagnosis Date  . No pertinent past medical history   . Medical history non-contributory     Past Surgical History  Procedure Laterality Date  . Multiple tooth extractions    . No past surgeries      History reviewed. No pertinent family history.  Social History  Substance Use Topics  . Smoking status: Never Smoker   . Smokeless tobacco: Never Used  . Alcohol Use: No    Allergies: No Known Allergies  Prescriptions prior to admission  Medication Sig Dispense Refill Last Dose  . metroNIDAZOLE (FLAGYL) 500 MG tablet Take 1 tablet (500 mg total) by mouth 2 (two) times daily. 14 tablet 0   . ondansetron (ZOFRAN ODT) 4 MG disintegrating tablet Take 1 tablet (4 mg total) by mouth every 8 (eight) hours as needed for nausea or vomiting. 20 tablet 0     Review of Systems  Constitutional: Negative.   HENT: Negative.   Eyes: Negative.   Respiratory: Negative.   Cardiovascular: Negative.   Gastrointestinal: Negative.   Genitourinary: Negative.   Musculoskeletal:       Pain in left upper leg that radiates from buttocks down when she has to urinate.  Skin: Negative.   Neurological: Negative.   Endo/Heme/Allergies: Negative.   Psychiatric/Behavioral: Negative.    Physical Exam   Blood pressure 176/94, pulse 70, temperature 98.6 F (37 C), temperature source Oral, resp. rate 18.  Physical Exam  Constitutional: She is oriented to person, place, and time. She appears  well-developed and well-nourished.  HENT:  Head: Normocephalic.  Eyes: Pupils are equal, round, and reactive to light.  Neck: Normal range of motion.  Cardiovascular: Normal rate, regular rhythm, normal heart sounds and intact distal pulses.   Respiratory: Effort normal and breath sounds normal.  GI: Soft. Bowel sounds are normal.  Genitourinary: Vagina normal and uterus normal.  Musculoskeletal: Normal range of motion.  Neurological: She is alert and oriented to person, place, and time. She has normal reflexes.  Skin: Skin is warm and dry.  Psychiatric: She has a normal mood and affect. Her behavior is normal. Judgment and thought content normal.    MAU Course  Procedures  MDM Hypretyension UTI  Assessment and Plan  Will start pt on antihypertensive. Discussed reprecusions of untreated hypertension in females. Pt verbalized understanding and also will give pt list of primary care for follow up. Urine culture  Alicia Rojas DARLENE 02/23/2015, 4:45 PM

## 2015-02-23 NOTE — Discharge Instructions (Signed)
Hypertension Hypertension, commonly called high blood pressure, is when the force of blood pumping through your arteries is too strong. Your arteries are the blood vessels that carry blood from your heart throughout your body. A blood pressure reading consists of a higher number over a lower number, such as 110/72. The higher number (systolic) is the pressure inside your arteries when your heart pumps. The lower number (diastolic) is the pressure inside your arteries when your heart relaxes. Ideally you want your blood pressure below 120/80. Hypertension forces your heart to work harder to pump blood. Your arteries may become narrow or stiff. Having untreated or uncontrolled hypertension can cause heart attack, stroke, kidney disease, and other problems. RISK FACTORS Some risk factors for high blood pressure are controllable. Others are not.  Risk factors you cannot control include:   Race. You may be at higher risk if you are African American.  Age. Risk increases with age.  Gender. Men are at higher risk than women before age 45 years. After age 65, women are at higher risk than men. Risk factors you can control include:  Not getting enough exercise or physical activity.  Being overweight.  Getting too much fat, sugar, calories, or salt in your diet.  Drinking too much alcohol. SIGNS AND SYMPTOMS Hypertension does not usually cause signs or symptoms. Extremely high blood pressure (hypertensive crisis) may cause headache, anxiety, shortness of breath, and nosebleed. DIAGNOSIS To check if you have hypertension, your health care provider will measure your blood pressure while you are seated, with your arm held at the level of your heart. It should be measured at least twice using the same arm. Certain conditions can cause a difference in blood pressure between your right and left arms. A blood pressure reading that is higher than normal on one occasion does not mean that you need treatment. If  it is not clear whether you have high blood pressure, you may be asked to return on a different day to have your blood pressure checked again. Or, you may be asked to monitor your blood pressure at home for 1 or more weeks. TREATMENT Treating high blood pressure includes making lifestyle changes and possibly taking medicine. Living a healthy lifestyle can help lower high blood pressure. You may need to change some of your habits. Lifestyle changes may include:  Following the DASH diet. This diet is high in fruits, vegetables, and whole grains. It is low in salt, red meat, and added sugars.  Keep your sodium intake below 2,300 mg per day.  Getting at least 30-45 minutes of aerobic exercise at least 4 times per week.  Losing weight if necessary.  Not smoking.  Limiting alcoholic beverages.  Learning ways to reduce stress. Your health care provider may prescribe medicine if lifestyle changes are not enough to get your blood pressure under control, and if one of the following is true:  You are 18-59 years of age and your systolic blood pressure is above 140.  You are 60 years of age or older, and your systolic blood pressure is above 150.  Your diastolic blood pressure is above 90.  You have diabetes, and your systolic blood pressure is over 140 or your diastolic blood pressure is over 90.  You have kidney disease and your blood pressure is above 140/90.  You have heart disease and your blood pressure is above 140/90. Your personal target blood pressure may vary depending on your medical conditions, your age, and other factors. HOME CARE INSTRUCTIONS    Have your blood pressure rechecked as directed by your health care provider.   Take medicines only as directed by your health care provider. Follow the directions carefully. Blood pressure medicines must be taken as prescribed. The medicine does not work as well when you skip doses. Skipping doses also puts you at risk for  problems.  Do not smoke.   Monitor your blood pressure at home as directed by your health care provider. SEEK MEDICAL CARE IF:   You think you are having a reaction to medicines taken.  You have recurrent headaches or feel dizzy.  You have swelling in your ankles.  You have trouble with your vision. SEEK IMMEDIATE MEDICAL CARE IF:  You develop a severe headache or confusion.  You have unusual weakness, numbness, or feel faint.  You have severe chest or abdominal pain.  You vomit repeatedly.  You have trouble breathing. MAKE SURE YOU:   Understand these instructions.  Will watch your condition.  Will get help right away if you are not doing well or get worse.   This information is not intended to replace advice given to you by your health care provider. Make sure you discuss any questions you have with your health care provider.   Document Released: 03/14/2005 Document Revised: 07/29/2014 Document Reviewed: 01/04/2013 Elsevier Interactive Patient Education 2016 [employer]. Asymptomatic Bacteriuria, Female Asymptomatic bacteriuria is the presence of a large number of bacteria in your urine without the usual symptoms of burning or frequent urination. The following conditions increase the risk of asymptomatic bacteriuria:  Diabetes mellitus.  Advanced age.  Pregnancy in the first trimester.  Kidney stones.  Kidney transplants.  Leaky kidney tube valve in young children (reflux). Treatment for this condition is not needed in most people and can lead to other problems such as too much yeast and growth of resistant bacteria. However, some people, such as pregnant women, do need treatment to prevent kidney infection. Asymptomatic bacteriuria in pregnancy is also associated with fetal growth restriction, premature labor, and newborn death. HOME CARE INSTRUCTIONS Monitor your condition for any changes. The following actions may help to relieve any discomfort you are  feeling:  Drink enough water and fluids to keep your urine clear or pale yellow. Go to the bathroom more often to keep your bladder empty.  Keep the area around your vagina and rectum clean. Wipe yourself from front to back after urinating. SEEK IMMEDIATE MEDICAL CARE IF:  You develop signs of an infection such as:  Burning with urination.  Frequency of voiding.  Back pain.  Fever.  You have blood in the urine.  You develop a fever. MAKE SURE YOU:  Understand these instructions.  Will watch your condition.  Will get help right away if you are not doing well or get worse.   This information is not intended to replace advice given to you by your health care provider. Make sure you discuss any questions you have with your health care provider.   Document Released: 03/14/2005 Document Revised: 04/04/2014 Document Reviewed: 09/03/2012 Elsevier Interactive Patient Education [employer]

## 2015-10-14 ENCOUNTER — Inpatient Hospital Stay (HOSPITAL_COMMUNITY)
Admission: AD | Admit: 2015-10-14 | Discharge: 2015-10-14 | Disposition: A | Discharge status: Home / Self Care | Payer: Medicaid Other | Source: Ambulatory Visit | Attending: Obstetrics & Gynecology | Admitting: Obstetrics & Gynecology

## 2015-10-14 ENCOUNTER — Encounter (HOSPITAL_COMMUNITY): Payer: Self-pay | Admitting: *Deleted

## 2015-10-14 DIAGNOSIS — B9689 Other specified bacterial agents as the cause of diseases classified elsewhere: Secondary | ICD-10-CM | POA: Insufficient documentation

## 2015-10-14 DIAGNOSIS — R1030 Lower abdominal pain, unspecified: Secondary | ICD-10-CM | POA: Insufficient documentation

## 2015-10-14 DIAGNOSIS — I10 Essential (primary) hypertension: Secondary | ICD-10-CM | POA: Insufficient documentation

## 2015-10-14 DIAGNOSIS — N930 Postcoital and contact bleeding: Secondary | ICD-10-CM | POA: Insufficient documentation

## 2015-10-14 DIAGNOSIS — A5901 Trichomonal vulvovaginitis: Secondary | ICD-10-CM | POA: Insufficient documentation

## 2015-10-14 DIAGNOSIS — N760 Acute vaginitis: Secondary | ICD-10-CM | POA: Insufficient documentation

## 2015-10-14 HISTORY — DX: Chlamydial infection, unspecified: A74.9

## 2015-10-14 LAB — URINALYSIS, ROUTINE W REFLEX MICROSCOPIC
Bilirubin Urine: NEGATIVE
GLUCOSE, UA: NEGATIVE mg/dL
Ketones, ur: NEGATIVE mg/dL
Nitrite: NEGATIVE
PH: 6.5 (ref 5.0–8.0)
PROTEIN: NEGATIVE mg/dL
Specific Gravity, Urine: 1.01 (ref 1.005–1.030)

## 2015-10-14 LAB — WET PREP, GENITAL
Sperm: NONE SEEN
Yeast Wet Prep HPF POC: NONE SEEN

## 2015-10-14 LAB — POCT PREGNANCY, URINE: Preg Test, Ur: NEGATIVE

## 2015-10-14 LAB — URINE MICROSCOPIC-ADD ON

## 2015-10-14 MED ORDER — ONDANSETRON 8 MG PO TBDP
8.0000 mg | ORAL_TABLET | Freq: Once | ORAL | Status: AC
  Administered 2015-10-14: 8 mg via ORAL
  Filled 2015-10-14: qty 1

## 2015-10-14 MED ORDER — CEFTRIAXONE SODIUM 250 MG IJ SOLR
250.0000 mg | Freq: Once | INTRAMUSCULAR | Status: AC
  Administered 2015-10-14: 250 mg via INTRAMUSCULAR
  Filled 2015-10-14: qty 250

## 2015-10-14 MED ORDER — AMLODIPINE BESYLATE 10 MG PO TABS: 10.0000 mg | ORAL_TABLET | Freq: Every day | ORAL | Status: DC

## 2015-10-14 MED ORDER — AZITHROMYCIN 250 MG PO TABS
1000.0000 mg | ORAL_TABLET | Freq: Once | ORAL | Status: AC
  Administered 2015-10-14: 1000 mg via ORAL
  Filled 2015-10-14: qty 4

## 2015-10-14 MED ORDER — METRONIDAZOLE 500 MG PO TABS: 500.0000 mg | ORAL_TABLET | Freq: Two times a day (BID) | ORAL | Status: DC

## 2015-10-14 NOTE — MAU Note (Signed)
PT presents to MAU with complaints of vaginal discharge and bleeding with intercourse

## 2015-10-14 NOTE — MAU Note (Signed)
Pt C/O discharge for the past 3 days, yellow/brown with odor.  Also bleeding during intercourse.  Lower abd pain started today.

## 2015-10-14 NOTE — MAU Provider Note (Signed)
History     CSN: [id number]  Arrival date and time: 10/14/15 1807   None     Chief Complaint  Patient presents with  . Vaginal Bleeding  . Vaginal Discharge   HPI Pt is 30 y.o.not pregnant [id number] (with Paraguard put in 4 years ago at [facility])) who presents with yellow vaginal discharge; with fishy odor for 3 days; she also had bleeding with IC yesterday, but has subsided.  Pt has regular menses with her IUD. Pt was seen at [facility] 8 mos ago. Pt has hx of chlamydia 2012 with same partner- partner was treated- pt does not suspect outside partner. Pt denies pain with intercourse, UTI sx, nausea, vomiting, denies constipation or diarrhea. Pt has mild cramps with menses. 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 Also bleeding during intercourse. Lower abd pain started today.     Editor: Enid Baas, RN Social research officer, government)     Expand All Collapse All   PT presents to MAU with complaints of vaginal discharge and bleeding with intercourse        Past Medical History  Diagnosis Date  . No pertinent past medical history   . Medical history non-contributory     Past Surgical History  Procedure Laterality Date  . Multiple tooth extractions    . No past surgeries      No family history on file.  Social History  Substance Use Topics  . Smoking status: Never Smoker   . Smokeless tobacco: Never Used  . Alcohol Use: No    Allergies: No Known Allergies  Prescriptions prior to admission  Medication Sig Dispense Refill Last Dose  . amLODipine (NORVASC) 10 MG tablet Take 1 tablet (10 mg total) by mouth daily. 30 tablet 12   . sulfamethoxazole-trimethoprim (BACTRIM DS,SEPTRA DS) 800-160 MG tablet Take 1 tablet by mouth 2 (two) times  daily. 20 tablet 0     Review of Systems  Constitutional: Negative for fever and chills.  Gastrointestinal: Negative for nausea, vomiting, abdominal pain, diarrhea and constipation.  Genitourinary: Negative for dysuria.   Physical Exam   There were no vitals taken for this visit.  Physical Exam  Vitals reviewed. Constitutional: She is oriented to person, place, and time. She appears well-developed and well-nourished. No distress.  HENT:  Head: Normocephalic.  Eyes: Pupils are equal, round, and reactive to light.  Neck: Normal range of motion.  Respiratory: Effort normal.  GI: Soft. She exhibits no distension. There is tenderness. There is no rebound and no guarding.  Mild bilateral suprapubic tenderness  Genitourinary:  Small amount of yellow thin discharge in vault; cervix clean, small amount of blood at os; uterus retroverted NSSC NT; adnexa without palpable enlargement or tenderness  Musculoskeletal: Normal range of motion.  Neurological: She is alert and oriented to person, place, and time.  Skin: Skin is warm and dry.  Psychiatric: She has a normal mood and affect.    MAU Course  Procedures Results for orders placed or performed during the hospital encounter of 10/14/15 (from the past 24 hour(s))  Wet prep, genital     Status: Abnormal   Collection Time: 10/14/15  7:00 PM  Result Value Ref Range   Yeast Wet Prep HPF POC NONE  SEEN NONE SEEN   Trich, Wet Prep PRESENT (A) NONE SEEN   Clue Cells Wet Prep HPF POC PRESENT (A) NONE SEEN   WBC, Wet Prep HPF POC MODERATE (A) NONE SEEN   Sperm NONE SEEN   Urinalysis, Routine w reflex microscopic (not at [facility])     Status: Abnormal   Collection Time: 10/14/15  7:55 PM  Result Value Ref Range   Color, Urine YELLOW YELLOW   APPearance CLEAR CLEAR   Specific Gravity, Urine 1.010 1.005 - 1.030   pH 6.5 5.0 - 8.0   Glucose, UA NEGATIVE NEGATIVE mg/dL   Hgb urine dipstick MODERATE (A) NEGATIVE   Bilirubin Urine NEGATIVE NEGATIVE    Ketones, ur NEGATIVE NEGATIVE mg/dL   Protein, ur NEGATIVE NEGATIVE mg/dL   Nitrite NEGATIVE NEGATIVE   Leukocytes, UA SMALL (A) NEGATIVE  Urine microscopic-add on     Status: Abnormal   Collection Time: 10/14/15  7:55 PM  Result Value Ref Range   Squamous Epithelial / LPF 0-5 (A) NONE SEEN   WBC, UA 0-5 0 - 5 WBC/hpf   RBC / HPF 0-5 0 - 5 RBC/hpf   Bacteria, UA FEW (A) NONE SEEN   Urine-Other TRICHOMONAS PRESENT   Pregnancy, urine POC     Status: None   Collection Time: 10/14/15  8:14 PM  Result Value Ref Range   Preg Test, Ur NEGATIVE NEGATIVE  GC/chlamydia pending Rocephin  IM and Zithromax 1 gm PO given for prophylaxis Discussed hypertension- pt to restart prescription and f/u with primary health care provider Partner to have STD testing- and to be treated for Trich Counseling for patient and husband recommended at [facility] Rx Flagyl  BID for 7 days for BV and Trich Pt threw up after crackers and zithromax- pt did not see pills- Zofran 8 mg ODT given Pt advised may need retreatment with Zithromax if +chlamydia  Assessment and Plan  Postcoital bleeding- rocephin  IM and zithromax 1 gm PO given in MAU prophylactically- (pt threw up after given Zithromax and crackers- pt did not see pills- recommend retreat if positive chlamydia)- zofran 8 mg ODT given in MAU Trich and BV- Rx for Flagyl  BID for 7 days Partner to go to STD clinic and get tested and treated for Trich Continue Paraguard IUD Counseling for pt and husband Hypertension- Rx for Norvasc 10- importance of taking medication and following up with primary care provider- recommended [facility]  [facility] 10/14/2015, 6:38 PM

## 2015-10-15 LAB — GC/CHLAMYDIA PROBE AMP (~~LOC~~) NOT AT ARMC
Chlamydia: NEGATIVE
NEISSERIA GONORRHEA: NEGATIVE

## 2015-12-28 ENCOUNTER — Encounter (HOSPITAL_COMMUNITY): Payer: Self-pay | Admitting: *Deleted

## 2015-12-28 ENCOUNTER — Emergency Department (HOSPITAL_COMMUNITY)
Admission: EM | Admit: 2015-12-28 | Discharge: 2015-12-28 | Disposition: A | Discharge status: Home / Self Care | Payer: Medicaid Other | Attending: Emergency Medicine | Admitting: Emergency Medicine

## 2015-12-28 DIAGNOSIS — I10 Essential (primary) hypertension: Secondary | ICD-10-CM | POA: Insufficient documentation

## 2015-12-28 DIAGNOSIS — M6281 Muscle weakness (generalized): Secondary | ICD-10-CM | POA: Insufficient documentation

## 2015-12-28 DIAGNOSIS — R29898 Other symptoms and signs involving the musculoskeletal system: Secondary | ICD-10-CM

## 2015-12-28 DIAGNOSIS — F17200 Nicotine dependence, unspecified, uncomplicated: Secondary | ICD-10-CM | POA: Insufficient documentation

## 2015-12-28 MED ORDER — NAPROXEN 500 MG PO TABS: 500.0000 mg | ORAL_TABLET | Freq: Two times a day (BID) | ORAL | 0 refills | Status: DC

## 2015-12-28 NOTE — ED Triage Notes (Signed)
Pt c/o decreased movement in R hand. Pt reports not being able to open and close hand; although, pt able to grip hand bag and cellphone without difficulty. Pt denies pain, numbness, or injury. Does work as a Lawyer

## 2015-12-28 NOTE — Discharge Instructions (Signed)
Please read and follow all provided instructions.  Your diagnoses today include:  1. Weakness of right hand     Tests performed today include:  Vital signs. See below for your results today.   Medications prescribed:   Naproxen - anti-inflammatory pain medication  Do not exceed 500mg  naproxen every 12 hours, take with food  You have been prescribed an anti-inflammatory medication or NSAID. Take with food. Take smallest effective dose for the shortest duration needed for your pain. Stop taking if you experience stomach pain or vomiting.   Take any prescribed medications only as directed.  Home care instructions:   Follow any educational materials contained in this packet  Follow R.I.C.E. Protocol:  R - rest your injury   I  - use ice on injury without applying directly to skin  C - compress injury with bandage or splint  E - elevate the injury as much as possible  Follow-up instructions: Please follow-up with the hand specialist listed in the next 1 week.   Return instructions:   Please return if your fingers are numb or tingling, appear gray or blue, or you have severe pain (also elevate the arm and loosen splint or wrap if you were given one)  Please return to the Emergency Department if you experience worsening symptoms.   Please return if you have any other emergent concerns.  Additional Information:  Your vital signs today were: BP 156/95 (BP Location: Right Arm)    Pulse 74    Temp 98.2 F (36.8 C) (Oral)    Resp 16    LMP 12/16/2015    SpO2 100%  If your blood pressure (BP) was elevated above 135/85 this visit, please have this repeated by your doctor within one month. --------------

## 2015-12-28 NOTE — Progress Notes (Signed)
Orthopedic Tech Progress Note Patient Details:  Alicia Rojas November 13, 1985 [id number]  Ortho Devices Type of Ortho Device: Velcro wrist splint Ortho Device/Splint Location: rue Ortho Device/Splint Interventions: Ordered, Application   Trinna Post 12/28/2015, 7:48 PM

## 2015-12-28 NOTE — ED Provider Notes (Signed)
[facility]-EMERGENCY DEPT Provider Note   CSN: [id number] Arrival date & time: 12/28/15  1812  By signing my name below, I, [name], attest that this documentation has been prepared under the direction and in the presence of  Josh Bay Jarquin PA-C. Electronically Signed: [name], ED Scribe. 12/28/15. 7:33 PM.  History   Chief Complaint Chief Complaint  Patient presents with  . Hand Problem   HPI   HPI Comments:  Alicia Abuiffany N Rojas is a 30 y.o. female who presents to the Emergency Department complaining of weakness in her right hand onset 2 weeks ago.  Pt states she is unable to fully open her hand and extend her fingers.  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  She denies injury to her right hand and any pain at the site.  No numbness, tingling. No treatments PTA. She continues to work.   Past Medical History:  Diagnosis Date  . Chlamydia   . Hypertension   . No pertinent past medical history     Patient Active Problem List   Diagnosis Date Noted  . Normal delivery 01/05/2011  . Threatened preterm labor 12/22/2010    Past Surgical History:  Procedure Laterality Date  . MULTIPLE TOOTH EXTRACTIONS      OB History    Gravida Para Term Preterm AB Living   4 4 4  0 0 4   SAB TAB Ectopic Multiple Live Births   0 0 0 0 1       Home Medications    Prior to Admission medications   Medication Sig Start Date End Date Taking? Authorizing Provider  amLODipine (NORVASC) 10 MG tablet Take 1 tablet (10 mg total) by mouth daily. 02/23/15   Montez MoritaMarie D Lawson, CNM  amLODipine (NORVASC) 10 MG tablet Take 1 tablet (10 mg total) by mouth daily. 10/14/15   Jean RosenthalSusan P Lineberry, NP  metroNIDAZOLE (FLAGYL) 500 MG tablet Take 1 tablet (500 mg total) by mouth 2 (two) times daily. 10/14/15   Jean RosenthalSusan P Lineberry, NP    Family History No family history on file.  Social History Social History  Substance Use Topics  . Smoking status: Current Some Day Smoker  . Smokeless  tobacco: Never Used  . Alcohol use Yes     Comment: occasional     Allergies   Review of patient's allergies indicates no known allergies.   Review of Systems Review of Systems  Constitutional: Negative for activity change.  Musculoskeletal: Negative for arthralgias, back pain, joint swelling and neck pain.  Skin: Negative for wound.  Neurological: Positive for weakness. Negative for numbness.     Physical Exam Updated Vital Signs BP 156/95 (BP Location: Right Arm)   Pulse 74   Temp 98.2 F (36.8 C) (Oral)   Resp 16   LMP 12/16/2015   SpO2 100%   Physical Exam  Constitutional: She appears well-developed and well-nourished.  HENT:  Head: Normocephalic and atraumatic.  Eyes: Pupils are equal, round, and reactive to light.  Neck: Normal range of motion. Neck supple.  Cardiovascular: Normal pulses.  Exam reveals no decreased pulses.   Musculoskeletal: She exhibits no edema or tenderness.       Right shoulder: Normal.       Right elbow: Normal.      Right wrist: Normal. She exhibits normal range of motion, no tenderness and no bony tenderness.       Right upper arm: Normal.       Right forearm: Normal.  Right hand: She exhibits decreased range of motion and decreased capillary refill. She exhibits no tenderness, no deformity and no swelling. Normal sensation noted. Decreased strength noted. She exhibits finger abduction. She exhibits no wrist extension trouble.  Patient has difficulty extending her fingers and abducting in abducting her fingers. She has full range of motion in her wrist. She has normal sensation in all digits in all distributions. Normal capillary refill.  Neurological: She is alert. She displays no atrophy. No sensory deficit. She exhibits normal muscle tone.  Motor, sensation, and vascular distal to the injury is fully intact.   Skin: Skin is warm and dry.  Psychiatric: She has a normal mood and affect.  Nursing note and vitals reviewed.    ED  Treatments / Results   DIAGNOSTIC STUDIES:  Oxygen Saturation is 100% on RA, NML by my interpretation.    COORDINATION OF CARE:  7:35 PM Will give splint.  Pt advised to take Naproxen BID.  Encouraged to follow-up with orthopedic hand physician. Discussed treatment plan with pt at bedside and pt agreed to plan.  Procedures Procedures (including critical care time)  Initial Impression / Assessment and Plan / ED Course  I have reviewed the triage vital signs and the nursing notes.  Pertinent labs & imaging results that were available during my care of the patient were reviewed by me and considered in my medical decision making (see chart for details).  Clinical Course   Encouraged patient to return to emergency department with worsening symptoms or other concerns.  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 She has weakness with extension of her digits as well as in abduction and abduction of her rigors. She does not appear to have a radial nerve palsy. Normal range of motion of her wrist. Weakness involves multiple nerve distributions. No orthopedic emergency suspected. No other focal neurological deficits to suggest central cause. Will treat conservatively and have her follow up with hand surgeon.   New Prescriptions New Prescriptions   NAPROXEN (NAPROSYN) 500 MG TABLET    Take 1 tablet (500 mg total) by mouth 2 (two) times daily.     Renne Crigler, PA-C 12/28/15 1951    Margarita Grizzle, MD 12/31/15 [phone]

## 2016-11-05 ENCOUNTER — Inpatient Hospital Stay (HOSPITAL_COMMUNITY)
Admission: AD | Admit: 2016-11-05 | Discharge: 2016-11-05 | Disposition: A | Discharge status: Home / Self Care | Payer: Medicaid Other | Source: Ambulatory Visit | Attending: Family Medicine | Admitting: Family Medicine

## 2016-11-05 ENCOUNTER — Encounter (HOSPITAL_COMMUNITY): Payer: Self-pay

## 2016-11-05 DIAGNOSIS — N898 Other specified noninflammatory disorders of vagina: Secondary | ICD-10-CM

## 2016-11-05 DIAGNOSIS — N760 Acute vaginitis: Secondary | ICD-10-CM

## 2016-11-05 DIAGNOSIS — I10 Essential (primary) hypertension: Secondary | ICD-10-CM

## 2016-11-05 DIAGNOSIS — F17200 Nicotine dependence, unspecified, uncomplicated: Secondary | ICD-10-CM | POA: Insufficient documentation

## 2016-11-05 DIAGNOSIS — B9689 Other specified bacterial agents as the cause of diseases classified elsewhere: Secondary | ICD-10-CM

## 2016-11-05 HISTORY — DX: Trichomoniasis, unspecified: A59.9

## 2016-11-05 HISTORY — DX: Gestational (pregnancy-induced) hypertension without significant proteinuria, unspecified trimester: O13.9

## 2016-11-05 LAB — URINALYSIS, ROUTINE W REFLEX MICROSCOPIC
Bilirubin Urine: NEGATIVE
GLUCOSE, UA: NEGATIVE mg/dL
Hgb urine dipstick: NEGATIVE
Ketones, ur: NEGATIVE mg/dL
Nitrite: NEGATIVE
PROTEIN: NEGATIVE mg/dL
Specific Gravity, Urine: 1.016 (ref 1.005–1.030)
pH: 6 (ref 5.0–8.0)

## 2016-11-05 LAB — WET PREP, GENITAL
Sperm: NONE SEEN
TRICH WET PREP: NONE SEEN
YEAST WET PREP: NONE SEEN

## 2016-11-05 LAB — POCT PREGNANCY, URINE: Preg Test, Ur: NEGATIVE

## 2016-11-05 MED ORDER — METRONIDAZOLE 500 MG PO TABS: 500.0000 mg | ORAL_TABLET | Freq: Two times a day (BID) | ORAL | 0 refills | Status: DC

## 2016-11-05 NOTE — MAU Note (Signed)
Vaginal discharge for over a 1 week with an odor

## 2016-11-05 NOTE — Discharge Instructions (Signed)

## 2016-11-05 NOTE — MAU Provider Note (Signed)
History   [id number] not pregnant in with c/o foul smelling greenish discharge for several weeks now. States was sent letter by provider and was told she had trich.    CSN: [id number]  Arrival date & time 11/05/16  1050   None     Chief Complaint  Patient presents with  . Vaginal Discharge  . Vaginal Itching    HPI  Past Medical History:  Diagnosis Date  . Chlamydia   . Gestational hypertension   . No pertinent past medical history   . Trichimoniasis     Past Surgical History:  Procedure Laterality Date  . MULTIPLE TOOTH EXTRACTIONS      No family history on file.  Social History  Substance Use Topics  . Smoking status: Current Some Day Smoker  . Smokeless tobacco: Never Used  . Alcohol use Yes     Comment: occasional    OB History    Gravida Para Term Preterm AB Living   4 4 4  0 0 4   SAB TAB Ectopic Multiple Live Births   0 0 0 0 4      Review of Systems  Constitutional: Negative.   HENT: Negative.   Eyes: Negative.   Respiratory: Negative.   Cardiovascular: Negative.   Gastrointestinal: Negative.   Endocrine: Negative.   Genitourinary: Positive for vaginal discharge.  Musculoskeletal: Negative.   Skin: Negative.   Allergic/Immunologic: Negative.   Neurological: Negative.   Hematological: Negative.   Psychiatric/Behavioral: Negative.     Allergies  Patient has no known allergies.  Home Medications    Temp 98.2 F (36.8 C) (Oral)   Resp 18   Ht 5\' 1"  (1.549 m)   Wt 144 lb (65.3 kg)   LMP 10/03/2016   BMI 27.21 kg/m   Physical Exam  Constitutional: She is oriented to person, place, and time. She appears well-developed and well-nourished.  HENT:  Head: Normocephalic.  Neck: Normal range of motion.  Cardiovascular: Normal rate, regular rhythm, normal heart sounds and intact distal pulses.   Pulmonary/Chest: Effort normal and breath sounds normal.  Abdominal: Soft. Bowel sounds are normal.  Genitourinary:  Genitourinary Comments:  Frothy green vaginal discharge. Ammine odor noted.  Musculoskeletal: Normal range of motion.  Neurological: She is alert and oriented to person, place, and time. She has normal reflexes.  Skin: Skin is warm and dry.  Psychiatric: She has a normal mood and affect. Her behavior is normal. Judgment and thought content normal.    MAU Course  Procedures (including critical care time)  Labs Reviewed  WET PREP, GENITAL  URINALYSIS, ROUTINE W REFLEX MICROSCOPIC  RPR  HIV ANTIBODY (ROUTINE TESTING)  GC/CHLAMYDIA PROBE AMP ([place]) NOT AT [facility]   No results found.   1. Vaginal discharge    2. Chronic HTN   MDM   Exam shows frothy green vag discharge. BP is sl elevated. Pt states had GHTN but has not seen primary care for follow up. Cultures and wet prep obtained and shows pos clue. Will treat with flagyl and d/c home. To see primary care for hypertension.

## 2016-11-06 LAB — RPR: RPR: NONREACTIVE

## 2016-11-06 LAB — HIV ANTIBODY (ROUTINE TESTING W REFLEX): HIV SCREEN 4TH GENERATION: NONREACTIVE

## 2016-11-07 LAB — GC/CHLAMYDIA PROBE AMP (~~LOC~~) NOT AT ARMC
CHLAMYDIA, DNA PROBE: NEGATIVE
Neisseria Gonorrhea: NEGATIVE

## 2017-12-21 ENCOUNTER — Encounter (HOSPITAL_BASED_OUTPATIENT_CLINIC_OR_DEPARTMENT_OTHER): Payer: Self-pay | Admitting: Emergency Medicine

## 2017-12-21 ENCOUNTER — Other Ambulatory Visit: Payer: Self-pay

## 2017-12-21 ENCOUNTER — Emergency Department (HOSPITAL_BASED_OUTPATIENT_CLINIC_OR_DEPARTMENT_OTHER)
Admission: EM | Admit: 2017-12-21 | Discharge: 2017-12-21 | Disposition: A | Discharge status: Home / Self Care | Payer: Self-pay | Attending: Emergency Medicine | Admitting: Emergency Medicine

## 2017-12-21 DIAGNOSIS — F17210 Nicotine dependence, cigarettes, uncomplicated: Secondary | ICD-10-CM | POA: Insufficient documentation

## 2017-12-21 DIAGNOSIS — N760 Acute vaginitis: Secondary | ICD-10-CM | POA: Insufficient documentation

## 2017-12-21 DIAGNOSIS — B9689 Other specified bacterial agents as the cause of diseases classified elsewhere: Secondary | ICD-10-CM | POA: Insufficient documentation

## 2017-12-21 LAB — URINALYSIS, MICROSCOPIC (REFLEX)

## 2017-12-21 LAB — WET PREP, GENITAL
Sperm: NONE SEEN
TRICH WET PREP: NONE SEEN
YEAST WET PREP: NONE SEEN

## 2017-12-21 LAB — URINALYSIS, ROUTINE W REFLEX MICROSCOPIC
Bilirubin Urine: NEGATIVE
GLUCOSE, UA: NEGATIVE mg/dL
Ketones, ur: 15 mg/dL — AB
Nitrite: NEGATIVE
PH: 6 (ref 5.0–8.0)
Protein, ur: NEGATIVE mg/dL
SPECIFIC GRAVITY, URINE: 1.02 (ref 1.005–1.030)

## 2017-12-21 LAB — PREGNANCY, URINE: Preg Test, Ur: NEGATIVE

## 2017-12-21 MED ORDER — METRONIDAZOLE 500 MG PO TABS: 500.0000 mg | ORAL_TABLET | Freq: Two times a day (BID) | ORAL | 0 refills | Status: DC

## 2017-12-21 NOTE — Discharge Instructions (Signed)
Return if any problems.

## 2017-12-21 NOTE — ED Triage Notes (Signed)
Pt is c/o lower abd pain with some white vaginal discharge  Sxs started 3 days ago  Pt has hx of BV

## 2017-12-22 NOTE — ED Provider Notes (Signed)
[facility] EMERGENCY DEPARTMENT Provider Note   CSN: [id number] Arrival date & time: 12/21/17  1957     History   Chief Complaint Chief Complaint  Patient presents with  . Abdominal Pain  . Vaginal Discharge    HPI Alicia Rojas is a 32 y.o. female.  The history is provided by the patient. No language interpreter was used.  Vaginal Discharge   This is a new problem. The current episode started 2 days ago. The problem occurs constantly. The problem has been gradually worsening. The discharge was white. Pertinent negatives include no abdominal pain. She has tried nothing for the symptoms. The treatment provided no relief. Her past medical history does not include STD.  Pt has a histroy of BV.  Pt thinks she has again  Past Medical History:  Diagnosis Date  . Chlamydia   . Gestational hypertension   . No pertinent past medical history   . Trichimoniasis     Patient Active Problem List   Diagnosis Date Noted  . Normal delivery 01/05/2011  . Threatened preterm labor 12/22/2010    Past Surgical History:  Procedure Laterality Date  . MULTIPLE TOOTH EXTRACTIONS       OB History    Gravida  4   Para  4   Term  4   Preterm  0   AB  0   Living  4     SAB  0   TAB  0   Ectopic  0   Multiple  0   Live Births  4            Home Medications    Prior to Admission medications   Medication Sig Start Date End Date Taking? Authorizing Provider  amLODipine (NORVASC) 10 MG tablet Take 1 tablet (10 mg total) by mouth daily. 02/23/15   Montez Morita, CNM  amLODipine (NORVASC) 10 MG tablet Take 1 tablet (10 mg total) by mouth daily. 10/14/15   Jean Rosenthal, NP  metroNIDAZOLE (FLAGYL) 500 MG tablet Take 1 tablet (500 mg total) by mouth 2 (two) times daily. 12/21/17   Elson Areas, PA-C  naproxen (NAPROSYN) 500 MG tablet Take 1 tablet (500 mg total) by mouth 2 (two) times daily. 12/28/15   Renne Crigler, PA-C    Family History Family  History  Problem Relation Age of Onset  . Hypertension Other     Social History Social History   Tobacco Use  . Smoking status: Current Some Day Smoker  . Smokeless tobacco: Never Used  Substance Use Topics  . Alcohol use: Yes    Comment: occasional  . Drug use: No     Allergies   Patient has no known allergies.   Review of Systems Review of Systems  Gastrointestinal: Negative for abdominal pain.  Genitourinary: Positive for vaginal discharge.  All other systems reviewed and are negative.    Physical Exam Updated Vital Signs BP (!) 173/100 (BP Location: Right Arm)   Pulse 72   Temp 98.5 F (36.9 C) (Oral)   Resp 18   Ht 5\' 2"  (1.575 m)   Wt 63 kg   LMP 12/01/2017 (Exact Date)   SpO2 100%   BMI 25.42 kg/m   Physical Exam  Constitutional: She appears well-developed and well-nourished.  HENT:  Head: Normocephalic and atraumatic.  Eyes: Pupils are equal, round, and reactive to light. Conjunctivae are normal.  Neck: Normal range of motion. Neck supple.  Cardiovascular: Normal rate.  Pulmonary/Chest: Effort  normal.  Abdominal: Soft. Normal appearance and bowel sounds are normal. There is no tenderness.  Genitourinary: Uterus normal. Vaginal discharge found.  Genitourinary Comments: Vaginal discharge,  Thick white,  Adnexa no masses,  Cervix nontender  Musculoskeletal: Normal range of motion.  Neurological: She is alert.  Skin: Skin is warm.  Psychiatric: She has a normal mood and affect.     ED Treatments / Results  Labs (all labs ordered are listed, but only abnormal results are displayed) Labs Reviewed  WET PREP, GENITAL - Abnormal; Notable for the following components:      Result Value   Clue Cells Wet Prep HPF POC PRESENT (*)    WBC, Wet Prep HPF POC FEW (*)    All other components within normal limits  URINALYSIS, ROUTINE W REFLEX MICROSCOPIC - Abnormal; Notable for the following components:   Hgb urine dipstick SMALL (*)    Ketones, ur 15 (*)     Leukocytes, UA TRACE (*)    All other components within normal limits  URINALYSIS, MICROSCOPIC (REFLEX) - Abnormal; Notable for the following components:   Bacteria, UA RARE (*)    All other components within normal limits  PREGNANCY, URINE  GC/CHLAMYDIA PROBE AMP () NOT AT [facility]    EKG None  Radiology No results found.  Procedures Procedures (including critical care time)  Medications Ordered in ED Medications - No data to display   Initial Impression / Assessment and Plan / ED Course  I have reviewed the triage vital signs and the nursing notes.  Pertinent labs & imaging results that were available during my care of the patient were reviewed by me and considered in my medical decision making (see chart for details).     MDM  Wet prep shows many clue cells.  Pt counseled on Bv  Final Clinical Impressions(s) / ED Diagnoses   Final diagnoses:  BV (bacterial vaginosis)    ED Discharge Orders         Ordered    metroNIDAZOLE (FLAGYL) 500 MG tablet  2 times daily     12/21/17 2216        An After Visit Summary was printed and given to the patient.    Elson Areas, PA-C 12/22/17 1507    Melene Plan, DO 12/24/17 [phone]

## 2017-12-25 LAB — GC/CHLAMYDIA PROBE AMP (~~LOC~~) NOT AT ARMC
Chlamydia: NEGATIVE
Neisseria Gonorrhea: NEGATIVE

## 2019-02-23 IMAGING — CR DG FOOT COMPLETE 3+V*R*
3 series · 3 of 3 positions shown · non-contrast
Comparison: None.

CLINICAL DATA: Numbness and heaviness and right foot.

EXAM:
RIGHT FOOT COMPLETE - 3+ VIEW

[x foot ap right]
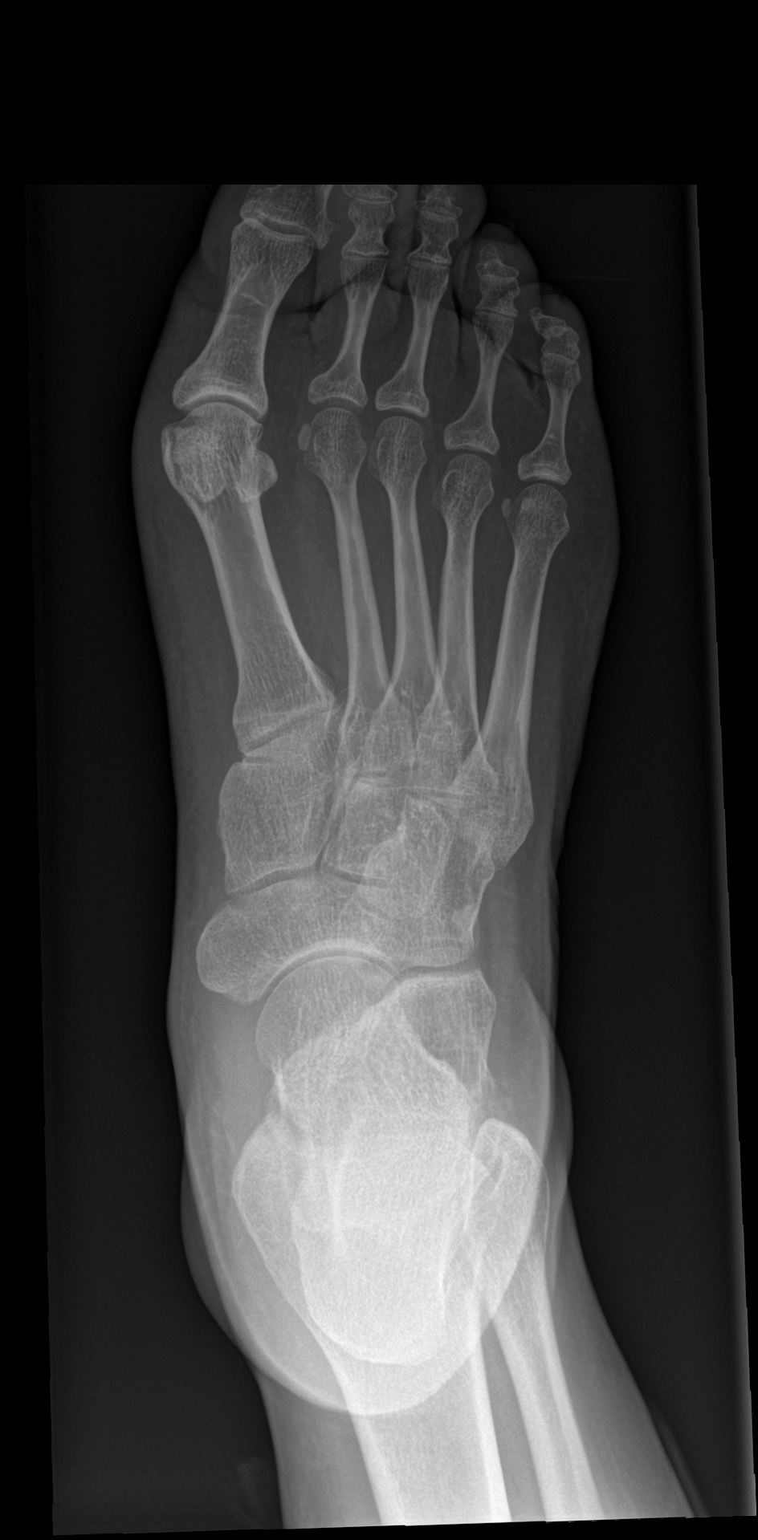

[x foot obl right]
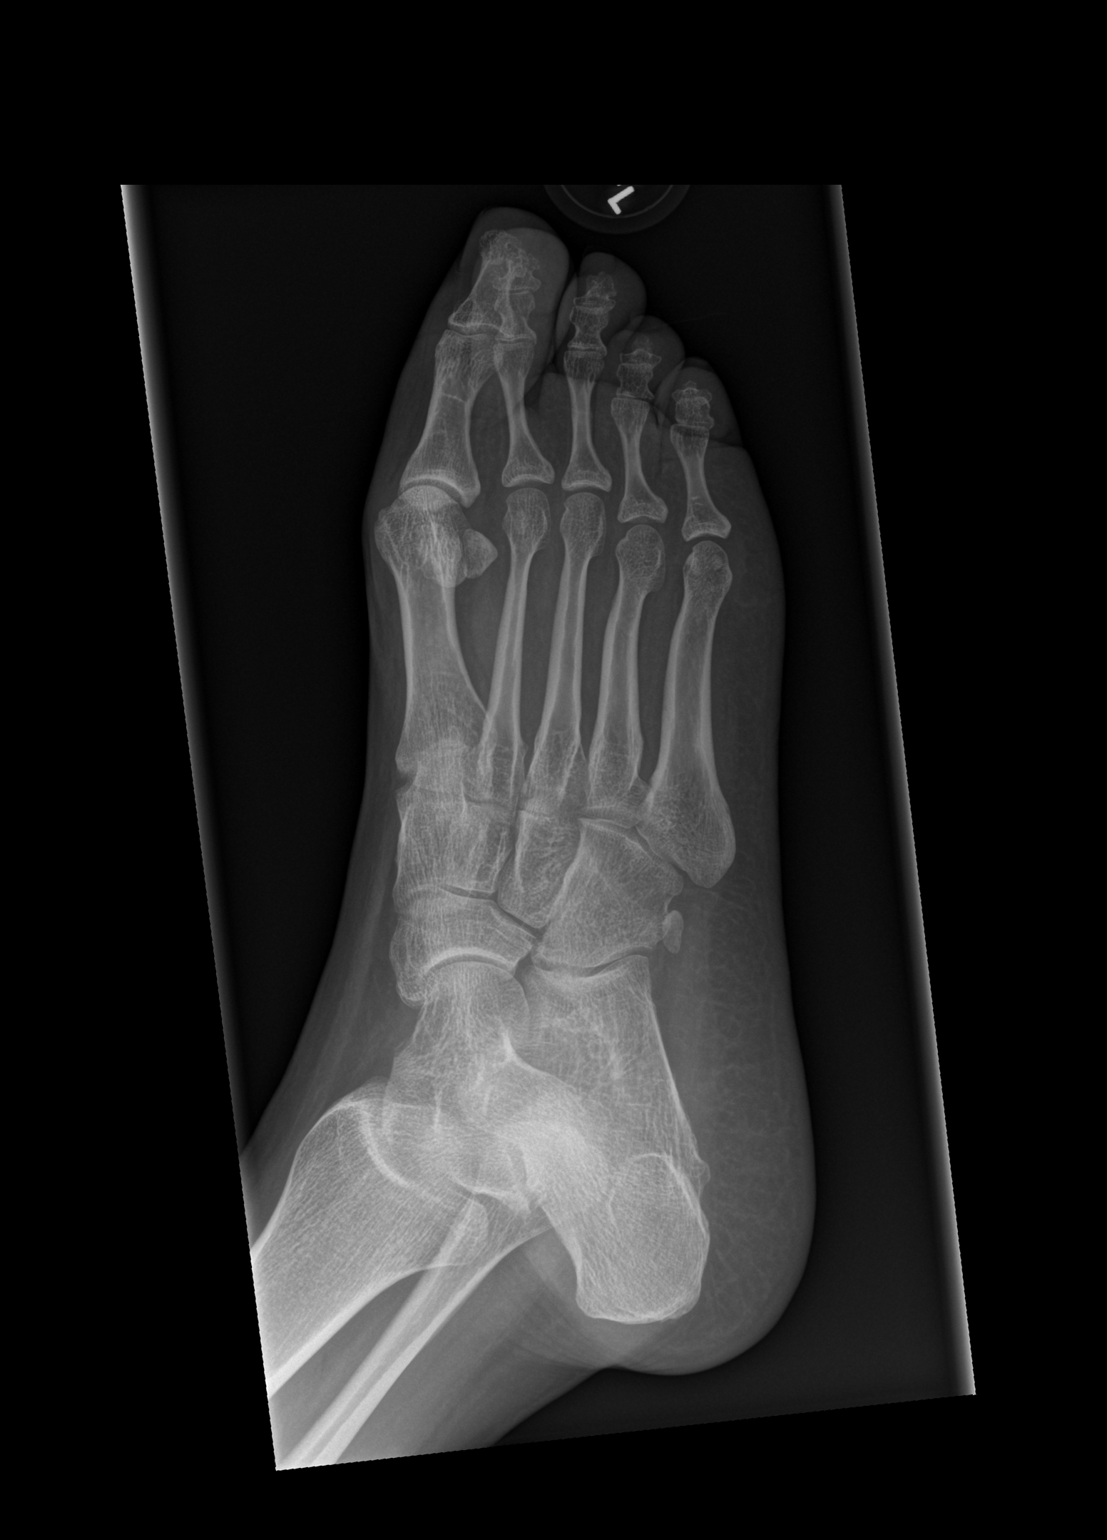

[x foot lat right]
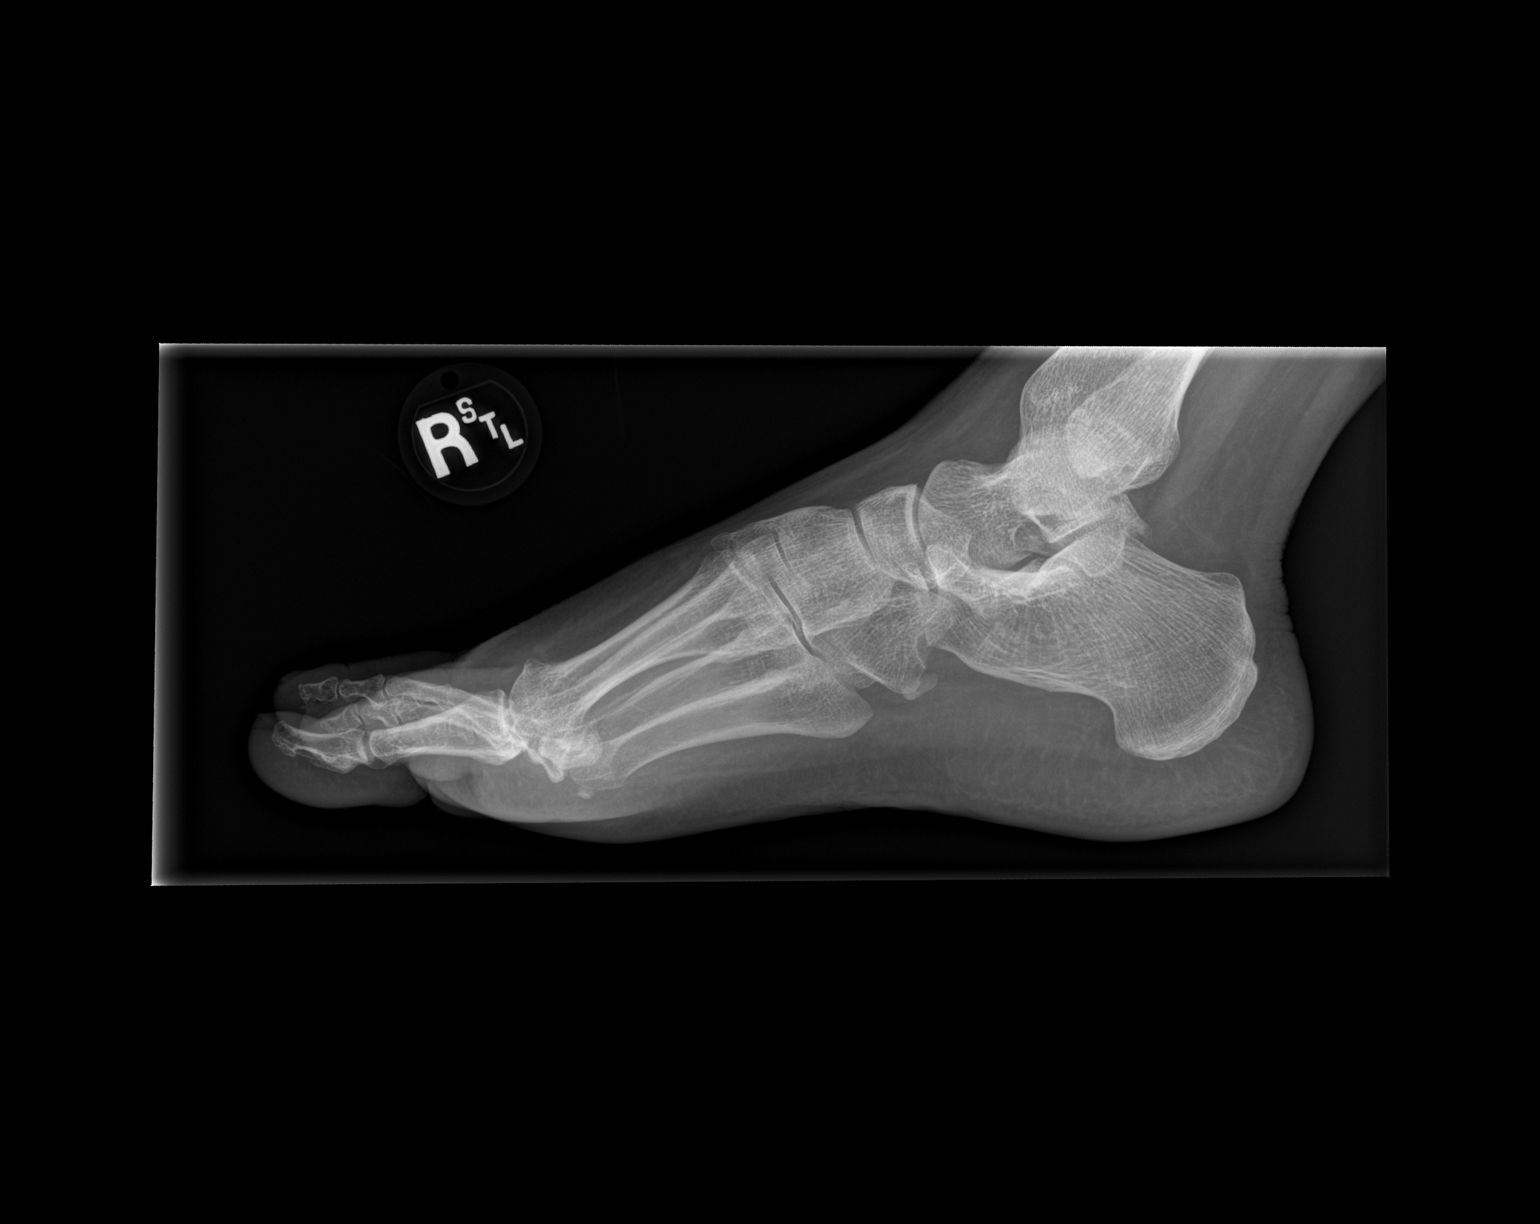

[3 of 3 positions shown; findings below may reference images not displayed]

FINDINGS: Mild hallux valgus deformity without evidence of an acute fracture,
subluxation, or dislocation. No worrisome lytic or sclerotic osseous
abnormality.
IMPRESSION: Negative.

## 2019-03-24 ENCOUNTER — Emergency Department (HOSPITAL_COMMUNITY): Payer: Self-pay

## 2019-03-24 ENCOUNTER — Emergency Department (HOSPITAL_COMMUNITY)
Admission: EM | Admit: 2019-03-24 | Discharge: 2019-03-24 | Disposition: A | Discharge status: Home / Self Care | Payer: Self-pay | Attending: Emergency Medicine | Admitting: Emergency Medicine

## 2019-03-24 ENCOUNTER — Other Ambulatory Visit: Payer: Self-pay

## 2019-03-24 ENCOUNTER — Encounter (HOSPITAL_COMMUNITY): Payer: Self-pay

## 2019-03-24 DIAGNOSIS — R2241 Localized swelling, mass and lump, right lower limb: Secondary | ICD-10-CM | POA: Insufficient documentation

## 2019-03-24 DIAGNOSIS — Z5321 Procedure and treatment not carried out due to patient leaving prior to being seen by health care provider: Secondary | ICD-10-CM | POA: Insufficient documentation

## 2019-03-24 DIAGNOSIS — M79671 Pain in right foot: Secondary | ICD-10-CM | POA: Insufficient documentation

## 2019-03-24 NOTE — ED Triage Notes (Signed)
Pt states that her right foot is heavy and feels "dead". Foot is normal in appearance other than some swelling. NO injury or trauma.

## 2019-03-25 ENCOUNTER — Emergency Department (HOSPITAL_BASED_OUTPATIENT_CLINIC_OR_DEPARTMENT_OTHER): Payer: Medicaid Other

## 2019-03-25 ENCOUNTER — Other Ambulatory Visit: Payer: Self-pay

## 2019-03-25 ENCOUNTER — Emergency Department (HOSPITAL_COMMUNITY): Payer: Medicaid Other

## 2019-03-25 ENCOUNTER — Inpatient Hospital Stay (HOSPITAL_BASED_OUTPATIENT_CLINIC_OR_DEPARTMENT_OTHER)
Admission: EM | Admit: 2019-03-25 | Discharge: 2019-03-30 | DRG: 060 | Disposition: A | Discharge status: Home / Self Care | Payer: Medicaid Other | Attending: Internal Medicine | Admitting: Internal Medicine

## 2019-03-25 ENCOUNTER — Encounter (HOSPITAL_BASED_OUTPATIENT_CLINIC_OR_DEPARTMENT_OTHER): Payer: Self-pay | Admitting: *Deleted

## 2019-03-25 DIAGNOSIS — D509 Iron deficiency anemia, unspecified: Secondary | ICD-10-CM | POA: Diagnosis present

## 2019-03-25 DIAGNOSIS — R030 Elevated blood-pressure reading, without diagnosis of hypertension: Secondary | ICD-10-CM

## 2019-03-25 DIAGNOSIS — D649 Anemia, unspecified: Secondary | ICD-10-CM

## 2019-03-25 DIAGNOSIS — D72829 Elevated white blood cell count, unspecified: Secondary | ICD-10-CM

## 2019-03-25 DIAGNOSIS — M48061 Spinal stenosis, lumbar region without neurogenic claudication: Secondary | ICD-10-CM | POA: Diagnosis present

## 2019-03-25 DIAGNOSIS — G35 Multiple sclerosis: Secondary | ICD-10-CM | POA: Diagnosis not present

## 2019-03-25 DIAGNOSIS — M21371 Foot drop, right foot: Secondary | ICD-10-CM | POA: Diagnosis present

## 2019-03-25 DIAGNOSIS — M4802 Spinal stenosis, cervical region: Secondary | ICD-10-CM | POA: Diagnosis present

## 2019-03-25 DIAGNOSIS — Z20822 Contact with and (suspected) exposure to covid-19: Secondary | ICD-10-CM | POA: Diagnosis present

## 2019-03-25 DIAGNOSIS — F17200 Nicotine dependence, unspecified, uncomplicated: Secondary | ICD-10-CM | POA: Diagnosis present

## 2019-03-25 DIAGNOSIS — R739 Hyperglycemia, unspecified: Secondary | ICD-10-CM | POA: Diagnosis not present

## 2019-03-25 DIAGNOSIS — M47812 Spondylosis without myelopathy or radiculopathy, cervical region: Secondary | ICD-10-CM | POA: Diagnosis present

## 2019-03-25 LAB — CBC WITH DIFFERENTIAL/PLATELET
Abs Immature Granulocytes: 0.02 10*3/uL (ref 0.00–0.07)
Basophils Absolute: 0 10*3/uL (ref 0.0–0.1)
Basophils Relative: 1 %
Eosinophils Absolute: 0.1 10*3/uL (ref 0.0–0.5)
Eosinophils Relative: 1 %
HCT: 35.8 % — ABNORMAL LOW (ref 36.0–46.0)
Hemoglobin: 11.3 g/dL — ABNORMAL LOW (ref 12.0–15.0)
Immature Granulocytes: 0 %
Lymphocytes Relative: 22 %
Lymphs Abs: 1.7 10*3/uL (ref 0.7–4.0)
MCH: 27.1 pg (ref 26.0–34.0)
MCHC: 31.6 g/dL (ref 30.0–36.0)
MCV: 85.9 fL (ref 80.0–100.0)
Monocytes Absolute: 0.7 10*3/uL (ref 0.1–1.0)
Monocytes Relative: 9 %
Neutro Abs: 5.3 10*3/uL (ref 1.7–7.7)
Neutrophils Relative %: 67 %
Platelets: 303 10*3/uL (ref 150–400)
RBC: 4.17 MIL/uL (ref 3.87–5.11)
RDW: 17.9 % — ABNORMAL HIGH (ref 11.5–15.5)
WBC: 7.8 10*3/uL (ref 4.0–10.5)
nRBC: 0 % (ref 0.0–0.2)

## 2019-03-25 LAB — URINALYSIS, ROUTINE W REFLEX MICROSCOPIC
Bilirubin Urine: NEGATIVE
Glucose, UA: NEGATIVE mg/dL
Ketones, ur: 5 mg/dL — AB
Leukocytes,Ua: NEGATIVE
Nitrite: NEGATIVE
Protein, ur: NEGATIVE mg/dL
RBC / HPF: >50 RBC/hpf — ABNORMAL HIGH (ref 0–5)
Specific Gravity, Urine: 1.027 (ref 1.005–1.030)
pH: 6 (ref 5.0–8.0)

## 2019-03-25 LAB — SEDIMENTATION RATE: Sed Rate: 11 mm/hr (ref 0–22)

## 2019-03-25 LAB — BASIC METABOLIC PANEL
Anion gap: 8 (ref 5–15)
BUN: 7 mg/dL (ref 6–20)
CO2: 26 mmol/L (ref 22–32)
Calcium: 9.2 mg/dL (ref 8.9–10.3)
Chloride: 108 mmol/L (ref 98–111)
Creatinine, Ser: 0.57 mg/dL (ref 0.44–1.00)
GFR calc Af Amer: >60 mL/min (ref >60)
GFR calc non Af Amer: >60 mL/min (ref >60)
Glucose, Bld: 99 mg/dL (ref 70–99)
Potassium: 3.6 mmol/L (ref 3.5–5.1)
Sodium: 142 mmol/L (ref 135–145)

## 2019-03-25 LAB — SARS CORONAVIRUS 2 (TAT 6-24 HRS): SARS Coronavirus 2: NEGATIVE

## 2019-03-25 LAB — C-REACTIVE PROTEIN: CRP: 0.6 mg/dL (ref <1.0)

## 2019-03-25 LAB — PREGNANCY, URINE: Preg Test, Ur: NEGATIVE

## 2019-03-25 IMAGING — DX DG CHEST 1V PORT
1 series · 1 of 1 positions shown · non-contrast
Comparison: [DATE]

CLINICAL DATA: Steroid therapy with concern for pneumonia

EXAM:
PORTABLE CHEST 1 VIEW

[chest]
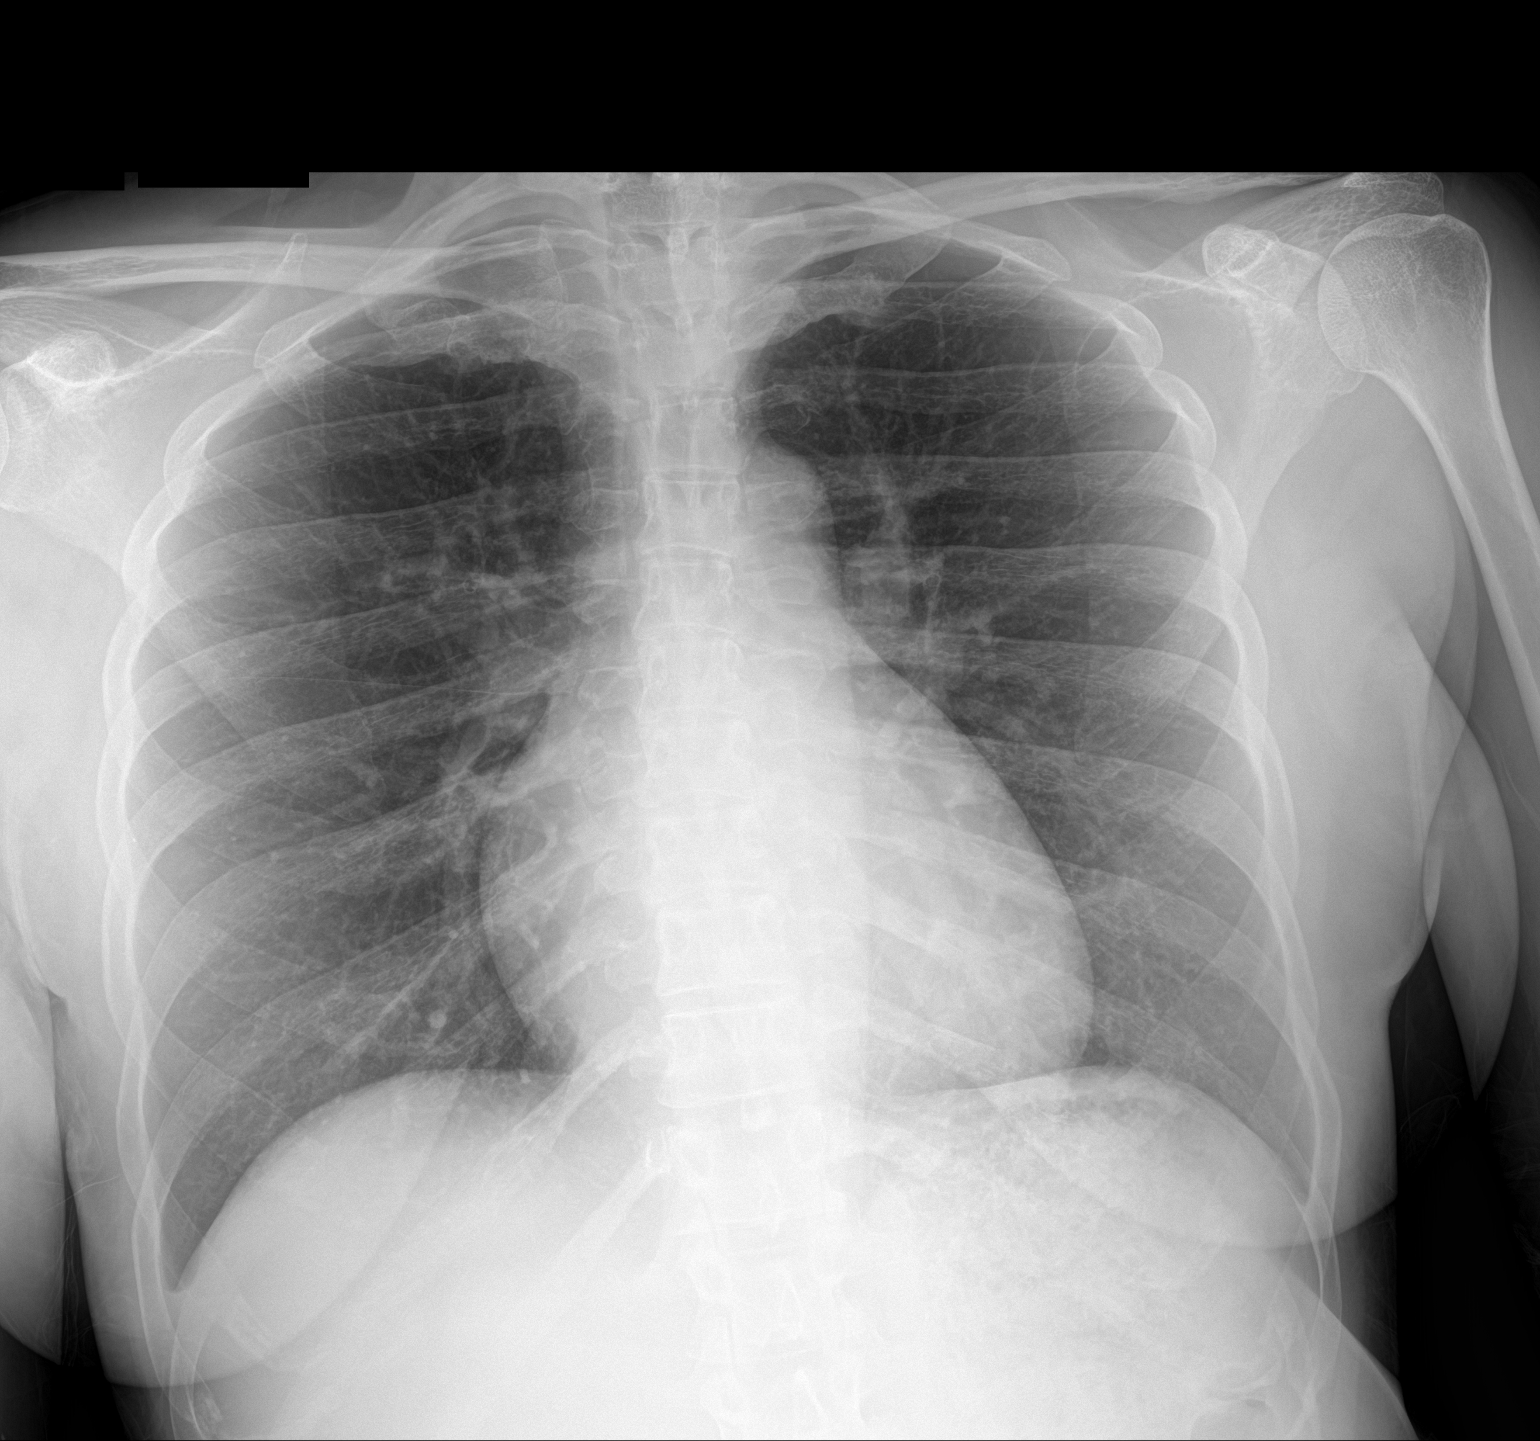

[1 of 1 positions shown; findings below may reference images not displayed]

FINDINGS: Lungs are clear. Heart size and pulmonary vascularity are within
normal limits. No adenopathy. No bone lesions.
IMPRESSION: No edema or consolidation.  No evident adenopathy.

## 2019-03-25 IMAGING — MR MR HEAD WO/W CM
10 of 17 series · 29 of 48 positions shown · IV contrast (gadavist)
Comparison: Head CT [DATE]

CLINICAL DATA: Foot numbness and weakness. Question multiple
sclerosis. Symptoms began 5-7 days ago.

EXAM:
MRI HEAD WITHOUT AND WITH CONTRAST
TECHNIQUE: Multiplanar, multiecho pulse sequences of the brain and surrounding
structures were obtained without and with intravenous contrast.
CONTRAST:  5 cc Gadavist

[Series 3: DWI · axial · 3.0mm · 1.09mm/px · z∈[-81,+60]mm · 6 of 104 slices shown (1 of 4)]
[im 1/104]
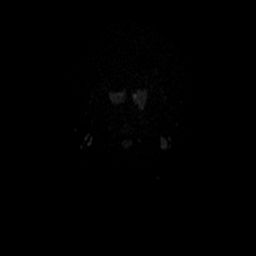
[im 21/104]
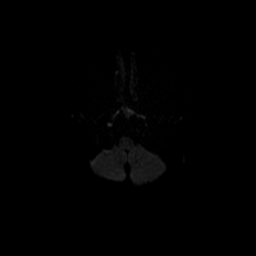
[im 42/104]
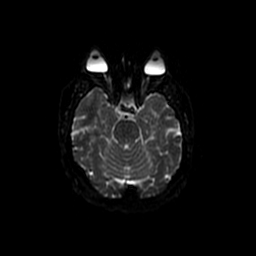
[im 62/104]
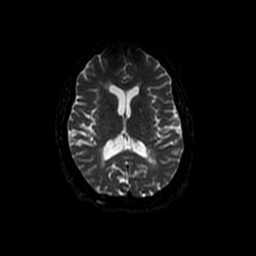
[im 83/104]
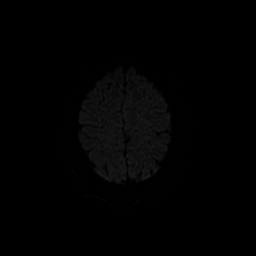
[im 104/104]
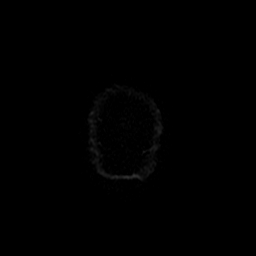

[Series 4: DWI · coronal · 5.0mm · 1.09mm/px · 4 of 72 slices shown (2 of 4)]
[im 1/72]
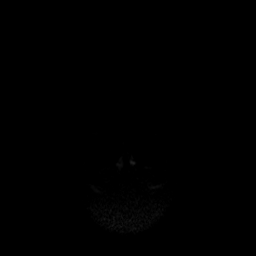
[im 24/72]
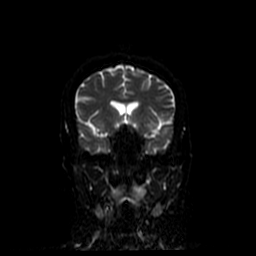
[im 48/72]
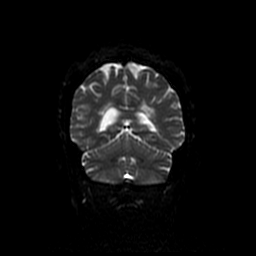
[im 72/72]
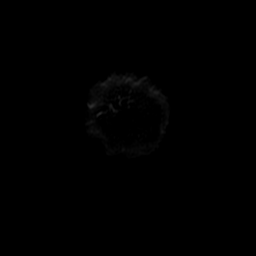

[Series 5: FLAIR · axial · 3.0mm · 0.45mm/px · 1 of 26 slices shown (1 of 2)]
[im 1/26]
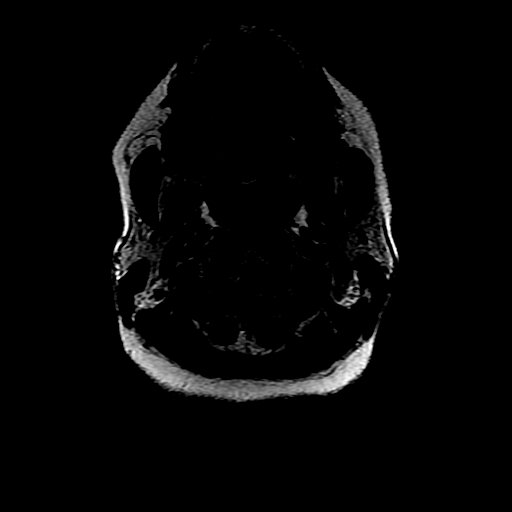

[Series 8: T2 · axial · 5.0mm · 0.45mm/px · 1 of 26 slices shown]
[im 1/26]
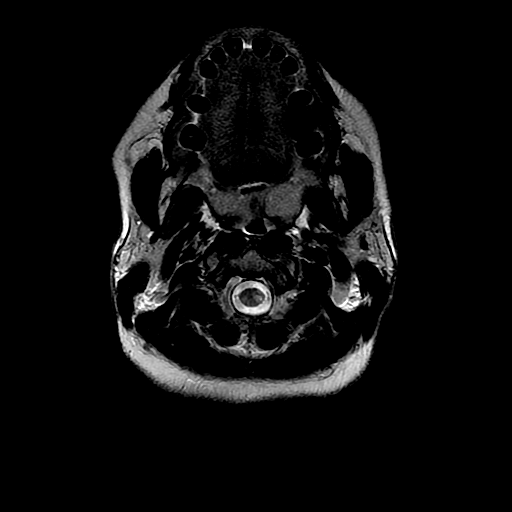

[Series 10: FLAIR · sagittal · 1.2mm · 0.49mm/px · 9 of 252 slices shown (2 of 2)]
[im 1/252]
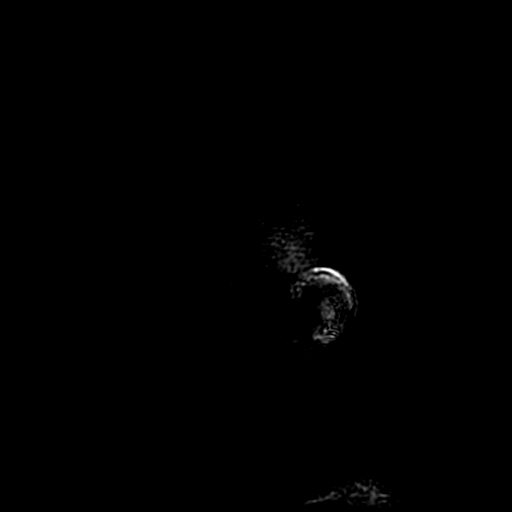
[im 46/252]
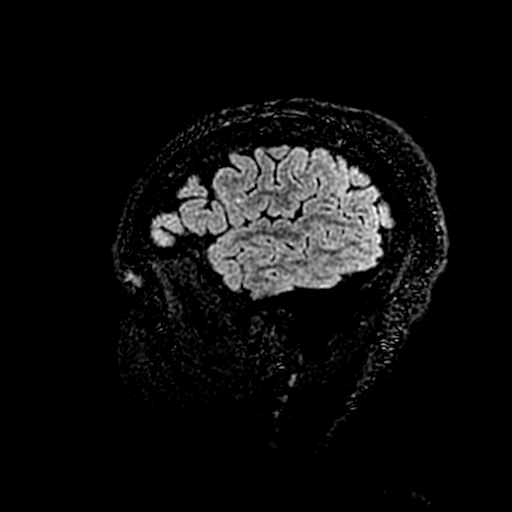
[im 69/252]
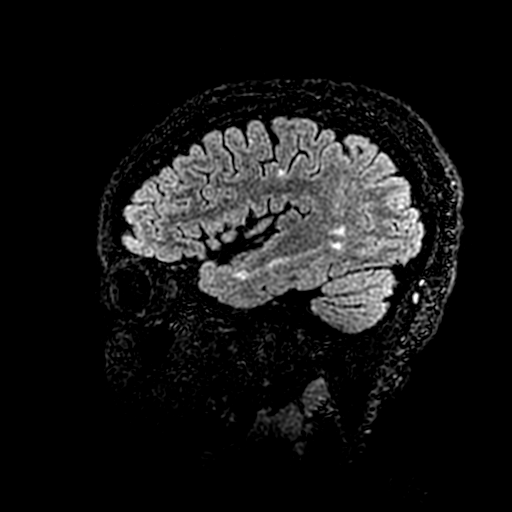
[im 115/252]
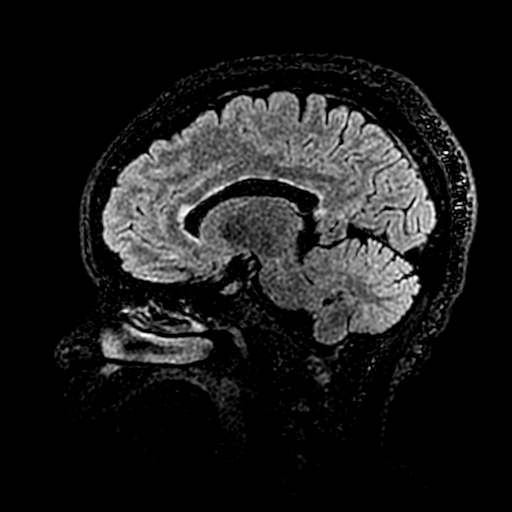
[im 137/252]
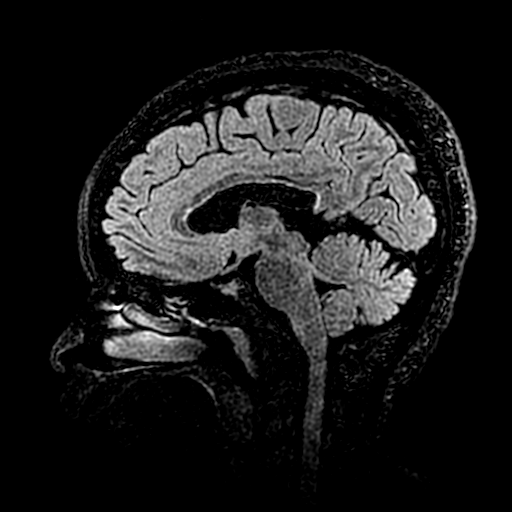
[im 183/252]
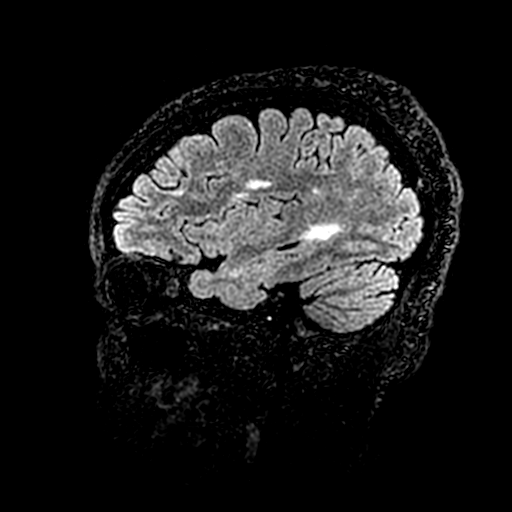
[im 206/252]
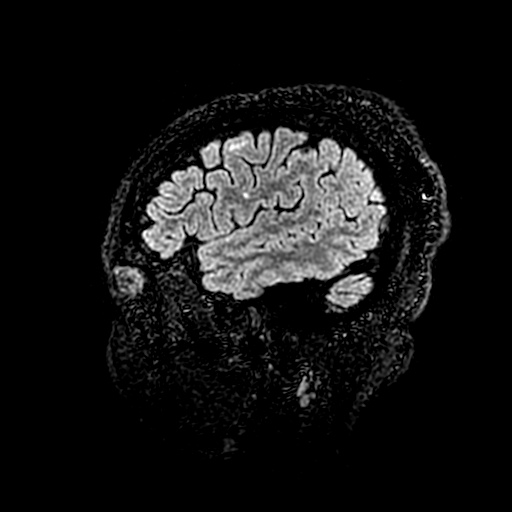
[im 229/252]
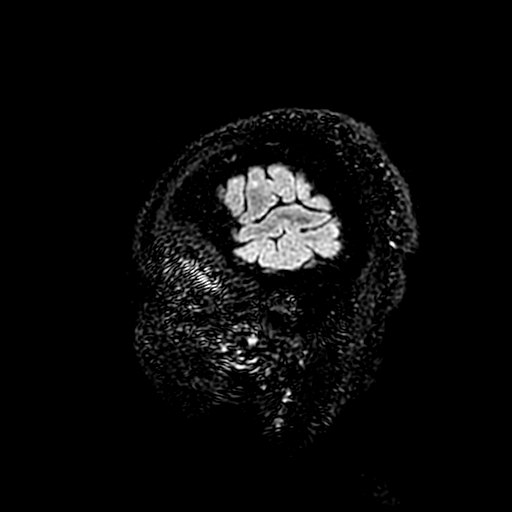
[im 252/252]
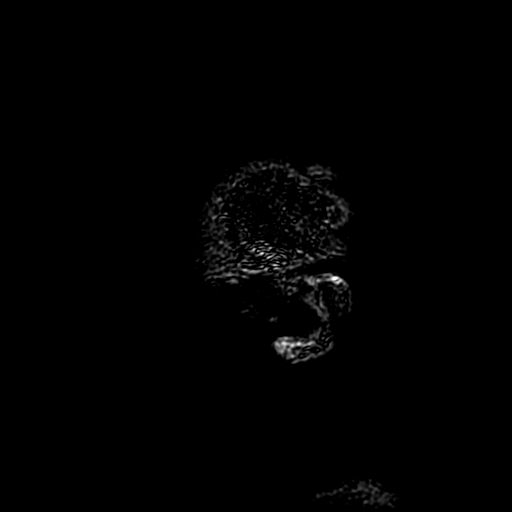

[Series 11: T2 post-contrast · coronal · 5.0mm · 0.39mm/px · 1 of 28 slices shown]
[im 1/28]
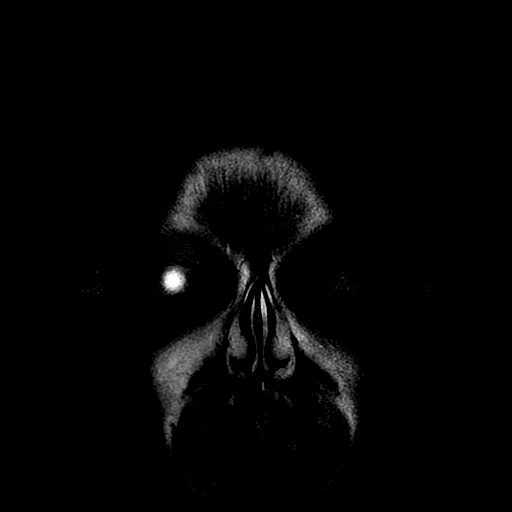

[Series 13: T1 post-contrast · coronal · 5.0mm · 0.45mm/px · 1 of 28 slices shown (1 of 2)]
[im 1/28]
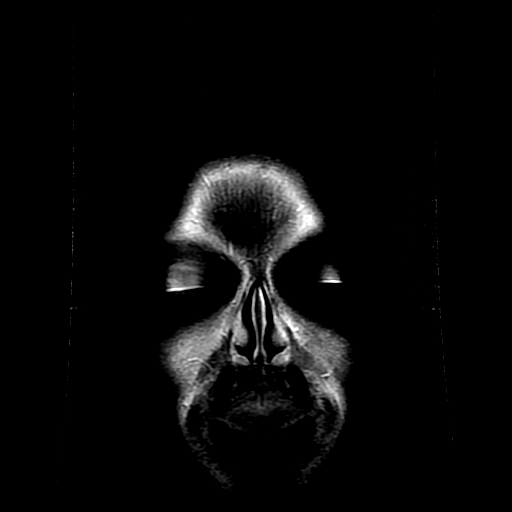

[Series 14: T1 post-contrast · sagittal · 5.0mm · 0.47mm/px · 1 of 25 slices shown (2 of 2)]
[im 1/25]
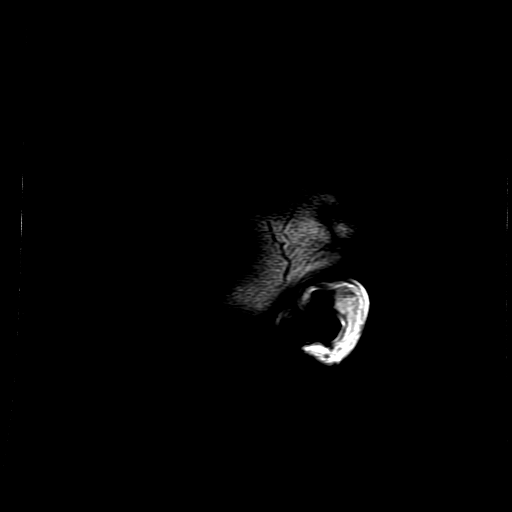

[Series 300: DWI · axial · 3.0mm · 1.09mm/px · z∈[-81,+60]mm · 3 of 52 slices shown (3 of 4)]
[im 1/52]
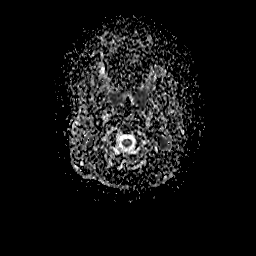
[im 26/52]
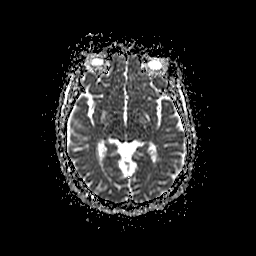
[im 52/52]
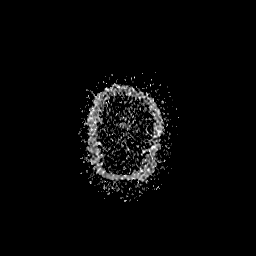

[Series 400: DWI · coronal · 5.0mm · 1.09mm/px · 2 of 36 slices shown (4 of 4)]
[im 1/36]
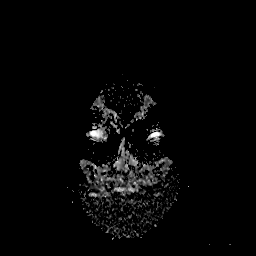
[im 36/36]
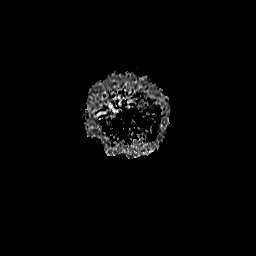

[29 of 48 positions shown; findings below may reference images not displayed]

FINDINGS: Brain: Diffusion imaging does not show any acute or subacute
infarction or other cause of restricted diffusion. There are
multiple foci of abnormal T2 and FLAIR signal with involvement of
the upper cervical spinal cord, middle cerebellar peduncles, and
extensive involvement throughout the deep and subcortical white
matter of both cerebral hemispheres. The lesions are typical of
multiple sclerosis lesions. No sign of ischemic infarction. No mass
lesion, hemorrhage, hydrocephalus or extra-axial collection. Low
level enhancement is seen only along the margins of a white matter
lesion in the forceps major area on the left.

Vascular: Major vessels at the base of the brain show flow.

Skull and upper cervical spine: Negative

Sinuses/Orbits: Clear/normal

Other: None
IMPRESSION: Extensive widespread findings of multiple sclerosis. Multiple white
matter lesions of the cerebral hemispheres affecting the deep and
subcortical white matter. More subtle lesions affecting the
cerebellum in the middle cerebellar peduncle regions and affecting
the upper cervical spinal cord as visualized. No lesions show
restricted diffusion. Low level contrast enhancement along the
margins of a white matter lesion in the left forceps major region.

## 2019-03-25 IMAGING — DX DG FOOT COMPLETE 3+V*R*
3 series · 3 of 3 positions shown · non-contrast
Comparison: None.

CLINICAL DATA: Numbness of the right foot over the last week.
Unable to move the foot.

EXAM:
RIGHT FOOT COMPLETE - 3+ VIEW

[foot ap]
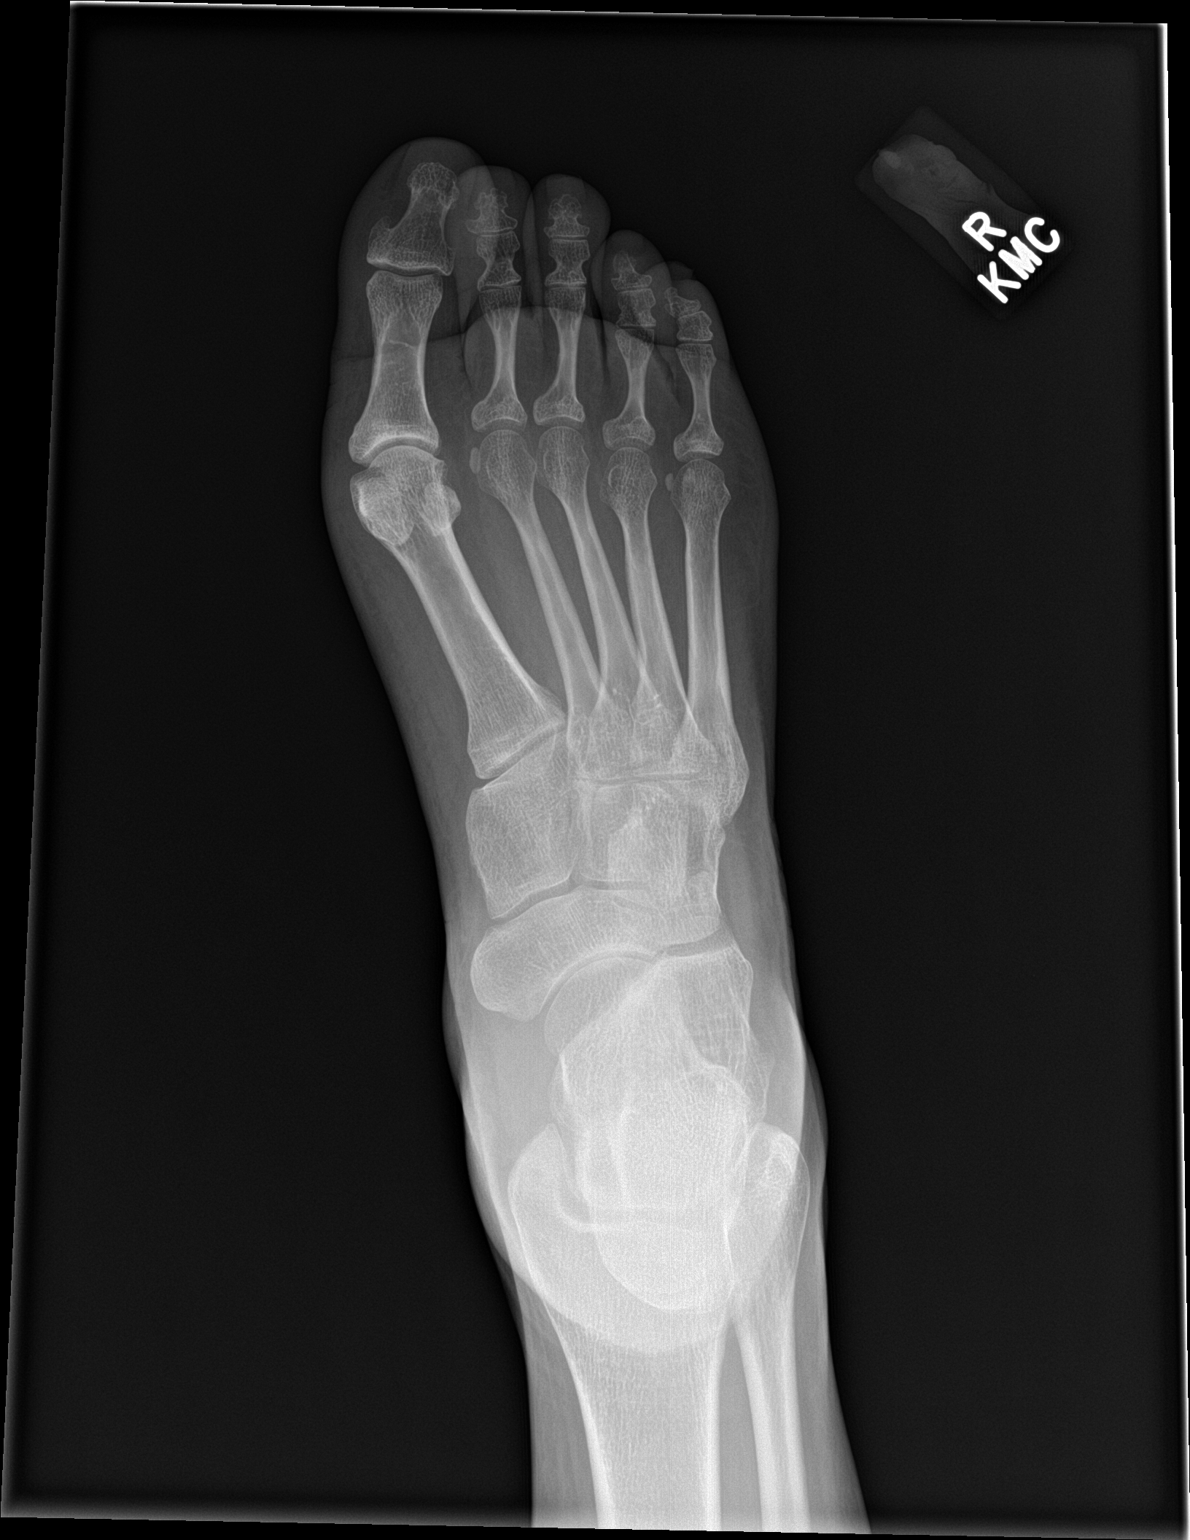

[foot obl]
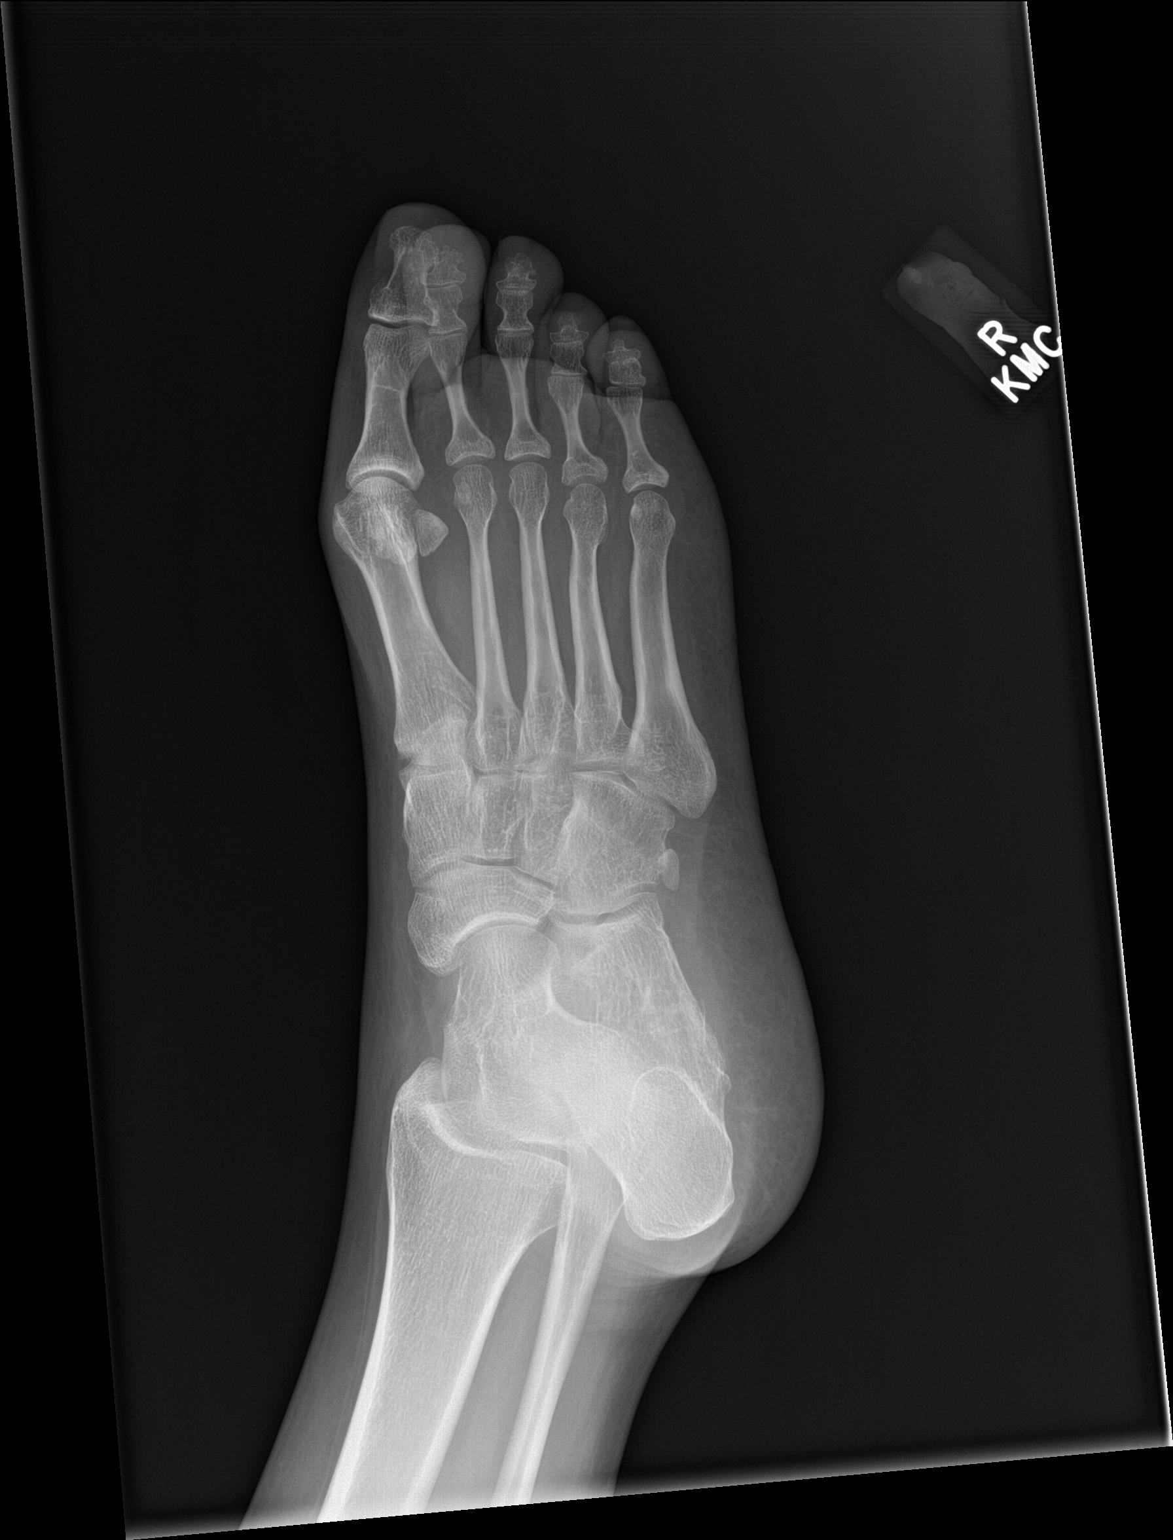

[foot lat]
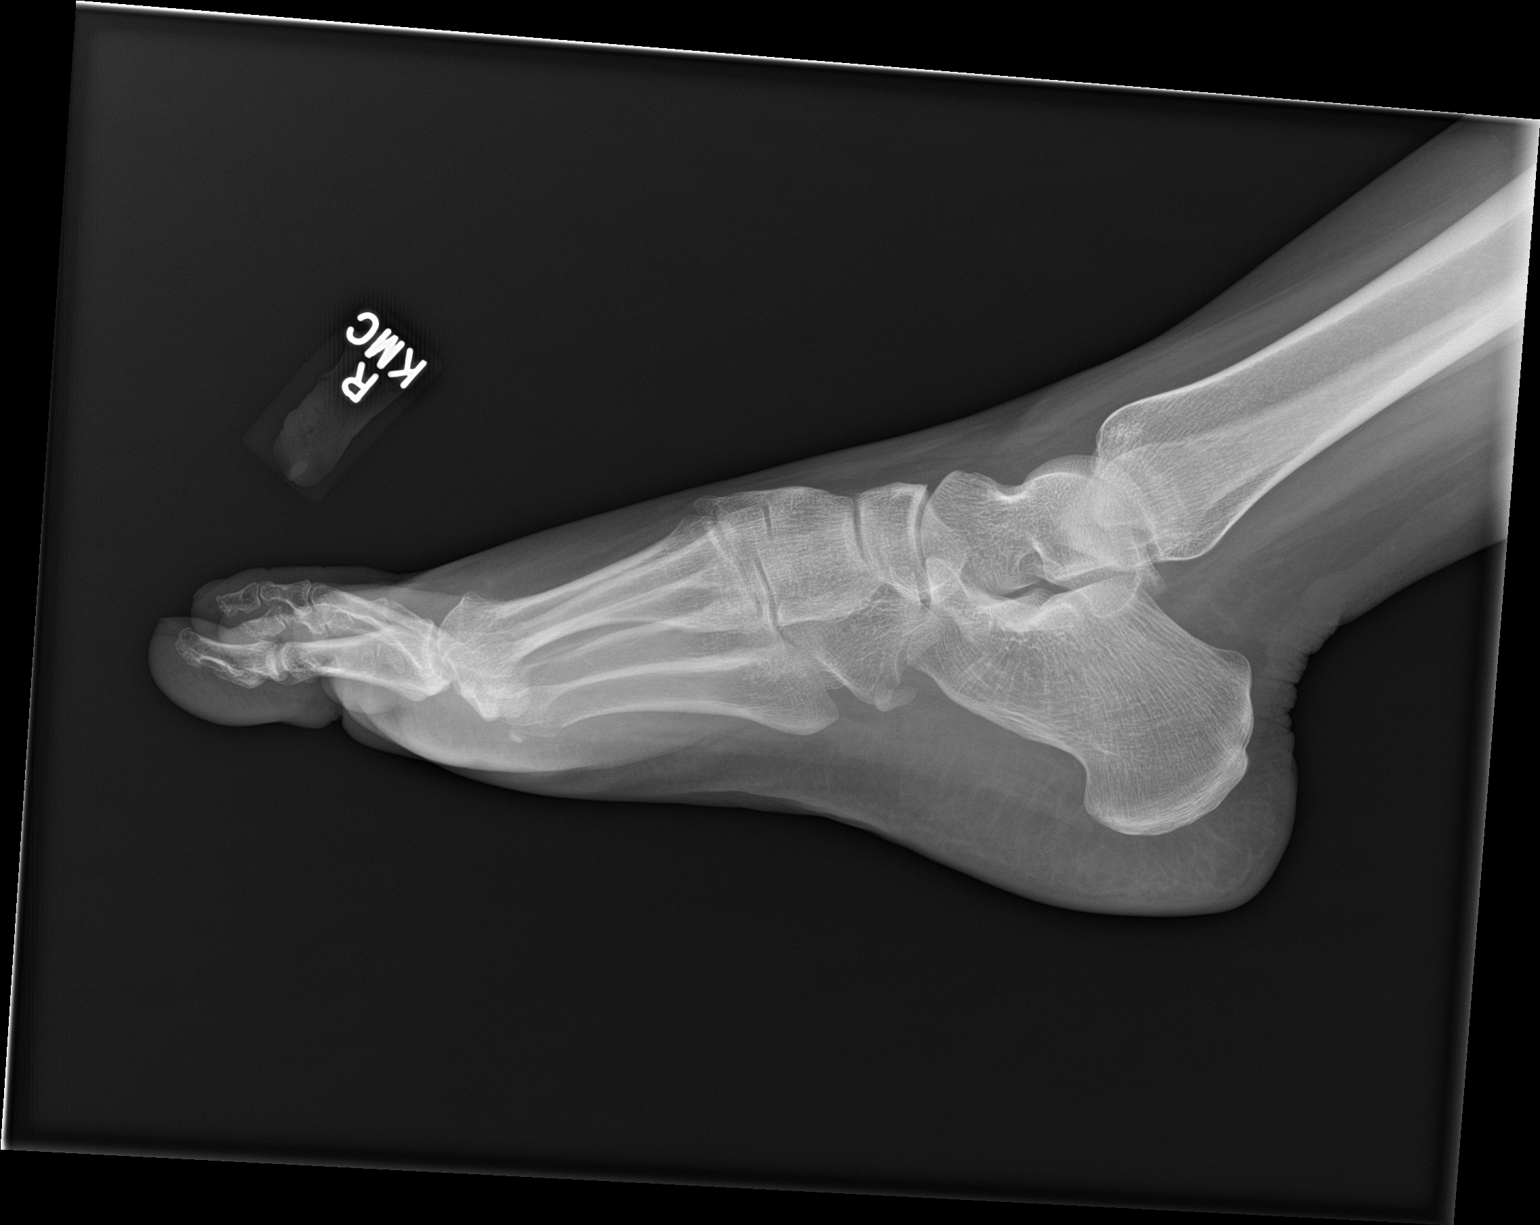

[3 of 3 positions shown; findings below may reference images not displayed]

FINDINGS: There is no evidence of fracture or dislocation. There is no
evidence of arthropathy or other focal bone abnormality. Soft
tissues are unremarkable.
IMPRESSION: Negative.

## 2019-03-25 MED ORDER — ONDANSETRON HCL 4 MG/2ML IJ SOLN: 4.0000 mg | Freq: Four times a day (QID) | INTRAMUSCULAR | Status: DC | PRN

## 2019-03-25 MED ORDER — ACETAMINOPHEN 650 MG RE SUPP: 650.0000 mg | Freq: Four times a day (QID) | RECTAL | Status: DC | PRN

## 2019-03-25 MED ORDER — SODIUM CHLORIDE 0.9 % IV SOLN: INTRAVENOUS | Status: DC

## 2019-03-25 MED ORDER — ACETAMINOPHEN 325 MG PO TABS
650.0000 mg | ORAL_TABLET | Freq: Four times a day (QID) | ORAL | Status: DC | PRN
  Administered 2019-03-30: 650 mg via ORAL
  Filled 2019-03-25: qty 2

## 2019-03-25 MED ORDER — ONDANSETRON HCL 4 MG PO TABS: 4.0000 mg | ORAL_TABLET | Freq: Four times a day (QID) | ORAL | Status: DC | PRN

## 2019-03-25 MED ORDER — GADOBUTROL 1 MMOL/ML IV SOLN
5.0000 mL | Freq: Once | INTRAVENOUS | Status: AC | PRN
  Administered 2019-03-25: 5 mL via INTRAVENOUS

## 2019-03-25 MED ORDER — ENOXAPARIN SODIUM 40 MG/0.4ML ~~LOC~~ SOLN
40.0000 mg | Freq: Every day | SUBCUTANEOUS | Status: DC
  Administered 2019-03-25 – 2019-03-29 (×5): 40 mg via SUBCUTANEOUS
  Filled 2019-03-25 (×5): qty 0.4

## 2019-03-25 NOTE — ED Provider Notes (Addendum)
[place] EMERGENCY DEPARTMENT Provider Note   CSN: [id number] Arrival date & time: 03/25/19  1018     History Chief Complaint  Patient presents with  . Foot Pain    Alicia Rojas is a 33 y.o. female.  HPI Patient reports she started to get numbness of her right foot 5 to 7 days ago.  She reports it was numb and making it hard for her to walk.  The whole foot feels numb.  It stops at about her ankle.  She does not perceive numbness or tingling of the lower portion of the leg.  She reports she has the pick the foot up extra high to avoid dragging her foot and falling..  She reports that the foot feels numb when she tries to step down on or put pressure on it.  She denies any injury.  She denies it is actually very painful.  She does perceive that there may be a little bit of swelling over the top of the foot.  She denies any prior history of weakness or numbness of her feet.  Patient does note that some 8 to 10 years ago, she got numbness and droop on the right side of her face that was diagnosed as Bell's palsy but without any further diagnostic studies, at about that same time she also got numbness of her right hand which she reports was diagnosed as carpal tunnel.  Patient denies she is having any other neurologic problems.  She is not having any headaches, no blurred vision, double vision or loss of vision, no upper extremity weakness numbness or tingling.  Patient denies she is having any problems with back pain.  She was hoping the symptoms would just get better.  She finally decided to go to [facility] long emergency department last night but ended up waiting 6 hours and decided to come back in the morning.  Patient does not have any primary care physician.  Family history is positive for her mother having MS.    Past Medical History:  Diagnosis Date  . Chlamydia   . Gestational hypertension   . No pertinent past medical history   . Trichimoniasis     Patient Active  Problem List   Diagnosis Date Noted  . Normal delivery 01/05/2011  . Threatened preterm labor 12/22/2010    Past Surgical History:  Procedure Laterality Date  . MULTIPLE TOOTH EXTRACTIONS       OB History    Gravida  4   Para  4   Term  4   Preterm  0   AB  0   Living  4     SAB  0   TAB  0   Ectopic  0   Multiple  0   Live Births  4           Family History  Problem Relation Age of Onset  . Hypertension Other     Social History   Tobacco Use  . Smoking status: Current Some Day Smoker  . Smokeless tobacco: Never Used  Substance Use Topics  . Alcohol use: Yes    Comment: occasional  . Drug use: No    Home Medications Prior to Admission medications   Medication Sig Start Date End Date Taking? Authorizing Provider  amLODipine (NORVASC) 10 MG tablet Take 1 tablet (10 mg total) by mouth daily. 02/23/15   Keitha Butte, CNM  amLODipine (NORVASC) 10 MG tablet Take 1 tablet (10 mg total) by  mouth daily. 10/14/15   West Pugh, NP  metroNIDAZOLE (FLAGYL) 500 MG tablet Take 1 tablet (500 mg total) by mouth 2 (two) times daily. 12/21/17   Fransico Meadow, PA-C  naproxen (NAPROSYN) 500 MG tablet Take 1 tablet (500 mg total) by mouth 2 (two) times daily. 12/28/15   Carlisle Cater, PA-C    Allergies    Patient has no known allergies.  Review of Systems   Review of Systems 10 Systems reviewed and are negative for acute change except as noted in the HPI.  Physical Exam Updated Vital Signs BP (!) 185/98 (BP Location: Right Arm)   Pulse 88   Temp 99.4 F (37.4 C) (Oral)   Resp 18   Ht 5' 1"  (1.549 m)   Wt 59 kg   LMP 03/24/2019   SpO2 100%   BMI 24.56 kg/m   Physical Exam Constitutional:      Comments: Alert and appropriate.  Clinically well in appearance.  Well-nourished well-developed.  HENT:     Head: Normocephalic and atraumatic.     Nose: Nose normal.     Mouth/Throat:     Mouth: Mucous membranes are moist.     Pharynx: Oropharynx  is clear.  Eyes:     Extraocular Movements: Extraocular movements intact.     Conjunctiva/sclera: Conjunctivae normal.     Pupils: Pupils are equal, round, and reactive to light.  Cardiovascular:     Rate and Rhythm: Normal rate and regular rhythm.     Pulses: Normal pulses.     Heart sounds: Normal heart sounds.  Pulmonary:     Effort: Pulmonary effort is normal.     Breath sounds: Normal breath sounds.  Abdominal:     General: There is no distension.     Palpations: Abdomen is soft.     Tenderness: There is no abdominal tenderness. There is no guarding.  Musculoskeletal:     Cervical back: Neck supple.     Comments: Right foot may have very subtle fullness over the dorsum as compared to the left.  No really appreciable edema, skin changes and no erythema.  Both feet feel slightly cool but have positive cap refill.  I did have to use hand-held Doppler to find pulses which were present in both feet and symmetric.  No swelling or pain to the lower portions of the legs.  The feet are in very good condition without wounds.  Skin:    Capillary Refill: Capillary refill takes less than 2 seconds.  Neurological:     Comments: Mental status is clear.  Alert and appropriate.  Cognitive function normal.  Cranial nerves II through XII intact.  Grip strength symmetric bilateral upper extremities.  Patient endorses intact sensation to light touch bilateral upper extremities.  Lower extremities patient has numbness to touch over the right foot.  She does have symmetric and downgoing toes to stroke of the sole.  Slight weakness to dorsiflexion plantarflexion on the right as opposed to the left.  Patellar reflexes are 2+ and symmetric.  I did have the patient stand and ambulate.  With ambulation she does have a slightly awkward gait and accommodating what appears to be a toe drop on the right.     ED Results / Procedures / Treatments   Labs (all labs ordered are listed, but only abnormal results are  displayed) Labs Reviewed - No data to display  EKG None  Radiology DG Foot Complete Right  Result Date: 03/24/2019 CLINICAL DATA:  Numbness  and heaviness and right foot. EXAM: RIGHT FOOT COMPLETE - 3+ VIEW COMPARISON:  None. FINDINGS: Mild hallux valgus deformity without evidence of an acute fracture, subluxation, or dislocation. No worrisome lytic or sclerotic osseous abnormality. IMPRESSION: Negative. Electronically Signed   By: Misty Stanley M.D.   On: 03/24/2019 14:47    Procedures Procedures (including critical care time)  Medications Ordered in ED Medications - No data to display  ED Course  I have reviewed the triage vital signs and the nursing notes.  Pertinent labs & imaging results that were available during my care of the patient were reviewed by me and considered in my medical decision making (see chart for details).  Clinical Course as of Apr 07 1036  Mon Mar 25, 2019  1647 DG Foot Complete Right [WF]  1647 Extensive widespread findings of multiple sclerosis. Multiple white matter lesions of the cerebral hemispheres affecting the deep and subcortical white matter. More subtle lesions affecting the cerebellum in the middle cerebellar peduncle regions and affecting the upper cervical spinal cord as visualized. No lesions show restricted diffusion. Low level contrast enhancement along the margins of a white matter lesion in the left forceps major region.    MR Brain W and Wo Contrast [WF]  1916 No leukocytosis.  CBC with Differential(!) [WF]  1916 Negative for electrolyte abnormalities  Basic metabolic panel [WF]  1470 CRP ESR negative.   [WF]    Clinical Course User Index [WF] Tedd Sias, PA   MDM Rules/Calculators/A&P                      Consult: Reviewed with Dr. Sherry Ruffing for transfer to [facility] emergency department for MRI. Consult: Reviewed with Dr. Malen Gauze to confirm choice of MRI with and without contrast of the brain for anticipated work-up  of possible MS.  Advises otherwise this may be a peripheral neuropathy. Final Clinical Impression(s) / ED Diagnoses Final diagnoses:  None  Patient presents as outlined above.  She is clinically well in appearance.  This foot drop\numbness has been present for greater than 5 days.  I have low suspicion for CVA as the etiology.  Patient's mother does have history of MS.  Given her history some 8 years ago of facial numbness and extremity dysfunction, I have concern for possible MS.  Will need to proceed with MRI.  At that time if all is normal likely can consider outpatient work-up for peripheral neuropathy.  Rx / DC Orders ED Discharge Orders    None       Charlesetta Shanks, MD 03/25/19 1213    Charlesetta Shanks, MD 04/08/19 1039

## 2019-03-25 NOTE — ED Notes (Signed)
RN called MRI to see let them know pt transferred and will need study.

## 2019-03-25 NOTE — ED Notes (Signed)
Pt being admitted - will talk with pt concerning admit

## 2019-03-25 NOTE — ED Notes (Signed)
ED TO INPATIENT HANDOFF REPORT  ED Nurse Name and Phone #: Koleen Nimrod [phone]  S Name/Age/Gender Alicia Rojas 33 y.o. female Room/Bed: 052C/052C  Code Status   Code Status: Not on file  Home/SNF/Other Home Patient oriented to: self, place, time and situation Is this baseline? Yes   Triage Complete: Triage complete  Chief Complaint Multiple sclerosis (HCC) [G35]  Triage Note Pt reports right foot numbness and inability to move it x 1 week. Ambulatory. Left AMA from [facility] yesterday. Denies injury. Slight swelling in foot.     Allergies No Known Allergies  Level of Care/Admitting Diagnosis ED Disposition    ED Disposition Condition Comment   Admit  Hospital Area: [facility] [[id number]]  Level of Care: Med-Surg [16]  Covid Evaluation: Asymptomatic Screening Protocol (No Symptoms)  Diagnosis: Multiple sclerosis (HCC) [340.ICD-9-CM]  Admitting Physician: Rometta Emery [[id number]]  Attending Physician: Rometta Emery [[id number]]  Estimated length of stay: past midnight tomorrow  Certification:: I certify this patient will need inpatient services for at least 2 midnights       B Medical/Surgery History Past Medical History:  Diagnosis Date  . Chlamydia   . Gestational hypertension   . No pertinent past medical history   . Trichimoniasis    Past Surgical History:  Procedure Laterality Date  . MULTIPLE TOOTH EXTRACTIONS       A IV Location/Drains/Wounds Patient Lines/Drains/Airways Status   Active Line/Drains/Airways    None          Intake/Output Last 24 hours No intake or output data in the 24 hours ending 03/25/19 2025  Labs/Imaging Results for orders placed or performed during the hospital encounter of 03/25/19 (from the past 48 hour(s))  Basic metabolic panel     Status: None   Collection Time: 03/25/19 10:55 AM  Result Value Ref Range   Sodium 142 135 - 145 mmol/L   Potassium 3.6 3.5 - 5.1 mmol/L   Chloride 108 98 - 111  mmol/L   CO2 26 22 - 32 mmol/L   Glucose, Bld 99 70 - 99 mg/dL   BUN 7 6 - 20 mg/dL   Creatinine, Ser 7.49 0.44 - 1.00 mg/dL   Calcium 9.2 8.9 - 44.9 mg/dL   GFR calc non Af Amer >60 >60 mL/min   GFR calc Af Amer >60 >60 mL/min   Anion gap 8 5 - 15    Comment: Performed at [facility], [address] [place], [place] [address]  CBC with Differential     Status: Abnormal   Collection Time: 03/25/19 10:55 AM  Result Value Ref Range   WBC 7.8 4.0 - 10.5 K/uL   RBC 4.17 3.87 - 5.11 MIL/uL   Hemoglobin 11.3 (L) 12.0 - 15.0 g/dL   HCT 63.8 (L) 46.6 - 59.9 %   MCV 85.9 80.0 - 100.0 fL   MCH 27.1 26.0 - 34.0 pg   MCHC 31.6 30.0 - 36.0 g/dL   RDW 35.7 (H) 01.7 - 79.3 %   Platelets 303 150 - 400 K/uL   nRBC 0.0 0.0 - 0.2 %   Neutrophils Relative % 67 %   Neutro Abs 5.3 1.7 - 7.7 K/uL   Lymphocytes Relative 22 %   Lymphs Abs 1.7 0.7 - 4.0 K/uL   Monocytes Relative 9 %   Monocytes Absolute 0.7 0.1 - 1.0 K/uL   Eosinophils Relative 1 %   Eosinophils Absolute 0.1 0.0 - 0.5 K/uL   Basophils Relative 1 %  Basophils Absolute 0.0 0.0 - 0.1 K/uL   Immature Granulocytes 0 %   Abs Immature Granulocytes 0.02 0.00 - 0.07 K/uL    Comment: Performed at [facility], [address] [place], [place] [address]  Sedimentation rate     Status: None   Collection Time: 03/25/19 10:55 AM  Result Value Ref Range   Sed Rate 11 0 - 22 mm/hr    Comment: Performed at [facility], [address] [place], [place] [address]  C-reactive protein     Status: None   Collection Time: 03/25/19 10:55 AM  Result Value Ref Range   CRP 0.6 <1.0 mg/dL    Comment: Performed at [facility], [place] [address], [place], [place] [address]  SARS CORONAVIRUS 2 (TAT 6-24 HRS) Nasopharyngeal Nasopharyngeal Swab     Status: None   Collection Time: 03/25/19 10:55 AM   Specimen: Nasopharyngeal Swab  Result Value Ref Range   SARS Coronavirus 2 NEGATIVE NEGATIVE    Comment:  (NOTE) SARS-CoV-2 target nucleic acids are NOT DETECTED. The SARS-CoV-2 RNA is generally detectable in upper and lower respiratory specimens during the acute phase of infection. Negative results do not preclude SARS-CoV-2 infection, do not rule out co-infections with other pathogens, and should not be used as the sole basis for treatment or other patient management decisions. Negative results must be combined with clinical observations, patient history, and epidemiological information. The expected result is Negative. Fact Sheet for Patients: SugarRoll.be Fact Sheet for Healthcare Providers: https://www.woods-mathews.com/ This test is not yet approved or cleared by the [place] FDA and  has been authorized for detection and/or diagnosis of SARS-CoV-2 by FDA under an Emergency Use Authorization (EUA). This EUA will remain  in effect (meaning this test can be used) for the duration of the COVID-19 declaration under Section 56 4(b)(1) of the Act, 21 U.S.C. section 360bbb-3(b)(1), unless the authorization is terminated or revoked sooner. Performed at [facility], [place] [address], [place], [place] [address]   Urinalysis, Routine w reflex microscopic     Status: Abnormal   Collection Time: 03/25/19  6:12 PM  Result Value Ref Range   Color, Urine YELLOW YELLOW   APPearance CLEAR CLEAR   Specific Gravity, Urine 1.027 1.005 - 1.030   pH 6.0 5.0 - 8.0   Glucose, UA NEGATIVE NEGATIVE mg/dL   Hgb urine dipstick LARGE (A) NEGATIVE   Bilirubin Urine NEGATIVE NEGATIVE   Ketones, ur 5 (A) NEGATIVE mg/dL   Protein, ur NEGATIVE NEGATIVE mg/dL   Nitrite NEGATIVE NEGATIVE   Leukocytes,Ua NEGATIVE NEGATIVE   RBC / HPF >50 (H) 0 - 5 RBC/hpf   WBC, UA 0-5 0 - 5 WBC/hpf   Bacteria, UA RARE (A) NONE SEEN   Squamous Epithelial / LPF 0-5 0 - 5   Mucus PRESENT     Comment: Performed at [facility], [address], [place], [place] [address]   Pregnancy, urine     Status: None   Collection Time: 03/25/19  6:12 PM  Result Value Ref Range   Preg Test, Ur NEGATIVE NEGATIVE    Comment:        THE SENSITIVITY OF THIS METHODOLOGY IS >20 mIU/mL. Performed at [facility], [place] [address], [place], [place] [address]    MR Brain W and Wo Contrast  Result Date: 03/25/2019 CLINICAL DATA:  Foot numbness and weakness. Question multiple sclerosis. Symptoms began 5-7 days ago. EXAM: MRI HEAD  WITHOUT AND WITH CONTRAST TECHNIQUE: Multiplanar, multiecho pulse sequences of the brain and surrounding structures were obtained without and with intravenous contrast. CONTRAST:  5 cc Gadavist COMPARISON:  Head CT 02/01/2013 FINDINGS: Brain: Diffusion imaging does not show any acute or subacute infarction or other cause of restricted diffusion. There are multiple foci of abnormal T2 and FLAIR signal with involvement of the upper cervical spinal cord, middle cerebellar peduncles, and extensive involvement throughout the deep and subcortical white matter of both cerebral hemispheres. The lesions are typical of multiple sclerosis lesions. No sign of ischemic infarction. No mass lesion, hemorrhage, hydrocephalus or extra-axial collection. Low level enhancement is seen only along the margins of a white matter lesion in the forceps major area on the left. Vascular: Major vessels at the base of the brain show flow. Skull and upper cervical spine: Negative Sinuses/Orbits: Clear/normal Other: None IMPRESSION: Extensive widespread findings of multiple sclerosis. Multiple white matter lesions of the cerebral hemispheres affecting the deep and subcortical white matter. More subtle lesions affecting the cerebellum in the middle cerebellar peduncle regions and affecting the upper cervical spinal cord as visualized. No lesions show restricted diffusion. Low level contrast enhancement along the margins of a white matter lesion in the left forceps major region. Electronically  Signed   By: Paulina FusiMark  Shogry M.D.   On: 03/25/2019 16:40   DG Chest Portable 1 View  Result Date: 03/25/2019 CLINICAL DATA:  Steroid therapy with concern for pneumonia EXAM: PORTABLE CHEST 1 VIEW COMPARISON:  February 18, 2009 FINDINGS: Lungs are clear. Heart size and pulmonary vascularity are within normal limits. No adenopathy. No bone lesions. IMPRESSION: No edema or consolidation.  No evident adenopathy. Electronically Signed   By: Bretta BangWilliam  Woodruff III M.D.   On: 03/25/2019 18:55   DG Foot Complete Right  Result Date: 03/25/2019 CLINICAL DATA:  Numbness of the right foot over the last week. Unable to move the foot. EXAM: RIGHT FOOT COMPLETE - 3+ VIEW COMPARISON:  None. FINDINGS: There is no evidence of fracture or dislocation. There is no evidence of arthropathy or other focal bone abnormality. Soft tissues are unremarkable. IMPRESSION: Negative. Electronically Signed   By: Paulina FusiMark  Shogry M.D.   On: 03/25/2019 11:01   DG Foot Complete Right  Result Date: 03/24/2019 CLINICAL DATA:  Numbness and heaviness and right foot. EXAM: RIGHT FOOT COMPLETE - 3+ VIEW COMPARISON:  None. FINDINGS: Mild hallux valgus deformity without evidence of an acute fracture, subluxation, or dislocation. No worrisome lytic or sclerotic osseous abnormality. IMPRESSION: Negative. Electronically Signed   By: Kennith CenterEric  Mansell M.D.   On: 03/24/2019 14:47    Pending Labs Unresulted Labs (From admission, onward)    Start     Ordered   03/25/19 1756  Urine culture  ONCE - STAT,   STAT     03/25/19 1758   Signed and Held  HIV Antibody (routine testing w rflx)  (HIV Antibody (Routine testing w reflex) panel)  Once,   R     Signed and Held   Signed and Held  CBC  (enoxaparin (LOVENOX)    CrCl >/= 30 ml/min)  Once,   R    Comments: Baseline for enoxaparin therapy IF NOT ALREADY DRAWN.  Notify MD if PLT < 100 K.    Signed and Held   Signed and Held  Creatinine, serum  (enoxaparin (LOVENOX)    CrCl >/= 30 ml/min)  Once,   R     Comments: Baseline for enoxaparin therapy IF NOT ALREADY DRAWN.  Signed and Held   Signed and Held  Creatinine, serum  (enoxaparin (LOVENOX)    CrCl >/= 30 ml/min)  Weekly,   R    Comments: while on enoxaparin therapy    Signed and Held   Signed and Held  CBC  Tomorrow morning,   R     Signed and Held   Signed and Held  Comprehensive metabolic panel  Tomorrow morning,   R     Signed and Held          Vitals/Pain Today's Vitals   03/25/19 1210 03/25/19 1314 03/25/19 1837 03/25/19 1930  BP: (!) 175/102 (!) 164/102 (!) 160/92   Pulse:  72 63   Resp:  16 18   Temp:  98.6 F (37 C)    TempSrc:  Oral    SpO2:  100% 99%   Weight:      Height:      PainSc: 0-No pain   0-No pain    Isolation Precautions No active isolations  Medications Medications  gadobutrol (GADAVIST) 1 MMOL/ML injection 5 mL (5 mLs Intravenous Contrast Given 03/25/19 1634)    Mobility walks Low fall risk   Focused Assessments Neuro Assessment Handoff:  Swallow screen pass? n/a         Neuro Assessment:   Neuro Checks:      Last Documented NIHSS Modified Score:   Has TPA been given? No If patient is a Neuro Trauma and patient is going to OR before floor call report to 4N Charge nurse: [phone] or [phone]     R Recommendations: See Admitting Provider Note  Report given to:   Additional Notes: Patient is A&Ox 4 from home; Patient has slight swelling to right foot but denies pain however, c/o numbness x 5-7 days; Patient has family hx of MS and MRI scan shows widespread MS for patient; Pt states only hx of Bell's Palsy 8 years ago; Neurology has not seen patient yet. Patient has been informed of new found MS; patient has been given sandwich bag in ED

## 2019-03-25 NOTE — Consult Note (Signed)
NEURO HOSPITALIST CONSULT NOTE   Requestig physician: Dr. Jonelle Sidle  Reason for Consult: Right foot drop  History obtained from:  Patient and Chart     HPI:                                                                                                                                          Alicia Rojas is an 33 y.o. female who presented to OSH with right foot numbness and weakness with foot drop, symptoms starting 7 days ago. The symptoms have persisted since then. She states that she had a first neurological event in 2008 with right facial weakness that was diagnosed as Bell's palsy at that time. A few years later, she had an episode of right upper extremity numbness and weakness diagnosed as carpal tunnel syndrome, that subsequently resolved. Her right foot symptoms constitute her 3rd neurological event of her lifetime. She states that both her mother and aunt were diagnosed with MS in the past.   She was transferred to [facility] to obtain an MRI of her brain. The scan revealed multiple T2-weighted lesions as well as one enhancing lesion, with overall appearance being most consistent with MS.   Past Medical History:  Diagnosis Date  . Chlamydia   . Gestational hypertension   . No pertinent past medical history   . Trichimoniasis     Past Surgical History:  Procedure Laterality Date  . MULTIPLE TOOTH EXTRACTIONS      Family History  Problem Relation Age of Onset  . Hypertension Other               Social History:  reports that she has been smoking. She has never used smokeless tobacco. She reports current alcohol use. She reports that she does not use drugs.  No Known Allergies  HOME MEDICATIONS:                                                                                                                     No home medications listed in Epic.    ROS:  No difficulty with speech, vision changes, facial droop, RUE weakness/numbness or left sided numbness/weakness. No fevers, CP, SOB or urinary incontinence. Other symptoms as per HPI with comprehensive ROS otherwise negative.    Blood pressure (!) 160/92, pulse 63, temperature 98.6 F (37 C), temperature source Oral, resp. rate 18, height 5\' 1"  (1.549 m), weight 59 kg, last menstrual period 03/24/2019, SpO2 99 %.  General Examination:                                                                                                      Physical Exam  HEENT-  [place]/AT  Lungs- Respirations unlabored Extremities- No edema  Neurological Examination Mental Status: Alert, oriented, thought content appropriate.  Speech fluent without evidence of aphasia.  Able to follow all commands without difficulty. Cranial Nerves: II: Visual fields intact with no extinction to DSS. PERRL.   III,IV, VI: No ptosis. EOMI without nystagmus.  V,VII: Smile symmetric, facial temp sensation equal bilaterally VIII: hearing intact to conversation IX,X: Palate rises symmetrically XI: Symmetric shoulder shrug XII: midline tongue extension Motor: RUE 5/5 RLE 5/5 except for 0/5 ankle dorsiflexion, 4/5 ankle plantar flexion, 3/5 ankle inversion and eversion.  LUE and LLE 5/5 Sensory: Temp and light touch intact x 4 including dorsum of feet bilaterally. No extinction to DSS.  Deep Tendon Reflexes: 2+ and symmetric biceps, brachioradialis and patellae.  Plantars: Right: downgoing   Left: downgoing Cerebellar: No ataxia with FNF bilaterally.  Gait: Steppage gait on the right with foot drop.    Lab Results: Basic Metabolic Panel: Recent Labs  Lab 03/25/19 1055  NA 142  K 3.6  CL 108  CO2 26  GLUCOSE 99  BUN 7  CREATININE 0.57  CALCIUM 9.2    CBC: Recent Labs  Lab 03/25/19 1055  WBC 7.8  NEUTROABS 5.3  HGB 11.3*  HCT 35.8*  MCV 85.9  PLT 303    Cardiac Enzymes: No results  for input(s): CKTOTAL, CKMB, CKMBINDEX, TROPONINI in the last 168 hours.  Lipid Panel: No results for input(s): CHOL, TRIG, HDL, CHOLHDL, VLDL, LDLCALC in the last 168 hours.  Imaging: MR Brain W and Wo Contrast  Result Date: 03/25/2019 CLINICAL DATA:  Foot numbness and weakness. Question multiple sclerosis. Symptoms began 5-7 days ago. EXAM: MRI HEAD WITHOUT AND WITH CONTRAST TECHNIQUE: Multiplanar, multiecho pulse sequences of the brain and surrounding structures were obtained without and with intravenous contrast. CONTRAST:  5 cc Gadavist COMPARISON:  Head CT 02/01/2013 FINDINGS: Brain: Diffusion imaging does not show any acute or subacute infarction or other cause of restricted diffusion. There are multiple foci of abnormal T2 and FLAIR signal with involvement of the upper cervical spinal cord, middle cerebellar peduncles, and extensive involvement throughout the deep and subcortical white matter of both cerebral hemispheres. The lesions are typical of multiple sclerosis lesions. No sign of ischemic infarction. No mass lesion, hemorrhage, hydrocephalus or extra-axial collection. Low level enhancement is seen only along the margins of a white matter lesion in the forceps major area on the left. Vascular: Major vessels at the base of the  brain show flow. Skull and upper cervical spine: Negative Sinuses/Orbits: Clear/normal Other: None IMPRESSION: Extensive widespread findings of multiple sclerosis. Multiple white matter lesions of the cerebral hemispheres affecting the deep and subcortical white matter. More subtle lesions affecting the cerebellum in the middle cerebellar peduncle regions and affecting the upper cervical spinal cord as visualized. No lesions show restricted diffusion. Low level contrast enhancement along the margins of a white matter lesion in the left forceps major region. Electronically Signed   By: Paulina Fusi M.D.   On: 03/25/2019 16:40   DG Chest Portable 1 View  Result Date:  03/25/2019 CLINICAL DATA:  Steroid therapy with concern for pneumonia EXAM: PORTABLE CHEST 1 VIEW COMPARISON:  February 18, 2009 FINDINGS: Lungs are clear. Heart size and pulmonary vascularity are within normal limits. No adenopathy. No bone lesions. IMPRESSION: No edema or consolidation.  No evident adenopathy. Electronically Signed   By: Bretta Bang III M.D.   On: 03/25/2019 18:55   DG Foot Complete Right  Result Date: 03/25/2019 CLINICAL DATA:  Numbness of the right foot over the last week. Unable to move the foot. EXAM: RIGHT FOOT COMPLETE - 3+ VIEW COMPARISON:  None. FINDINGS: There is no evidence of fracture or dislocation. There is no evidence of arthropathy or other focal bone abnormality. Soft tissues are unremarkable. IMPRESSION: Negative. Electronically Signed   By: Paulina Fusi M.D.   On: 03/25/2019 11:01   DG Foot Complete Right  Result Date: 03/24/2019 CLINICAL DATA:  Numbness and heaviness and right foot. EXAM: RIGHT FOOT COMPLETE - 3+ VIEW COMPARISON:  None. FINDINGS: Mild hallux valgus deformity without evidence of an acute fracture, subluxation, or dislocation. No worrisome lytic or sclerotic osseous abnormality. IMPRESSION: Negative. Electronically Signed   By: Kennith Center M.D.   On: 03/24/2019 14:47    Assessment:  33 year old female with MS exacerbation. She initially presented with right foot numbness first experienced 7 days ago, which has been constant since.  1. Exam reveals right foot drop with steppage gait and weakness of foot dorsiflexion worse than eversion, inversion and plantar flexion.  2. MRI brain with contrast reveals extensive widespread findings of multiple sclerosis. There are multiple white matter lesions of the cerebral hemispheres affecting the deep and subcortical white matter. More subtle lesions affecting the cerebellum in the middle cerebellar peduncle regions and affecting the upper cervical spinal cord. There is low-level contrast enhancement  along the margins of a white matter lesion in the left forceps major region, consistent with acute demyelination. 3. History of multiple sclerosis in 2 relatives.    Recommendations: 1. MRI of cervical and thoracic spine with and without contrast (ordered).  2. Start IV methylprednisolone 1000 mg qd x 5 days (ordered).  3. Should be started on Copaxone at discharge. Benefits/risks discussed with patient. She would prefer this medication based on risks/benefits. Her insurance starts in January.  4. Outpatient Neurology follow up with [facility].    Electronically signed: Dr. Caryl Pina 03/25/2019, 7:33 PM

## 2019-03-25 NOTE — ED Notes (Signed)
Pt denies claustrophobia  

## 2019-03-25 NOTE — ED Provider Notes (Signed)
[facility] DEPARTMENT Provider Note   CSN: [id number] Arrival date & time: 03/25/19  1018     History Chief Complaint  Patient presents with  . Foot Pain    Alicia Rojas is a 33 y.o. female.  HPI Patient is 33 year old female transferred to [facility], ED for MRI with concern for MS.  Patient presents for right foot numbness that she first experienced 7 days ago has been constant since.  She states that she woke up that morning and got out of bed and fell over because she tripped over her toes since he is unable to move them.  Patient states her entire foot feels numb more so on the lateral aspect. She states foot is slightly swollen as well.  Denies any foot.  Denies any redness.  Endorses some pins and needle sensation as well.  Patient denies any history of numbness or weakness in her feet prior to this event.  Patient does state that approximately 8 to 10 years ago she had an episode of numbness and facial droop that was diagnosed with Bell's palsy.  Not long after that episode she had some right arm weakness and numbness worse in her hand that was diagnosed as carpal tunnel.  She states these symptoms resolved with time denies having any other neurological issues.  Denies any pertinent past medical history.  Does state that her mother has MS.  Patient denies any fevers chills urinary symptoms abdominal pain chest pain shortness of breath.    Past Medical History:  Diagnosis Date  . Chlamydia   . Gestational hypertension   . No pertinent past medical history   . Trichimoniasis     Patient Active Problem List   Diagnosis Date Noted  . Multiple sclerosis ([place]) 03/25/2019  . Normal delivery 01/05/2011  . Threatened preterm labor 12/22/2010    Past Surgical History:  Procedure Laterality Date  . MULTIPLE TOOTH EXTRACTIONS       OB History    Gravida  4   Para  4   Term  4   Preterm  0   AB  0   Living  4     SAB  0   TAB    0   Ectopic  0   Multiple  0   Live Births  4           Family History  Problem Relation Age of Onset  . Hypertension Other     Social History   Tobacco Use  . Smoking status: Current Some Day Smoker  . Smokeless tobacco: Never Used  Substance Use Topics  . Alcohol use: Yes    Comment: occasional  . Drug use: No    Home Medications Prior to Admission medications   Medication Sig Start Date End Date Taking? Authorizing Provider  amLODipine (NORVASC) 10 MG tablet Take 1 tablet (10 mg total) by mouth daily. 02/23/15   Keitha Butte, CNM  amLODipine (NORVASC) 10 MG tablet Take 1 tablet (10 mg total) by mouth daily. 10/14/15   West Pugh, NP  metroNIDAZOLE (FLAGYL) 500 MG tablet Take 1 tablet (500 mg total) by mouth 2 (two) times daily. 12/21/17   Fransico Meadow, PA-C  naproxen (NAPROSYN) 500 MG tablet Take 1 tablet (500 mg total) by mouth 2 (two) times daily. 12/28/15   Carlisle Cater, PA-C    Allergies    Patient has no known allergies.  Review of Systems   Review of  Systems  Constitutional: Negative for chills and fever.  HENT: Negative for congestion.   Eyes: Negative for pain.  Respiratory: Negative for cough and shortness of breath.   Cardiovascular: Negative for chest pain and leg swelling.  Gastrointestinal: Negative for abdominal pain and vomiting.  Genitourinary: Negative for dysuria.  Musculoskeletal: Negative for myalgias.  Skin: Negative for rash.  Neurological: Positive for weakness and numbness. Negative for dizziness and headaches.    Physical Exam Updated Vital Signs BP (!) 160/92 (BP Location: Right Arm)   Pulse 63   Temp 98.6 F (37 C) (Oral)   Resp 18   Ht 5' 1"  (1.549 m)   Wt 59 kg   LMP 03/24/2019   SpO2 99%   BMI 24.56 kg/m   Physical Exam Vitals and nursing note reviewed.  Constitutional:      General: She is not in acute distress.    Appearance: Normal appearance. She is not ill-appearing.  HENT:     Head:  Normocephalic and atraumatic.  Eyes:     General: No scleral icterus.       Right eye: No discharge.        Left eye: No discharge.     Conjunctiva/sclera: Conjunctivae normal.  Pulmonary:     Effort: Pulmonary effort is normal.     Breath sounds: No stridor.  Musculoskeletal:     Comments: Right lower extremity with weak dorsiflexion plantarflexion of ankle.  Intact 5/5 strength of flexion-extension of knee.  Able to flex and extend the hips 5/5 strength  No tenderness to palpation of right foot.  There is mild swelling over the dorsum of the foot  Neurological:     Mental Status: She is alert and oriented to person, place, and time. Mental status is at baseline.     Comments: Alert and oriented to self, place, time and event.   Speech is fluent, clear without dysarthria or dysphasia.   Sensation decreased in right foot more so in lateral RT foot. Sensation intact in upper extremities   Gait is hesitant with right food drop. Negative Romberg. No pronator drift.   CN I not tested  CN II grossly intact visual fields bilaterally. Did not visualize posterior eye.   CN III, IV, VI PERRLA and EOMs intact bilaterally  CN V Intact sensation to sharp and light touch to the face  CN VII facial movements symmetric  CN VIII not tested  CN IX, X no uvula deviation, symmetric rise of soft palate  CN XI 5/5 SCM and trapezius strength bilaterally  CN XII Midline tongue protrusion, symmetric L/R movements      ED Results / Procedures / Treatments   Labs (all labs ordered are listed, but only abnormal results are displayed) Labs Reviewed  CBC WITH DIFFERENTIAL/PLATELET - Abnormal; Notable for the following components:      Result Value   Hemoglobin 11.3 (*)    HCT 35.8 (*)    RDW 17.9 (*)    All other components within normal limits  URINALYSIS, ROUTINE W REFLEX MICROSCOPIC - Abnormal; Notable for the following components:   Hgb urine dipstick LARGE (*)    Ketones, ur 5 (*)    RBC /  HPF >50 (*)    Bacteria, UA RARE (*)    All other components within normal limits  SARS CORONAVIRUS 2 (TAT 6-24 HRS)  URINE CULTURE  BASIC METABOLIC PANEL  SEDIMENTATION RATE  C-REACTIVE PROTEIN  PREGNANCY, URINE    EKG None  Radiology MR  Brain W and Wo Contrast  Result Date: 03/25/2019 CLINICAL DATA:  Foot numbness and weakness. Question multiple sclerosis. Symptoms began 5-7 days ago. EXAM: MRI HEAD WITHOUT AND WITH CONTRAST TECHNIQUE: Multiplanar, multiecho pulse sequences of the brain and surrounding structures were obtained without and with intravenous contrast. CONTRAST:  5 cc Gadavist COMPARISON:  Head CT 02/01/2013 FINDINGS: Brain: Diffusion imaging does not show any acute or subacute infarction or other cause of restricted diffusion. There are multiple foci of abnormal T2 and FLAIR signal with involvement of the upper cervical spinal cord, middle cerebellar peduncles, and extensive involvement throughout the deep and subcortical white matter of both cerebral hemispheres. The lesions are typical of multiple sclerosis lesions. No sign of ischemic infarction. No mass lesion, hemorrhage, hydrocephalus or extra-axial collection. Low level enhancement is seen only along the margins of a white matter lesion in the forceps major area on the left. Vascular: Major vessels at the base of the brain show flow. Skull and upper cervical spine: Negative Sinuses/Orbits: Clear/normal Other: None IMPRESSION: Extensive widespread findings of multiple sclerosis. Multiple white matter lesions of the cerebral hemispheres affecting the deep and subcortical white matter. More subtle lesions affecting the cerebellum in the middle cerebellar peduncle regions and affecting the upper cervical spinal cord as visualized. No lesions show restricted diffusion. Low level contrast enhancement along the margins of a white matter lesion in the left forceps major region. Electronically Signed   By: Nelson Chimes M.D.   On:  03/25/2019 16:40   DG Chest Portable 1 View  Result Date: 03/25/2019 CLINICAL DATA:  Steroid therapy with concern for pneumonia EXAM: PORTABLE CHEST 1 VIEW COMPARISON:  February 18, 2009 FINDINGS: Lungs are clear. Heart size and pulmonary vascularity are within normal limits. No adenopathy. No bone lesions. IMPRESSION: No edema or consolidation.  No evident adenopathy. Electronically Signed   By: Lowella Grip III M.D.   On: 03/25/2019 18:55   DG Foot Complete Right  Result Date: 03/25/2019 CLINICAL DATA:  Numbness of the right foot over the last week. Unable to move the foot. EXAM: RIGHT FOOT COMPLETE - 3+ VIEW COMPARISON:  None. FINDINGS: There is no evidence of fracture or dislocation. There is no evidence of arthropathy or other focal bone abnormality. Soft tissues are unremarkable. IMPRESSION: Negative. Electronically Signed   By: Nelson Chimes M.D.   On: 03/25/2019 11:01   DG Foot Complete Right  Result Date: 03/24/2019 CLINICAL DATA:  Numbness and heaviness and right foot. EXAM: RIGHT FOOT COMPLETE - 3+ VIEW COMPARISON:  None. FINDINGS: Mild hallux valgus deformity without evidence of an acute fracture, subluxation, or dislocation. No worrisome lytic or sclerotic osseous abnormality. IMPRESSION: Negative. Electronically Signed   By: Misty Stanley M.D.   On: 03/24/2019 14:47    Procedures Procedures (including critical care time)  Medications Ordered in ED Medications  gadobutrol (GADAVIST) 1 MMOL/ML injection 5 mL (5 mLs Intravenous Contrast Given 03/25/19 1634)    ED Course  I have reviewed the triage vital signs and the nursing notes.  Pertinent labs & imaging results that were available during my care of the patient were reviewed by me and considered in my medical decision making (see chart for details).  Clinical Course as of Mar 24 1916  Mon Mar 25, 2019  1647 DG Foot Complete Right [WF]  1647 Extensive widespread findings of multiple sclerosis. Multiple white matter  lesions of the cerebral hemispheres affecting the deep and subcortical white matter. More subtle lesions affecting the cerebellum in  the middle cerebellar peduncle regions and affecting the upper cervical spinal cord as visualized. No lesions show restricted diffusion. Low level contrast enhancement along the margins of a white matter lesion in the left forceps major region.    MR Brain W and Wo Contrast [WF]  1916 No leukocytosis.  CBC with Differential(!) [WF]  1916 Negative for electrolyte abnormalities  Basic metabolic panel [WF]  1783 CRP ESR negative.   [WF]    Clinical Course User Index [WF] Tedd Sias, PA   MDM Rules/Calculators/A&P                      Patient is 33 year old female transferred to [facility], ED for MRI with concern for MS.  Patient presents for right foot numbness that she first experienced 7 days ago has been constant since.   MRI w/ contrast shows widespread MS with white matter lesions in the cerebral hemispheres affecting the deep and subcortical white matter. Other lesions present.   Consult placed to neurology to discuss findings with Dr. Rory Percy.  Discussed with Dr. Rory Percy who recommends CXR and UA to screen before starting steroids. Patient will be evaluated by neurology and hospitalized.   Chest x-ray with no evidence of infection.  Urine pregnancy negative.  Urinalysis without evidence of infection culture pending.  6:40pm discussed with Dr. Jonelle Sidle who will assess patient and bring her into the hospital.  Neurology to follow.   Final Clinical Impression(s) / ED Diagnoses Final diagnoses:  Multiple sclerosis [facility])    Rx / DC Orders ED Discharge Orders    None       Tedd Sias, [place] 03/25/19 1918    Pattricia Boss, MD 03/26/19 1409

## 2019-03-25 NOTE — ED Notes (Signed)
ED Provider at bedside. 

## 2019-03-25 NOTE — ED Triage Notes (Signed)
Pt reports right foot numbness and inability to move it x 1 week. Ambulatory. Left AMA from [facility] yesterday. Denies injury. Slight swelling in foot.

## 2019-03-25 NOTE — ED Notes (Signed)
Pt for transfer to [facility] ER for MRI, accepted by Dr Sherry Ruffing, pt aware no stops enroute to [facility].

## 2019-03-25 NOTE — ED Notes (Signed)
Report provided to [facility] charge RN of pts transfer to  ER

## 2019-03-25 NOTE — ED Notes (Signed)
Pt. Transported to MRI 

## 2019-03-25 NOTE — H&P (Signed)
History and Physical   [name] XKG:[id number] DOB: 03-Jan-1986 DOA: 03/25/2019  Referring MD/NP/PA: Dr. Clarice Pole  PCP: Patient, No Pcp Per   Outpatient Specialists: None  Patient coming from: Home  Chief Complaint: Numbness of the right foot  HPI: Alicia Rojas is a 33 y.o. female with medical history significant of no significant past medical history except for PID who presented to the ER with 7 days of right foot weakness and numbness.  Patient started experiencing the symptoms out of nowhere.  She got out of bed and felt because she was unable to move her foot and toes.  The anterior foot is gradually getting numb especially on the lateral side.  She noted more swelling.  She has been dragging her feet from time to time.  Sensation is more like pins-and-needles.  No prior history like that.  Her only previous neurologic event was about 8 years ago when she had Bell's palsy.  Her mother apparently is died of some neurologic diseases including MS.  Patient was seen in the ER and evaluated.  Work-up with MRI and now shows evidence of multiple sclerosis.  Neurology evaluated patient and recommended admission and initiation of treatment.  She denied any other focal weakness.  No visual changes..  ED Course: Temperature 99.4 blood pressure 184/98 pulse 88 respiratory 18 oxygen sats 97% room air.  CBC showed hemoglobin 11.3 otherwise all within normal chemistry also appears to be within normal.  Urinalysis is negative.  Chest x-ray showed no acute findings x-ray of the foot showed no significant findings MRI of the brain showed extensive widespread findings of multiple sclerosis.  Neurology consulted and patient is being admitted for treatment.  Review of Systems: As per HPI otherwise 10 point review of systems negative.    Past Medical History:  Diagnosis Date  . Chlamydia   . Gestational hypertension   . No pertinent past medical history   . Trichimoniasis     Past Surgical  History:  Procedure Laterality Date  . MULTIPLE TOOTH EXTRACTIONS       reports that she has been smoking. She has never used smokeless tobacco. She reports current alcohol use. She reports that she does not use drugs.  No Known Allergies  Family History  Problem Relation Age of Onset  . Hypertension Other      Prior to Admission medications   Medication Sig Start Date End Date Taking? Authorizing Provider  amLODipine (NORVASC) 10 MG tablet Take 1 tablet (10 mg total) by mouth daily. 02/23/15   Montez Morita, CNM  amLODipine (NORVASC) 10 MG tablet Take 1 tablet (10 mg total) by mouth daily. 10/14/15   Jean Rosenthal, NP  metroNIDAZOLE (FLAGYL) 500 MG tablet Take 1 tablet (500 mg total) by mouth 2 (two) times daily. 12/21/17   Elson Areas, PA-C  naproxen (NAPROSYN) 500 MG tablet Take 1 tablet (500 mg total) by mouth 2 (two) times daily. 12/28/15   Renne Crigler, PA-C    Physical Exam: Vitals:   03/25/19 1314 03/25/19 1837 03/25/19 2107 03/25/19 2301  BP: (!) 164/102 (!) 160/92 (!) 180/96 (!) 170/106  Pulse: 72 63 65 75  Resp: 16 18 15    Temp: 98.6 F (37 C)  98.4 F (36.9 C)   TempSrc: Oral  Oral   SpO2: 100% 99% 97%   Weight:      Height:          Constitutional: Very anxious but no distress Vitals:   03/25/19  1314 03/25/19 1837 03/25/19 2107 03/25/19 2301  BP: (!) 164/102 (!) 160/92 (!) 180/96 (!) 170/106  Pulse: 72 63 65 75  Resp: 16 18 15    Temp: 98.6 F (37 C)  98.4 F (36.9 C)   TempSrc: Oral  Oral   SpO2: 100% 99% 97%   Weight:      Height:       Eyes: PERRL, lids and conjunctivae normal ENMT: Mucous membranes are moist. Posterior pharynx clear of any exudate or lesions.Normal dentition.  Neck: normal, supple, no masses, no thyromegaly Respiratory: clear to auscultation bilaterally, no wheezing, no crackles. Normal respiratory effort. No accessory muscle use.  Cardiovascular: Regular rate and rhythm, no murmurs / rubs / gallops. No extremity  edema. 2+ pedal pulses. No carotid bruits.  Abdomen: no tenderness, no masses palpated. No hepatosplenomegaly. Bowel sounds positive.  Musculoskeletal: no clubbing / cyanosis. No joint deformity upper and lower extremities. Good ROM, no contractures. Normal muscle tone.  Skin: no rashes, lesions, ulcers. No induration Neurologic: CN 2-12 grossly intact. Sensation decreased in the right lower extremity, DTR decreased, power is 4 out of 5 in the right lower extremity and 5 out of 5 in all other extremities, Psychiatric: Normal judgment and insight. Alert and oriented x 3. Normal mood.     Labs on Admission: I have personally reviewed following labs and imaging studies  CBC: Recent Labs  Lab 03/25/19 1055  WBC 7.8  NEUTROABS 5.3  HGB 11.3*  HCT 35.8*  MCV 85.9  PLT 229   Basic Metabolic Panel: Recent Labs  Lab 03/25/19 1055  NA 142  K 3.6  CL 108  CO2 26  GLUCOSE 99  BUN 7  CREATININE 0.57  CALCIUM 9.2   GFR: Estimated Creatinine Clearance: 81.8 mL/min (by C-G formula based on SCr of 0.57 mg/dL). Liver Function Tests: No results for input(s): AST, ALT, ALKPHOS, BILITOT, PROT, ALBUMIN in the last 168 hours. No results for input(s): LIPASE, AMYLASE in the last 168 hours. No results for input(s): AMMONIA in the last 168 hours. Coagulation Profile: No results for input(s): INR, PROTIME in the last 168 hours. Cardiac Enzymes: No results for input(s): CKTOTAL, CKMB, CKMBINDEX, TROPONINI in the last 168 hours. BNP (last 3 results) No results for input(s): PROBNP in the last 8760 hours. HbA1C: No results for input(s): HGBA1C in the last 72 hours. CBG: No results for input(s): GLUCAP in the last 168 hours. Lipid Profile: No results for input(s): CHOL, HDL, LDLCALC, TRIG, CHOLHDL, LDLDIRECT in the last 72 hours. Thyroid Function Tests: No results for input(s): TSH, T4TOTAL, FREET4, T3FREE, THYROIDAB in the last 72 hours. Anemia Panel: No results for input(s): VITAMINB12,  FOLATE, FERRITIN, TIBC, IRON, RETICCTPCT in the last 72 hours. Urine analysis:    Component Value Date/Time   COLORURINE YELLOW 03/25/2019 1812   APPEARANCEUR CLEAR 03/25/2019 1812   LABSPEC 1.027 03/25/2019 1812   PHURINE 6.0 03/25/2019 1812   GLUCOSEU NEGATIVE 03/25/2019 1812   HGBUR LARGE (A) 03/25/2019 1812   BILIRUBINUR NEGATIVE 03/25/2019 1812   KETONESUR 5 (A) 03/25/2019 1812   PROTEINUR NEGATIVE 03/25/2019 1812   UROBILINOGEN 1.0 11/13/2013 1612   NITRITE NEGATIVE 03/25/2019 1812   LEUKOCYTESUR NEGATIVE 03/25/2019 1812   Sepsis Labs: @LABRCNTIP (procalcitonin:4,lacticidven:4) ) Recent Results (from the past 240 hour(s))  SARS CORONAVIRUS 2 (TAT 6-24 HRS) Nasopharyngeal Nasopharyngeal Swab     Status: None   Collection Time: 03/25/19 10:55 AM   Specimen: Nasopharyngeal Swab  Result Value Ref Range Status   SARS  Coronavirus 2 NEGATIVE NEGATIVE Final    Comment: (NOTE) SARS-CoV-2 target nucleic acids are NOT DETECTED. The SARS-CoV-2 RNA is generally detectable in upper and lower respiratory specimens during the acute phase of infection. Negative results do not preclude SARS-CoV-2 infection, do not rule out co-infections with other pathogens, and should not be used as the sole basis for treatment or other patient management decisions. Negative results must be combined with clinical observations, patient history, and epidemiological information. The expected result is Negative. Fact Sheet for Patients: HairSlick.nohttps://www.fda.gov/media/138098/download Fact Sheet for Healthcare Providers: quierodirigir.comhttps://www.fda.gov/media/138095/download This test is not yet approved or cleared by the [place] FDA and  has been authorized for detection and/or diagnosis of SARS-CoV-2 by FDA under an Emergency Use Authorization (EUA). This EUA will remain  in effect (meaning this test can be used) for the duration of the COVID-19 declaration under Section 56 4(b)(1) of the Act, 21 U.S.C. section  360bbb-3(b)(1), unless the authorization is terminated or revoked sooner. Performed at [facility], [address] [place], [place] [address]      Radiological Exams on Admission: MR Brain W and Wo Contrast  Result Date: 03/25/2019 CLINICAL DATA:  Foot numbness and weakness. Question multiple sclerosis. Symptoms began 5-7 days ago. EXAM: MRI HEAD WITHOUT AND WITH CONTRAST TECHNIQUE: Multiplanar, multiecho pulse sequences of the brain and surrounding structures were obtained without and with intravenous contrast. CONTRAST:  5 cc Gadavist COMPARISON:  Head CT 02/01/2013 FINDINGS: Brain: Diffusion imaging does not show any acute or subacute infarction or other cause of restricted diffusion. There are multiple foci of abnormal T2 and FLAIR signal with involvement of the upper cervical spinal cord, middle cerebellar peduncles, and extensive involvement throughout the deep and subcortical white matter of both cerebral hemispheres. The lesions are typical of multiple sclerosis lesions. No sign of ischemic infarction. No mass lesion, hemorrhage, hydrocephalus or extra-axial collection. Low level enhancement is seen only along the margins of a white matter lesion in the forceps major area on the left. Vascular: Major vessels at the base of the brain show flow. Skull and upper cervical spine: Negative Sinuses/Orbits: Clear/normal Other: None IMPRESSION: Extensive widespread findings of multiple sclerosis. Multiple white matter lesions of the cerebral hemispheres affecting the deep and subcortical white matter. More subtle lesions affecting the cerebellum in the middle cerebellar peduncle regions and affecting the upper cervical spinal cord as visualized. No lesions show restricted diffusion. Low level contrast enhancement along the margins of a white matter lesion in the left forceps major region. Electronically Signed   By: Paulina FusiMark  Shogry M.D.   On: 03/25/2019 16:40   DG Chest Portable 1 View  Result  Date: 03/25/2019 CLINICAL DATA:  Steroid therapy with concern for pneumonia EXAM: PORTABLE CHEST 1 VIEW COMPARISON:  February 18, 2009 FINDINGS: Lungs are clear. Heart size and pulmonary vascularity are within normal limits. No adenopathy. No bone lesions. IMPRESSION: No edema or consolidation.  No evident adenopathy. Electronically Signed   By: Bretta BangWilliam  Woodruff III M.D.   On: 03/25/2019 18:55   DG Foot Complete Right  Result Date: 03/25/2019 CLINICAL DATA:  Numbness of the right foot over the last week. Unable to move the foot. EXAM: RIGHT FOOT COMPLETE - 3+ VIEW COMPARISON:  None. FINDINGS: There is no evidence of fracture or dislocation. There is no evidence of arthropathy or other focal bone abnormality. Soft tissues are unremarkable. IMPRESSION: Negative. Electronically Signed   By: Paulina FusiMark  Shogry M.D.   On: 03/25/2019 11:01   DG Foot  Complete Right  Result Date: 03/24/2019 CLINICAL DATA:  Numbness and heaviness and right foot. EXAM: RIGHT FOOT COMPLETE - 3+ VIEW COMPARISON:  None. FINDINGS: Mild hallux valgus deformity without evidence of an acute fracture, subluxation, or dislocation. No worrisome lytic or sclerotic osseous abnormality. IMPRESSION: Negative. Electronically Signed   By: Kennith CenterEric  Mansell M.D.   On: 03/24/2019 14:47      Assessment/Plan Principal Problem:   Multiple sclerosis (HCC) Active Problems:   Elevated blood pressure reading without diagnosis of hypertension     #1 multiple sclerosis: New diagnosis.  Patient will be admitted and be followed and evaluated by neurology.  Initiate IV steroids as recommended.  Counseling to be provided.  Any further care plan by neurology.  #2 elevated blood pressure: No diagnosis of hypertension.  Will follow closely and initiate treatment if warranted.   DVT prophylaxis: Lovenox Code Status: Full code Family Communication: No family at bedside Disposition Plan: To be determined Consults called: Dr. Otelia LimesLindzen of  neurology Admission status: Inpatient  Severity of Illness: The appropriate patient status for this patient is INPATIENT. Inpatient status is judged to be reasonable and necessary in order to provide the required intensity of service to ensure the patient's safety. The patient's presenting symptoms, physical exam findings, and initial radiographic and laboratory data in the context of their chronic comorbidities is felt to place them at high risk for further clinical deterioration. Furthermore, it is not anticipated that the patient will be medically stable for discharge from the hospital within 2 midnights of admission. The following factors support the patient status of inpatient.   " The patient's presenting symptoms include right-sided weakness and numbness. " The worrisome physical exam findings include mild weakness of the right foot. " The initial radiographic and laboratory data are worrisome because of evidence of extensive multiple sclerosis on MRI. " The chronic co-morbidities include no significant past medical history.   * I certify that at the point of admission it is my clinical judgment that the patient will require inpatient hospital care spanning beyond 2 midnights from the point of admission due to high intensity of service, high risk for further deterioration and high frequency of surveillance required.Lonia Blood*    Mio Schellinger,LAWAL MD [facility] Pager [phone]  If 7PM-7AM, please contact night-coverage [email or web address] Password [email or web address]  03/25/2019, 11:13 PM

## 2019-03-25 NOTE — ED Notes (Signed)
Admitting MD at bedside prior to patient transport to floor-Monique,RN

## 2019-03-25 NOTE — Progress Notes (Signed)
MD paged about patients elevated BP. Patient is asymptomatic. Did express that she has been worried about situation & did say that her mom died on 04-17-2022 a few years back from [place] and it has been on her mind. Spoke with on call MD and at the moment no interventions at this time. Will continue to monitor.

## 2019-03-26 ENCOUNTER — Inpatient Hospital Stay (HOSPITAL_COMMUNITY): Payer: Medicaid Other

## 2019-03-26 LAB — COMPREHENSIVE METABOLIC PANEL
ALT: 18 U/L (ref 0–44)
AST: 18 U/L (ref 15–41)
Albumin: 3.7 g/dL (ref 3.5–5.0)
Alkaline Phosphatase: 46 U/L (ref 38–126)
Anion gap: 8 (ref 5–15)
BUN: 7 mg/dL (ref 6–20)
CO2: 25 mmol/L (ref 22–32)
Calcium: 8.9 mg/dL (ref 8.9–10.3)
Chloride: 106 mmol/L (ref 98–111)
Creatinine, Ser: 0.59 mg/dL (ref 0.44–1.00)
GFR calc Af Amer: >60 mL/min (ref >60)
GFR calc non Af Amer: >60 mL/min (ref >60)
Glucose, Bld: 119 mg/dL — ABNORMAL HIGH (ref 70–99)
Potassium: 3.7 mmol/L (ref 3.5–5.1)
Sodium: 139 mmol/L (ref 135–145)
Total Bilirubin: 0.4 mg/dL (ref 0.3–1.2)
Total Protein: 6.5 g/dL (ref 6.5–8.1)

## 2019-03-26 LAB — CBC
HCT: 35.5 % — ABNORMAL LOW (ref 36.0–46.0)
Hemoglobin: 11.5 g/dL — ABNORMAL LOW (ref 12.0–15.0)
MCH: 27.4 pg (ref 26.0–34.0)
MCHC: 32.4 g/dL (ref 30.0–36.0)
MCV: 84.5 fL (ref 80.0–100.0)
Platelets: 321 10*3/uL (ref 150–400)
RBC: 4.2 MIL/uL (ref 3.87–5.11)
RDW: 17.4 % — ABNORMAL HIGH (ref 11.5–15.5)
WBC: 8 10*3/uL (ref 4.0–10.5)
nRBC: 0 % (ref 0.0–0.2)

## 2019-03-26 LAB — URINE CULTURE: Culture: <10000 — AB

## 2019-03-26 LAB — VITAMIN B12: Vitamin B-12: 296 pg/mL (ref 180–914)

## 2019-03-26 LAB — HIV ANTIBODY (ROUTINE TESTING W REFLEX): HIV Screen 4th Generation wRfx: NONREACTIVE

## 2019-03-26 IMAGING — MR MR CERVICAL SPINE WO/W CM
10 of 21 series · 18 of 48 positions shown · IV contrast (6 ML GADAVIST)
Comparison: MRI brain done yesterday.

CLINICAL DATA: Seven day history of right foot weakness and
numbness. Brain findings consistent with multiple sclerosis.

EXAM:
MRI CERVICAL SPINE WITHOUT AND WITH CONTRAST
TECHNIQUE: Multiplanar and multiecho pulse sequences of the cervical spine, to
include the craniocervical junction and cervicothoracic junction,
were obtained without and with intravenous contrast.
CONTRAST:  6mL GADAVIST GADOBUTROL 1 MMOL/ML IV SOLN

[Series 2: T2 · sagittal · 3.0mm · 0.35mm/px · 2 of 16 slices shown (1 of 6)]
[im 1/16]
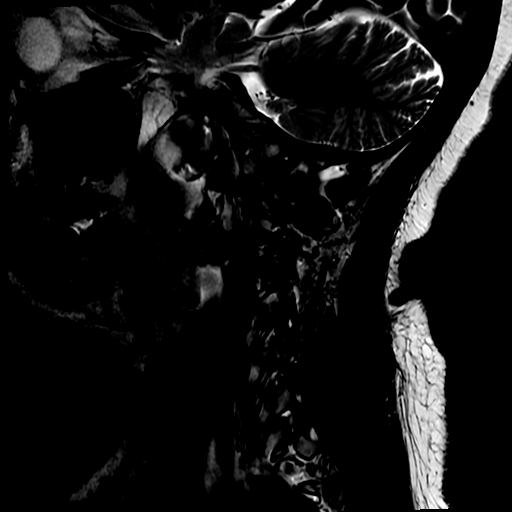
[im 16/16]
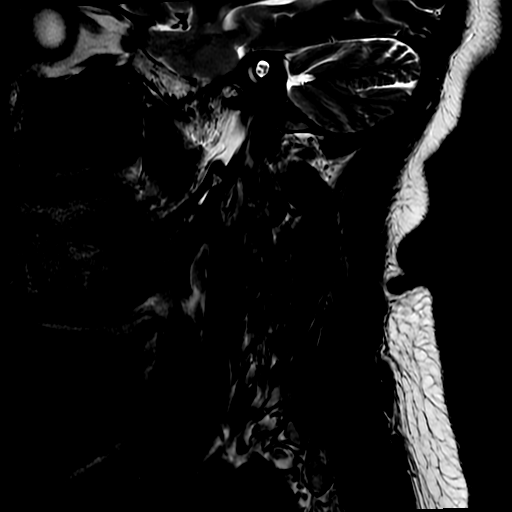

[Series 6: T2 · axial · 3.0mm · 0.35mm/px · z∈[-87,+5]mm · 3 of 30 slices shown (2 of 6)]
[im 1/30]
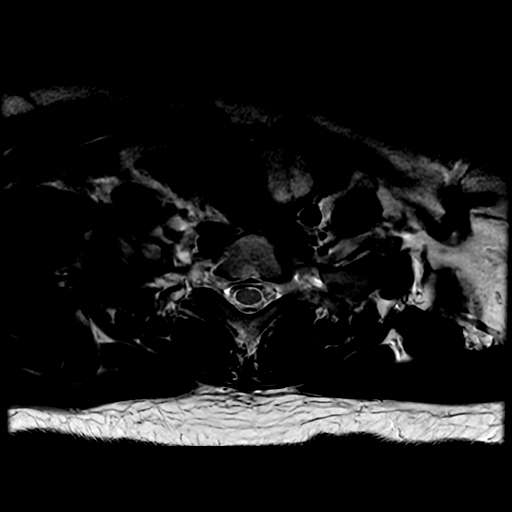
[im 15/30]
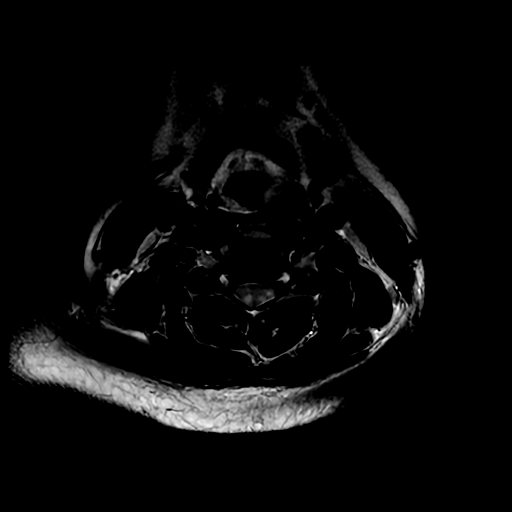
[im 30/30]
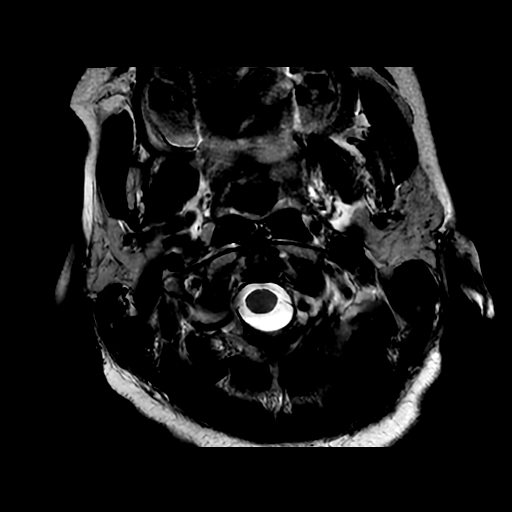

[Series 7: T1 · axial · non-contrast · 3.0mm · 0.35mm/px · z∈[-87,+5]mm · 2 of 29 slices shown (1 of 4)]
[im 1/29]
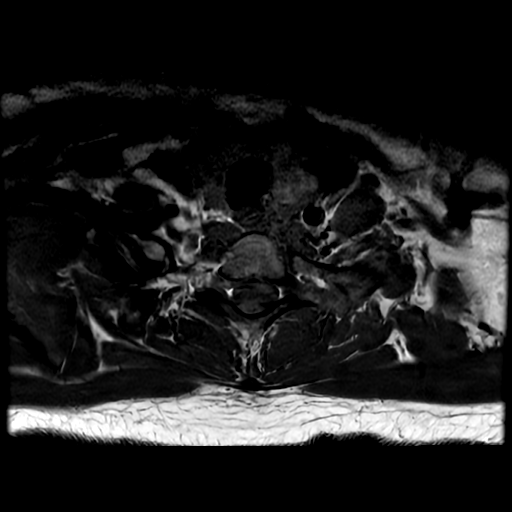
[im 29/29]
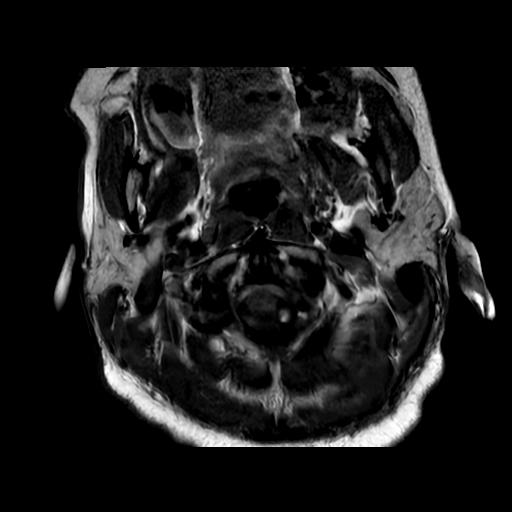

[Series 10: T2 · sagittal · 3.0mm · 0.51mm/px · 1 of 20 slices shown (3 of 6)]
[im 1/20]
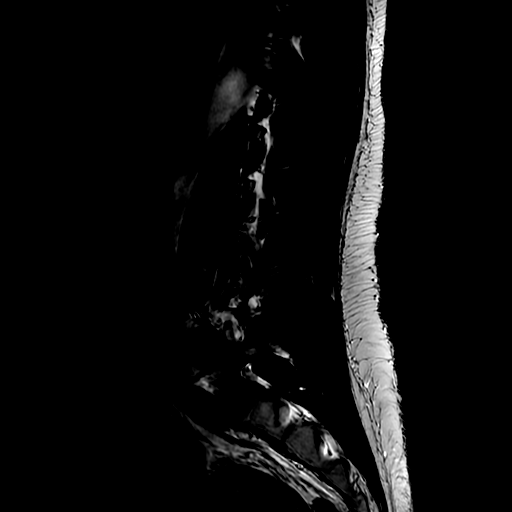

[Series 14: T1 · sagittal · 3.0mm · 0.51mm/px · 1 of 20 slices shown (2 of 4)]
[im 1/20]
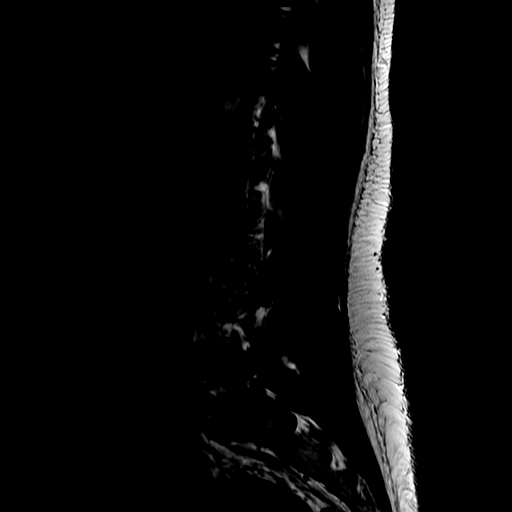

[Series 15: T2 · axial · 4.0mm · 0.39mm/px · 1 of 20 slices shown (4 of 6)]
[im 1/20]
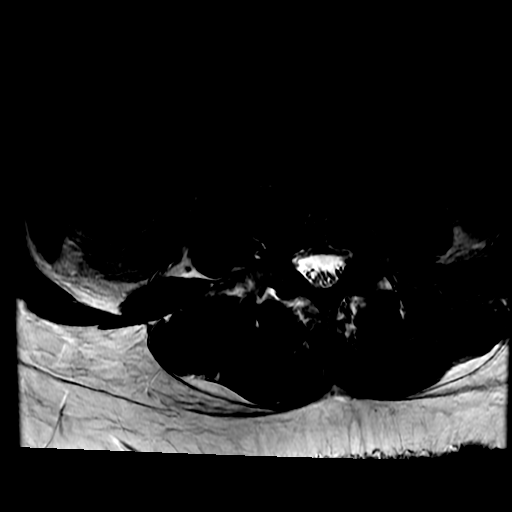

[Series 17: T2 · axial · 4.0mm · 0.39mm/px · z∈[-475,-344]mm · 2 of 26 slices shown (5 of 6)]
[im 1/26]
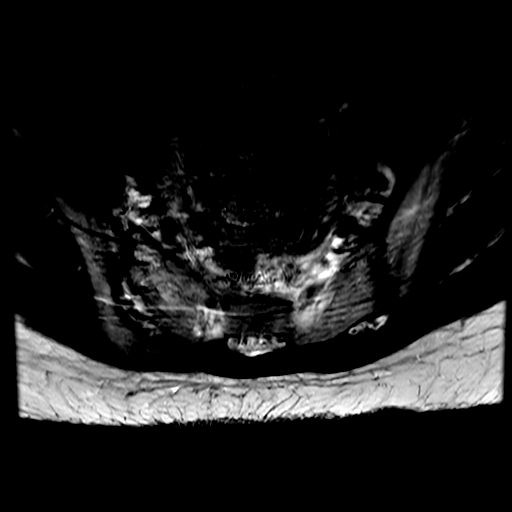
[im 26/26]
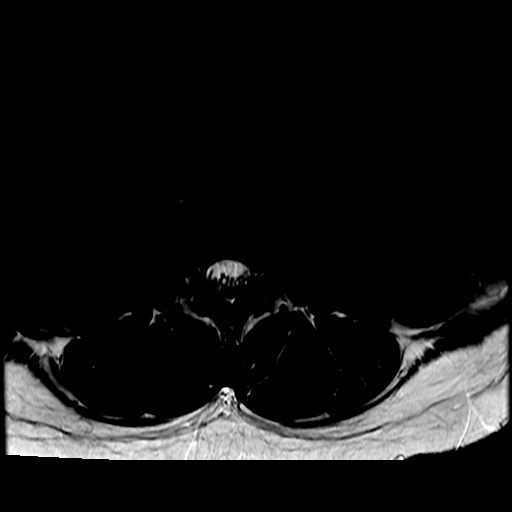

[Series 18: T1 · sagittal · 3.0mm · 0.94mm/px · 1 of 14 slices shown (3 of 4)]
[im 1/14]
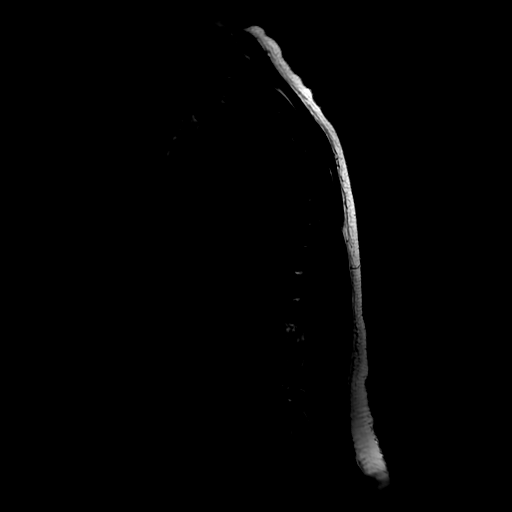

[Series 19: T2 · axial · 4.0mm · 0.39mm/px · z∈[-475,-344]mm · 2 of 26 slices shown (6 of 6)]
[im 1/26]
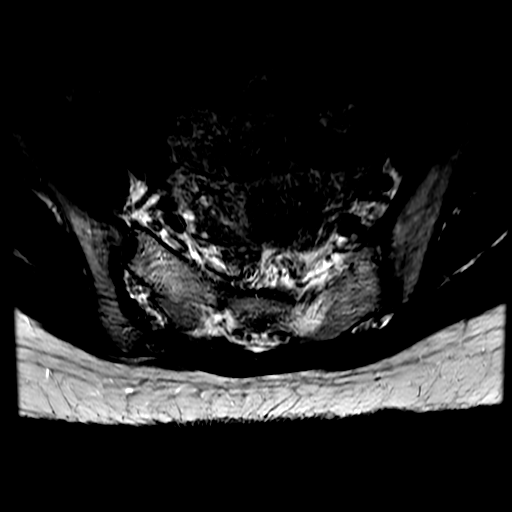
[im 26/26]
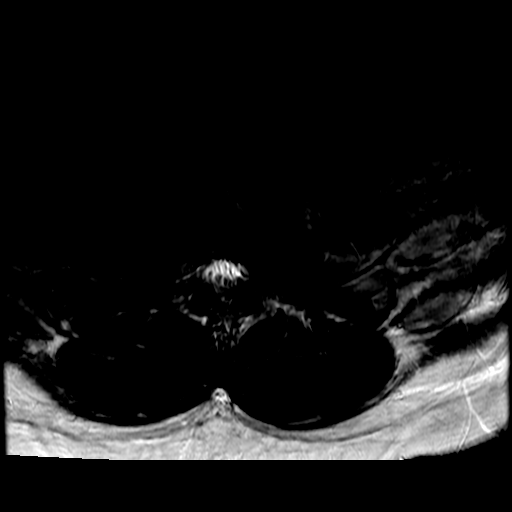

[Series 22: T1 · axial · non-contrast · 3.0mm · 0.39mm/px · z∈[-419,-296]mm · 3 of 55 slices shown (4 of 4)]
[im 1/55]
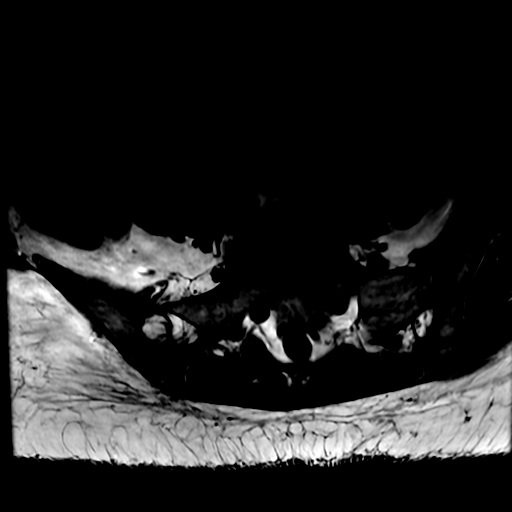
[im 19/55]
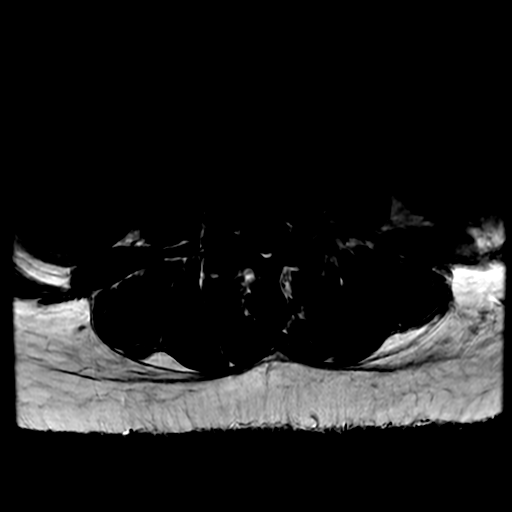
[im 37/55]
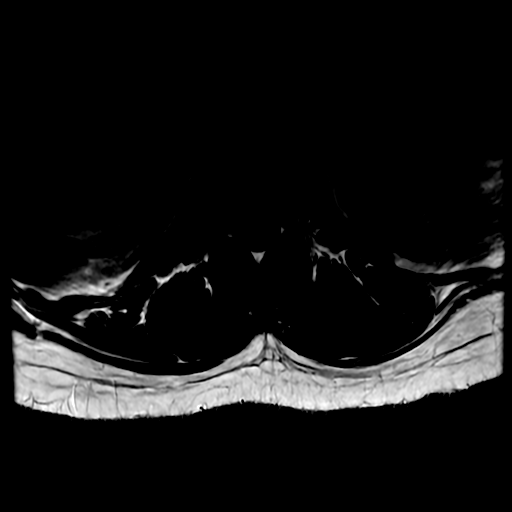

[18 of 48 positions shown; findings below may reference images not displayed]

FINDINGS: Alignment: Straightening of the normal cervical lordosis.

Vertebrae: Discogenic endplate marrow changes at C4-5 which could be
associated with neck pain.

Cord: Widespread demyelinating disease throughout the cervical
region from the cervicomedullary junction to the T1 level. Multiple
discrete and indistinct MS plaques throughout the region. Cord
swelling particularly in the region from C3 through C6. After
contrast administration, there are areas of abnormal enhancement,
most pronounced at the ventral right cord at the C4 level.

Posterior Fossa, vertebral arteries, paraspinal tissues: No soft
tissue neck lesion seen. See results of brain MRI.

Disc levels:

C3-4: Disc bulge. Mild bilateral foraminal narrowing without
apparent neural compression.

C4-5: Spondylosis with endplate osteophytes and protruding disc
material. Spinal stenosis with AP diameter in the midline as narrow
as 8.9 mm. Effacement of the subarachnoid space. Bilateral foraminal
stenosis could affect either C5 nerve.

C5-6: Spondylosis with endplate osteophytes and protrusion of the
disc more prominent towards the right. Spinal stenosis with AP
diameter in the midline as narrow as 8.4 mm. Right foraminal
stenosis likely to compress the right C6 nerve.

C6-7: Spondylosis with endplate osteophytes and shallow protrusion
of the disc. Spinal stenosis with AP diameter in the midline as
narrow as 8.7 mm. Mild bilateral foraminal narrowing that could
possibly affect either C7 nerve.

C7-T1: Facet osteoarthritis. Mild bulging of the disc. No
compressive stenosis.
IMPRESSION: Advanced demyelinating disease in the region affecting from the
cervicomedullary junction to the level of T1. Involvement most
pronounced in the midcervical region with cord swelling. Areas of
abnormal contrast enhancement, most notable in the ventral cord on
the right at C4.

Degenerative spondylosis also present, with spinal stenosis from
C4-5 through C6-7, exacerbated by the cord swelling. Foraminal
narrowing that could cause neural compression bilaterally at C4-5,
on the right at C5-6 and bilateral at C6-7.

## 2019-03-26 IMAGING — MR MR THORACIC SPINE WO/W CM
7 of 13 series · 19 of 48 positions shown · IV contrast (6 ML GADAVIST)
Comparison: Comparison made with prior MRIs of the head and
cervical spine from [DATE] and [DATE].

CLINICAL DATA: Initial evaluation for demyelinating disease, back
pain.

EXAM:
MRI LUMBAR SPINE WITHOUT AND WITH CONTRAST
TECHNIQUE: Multiplanar and multiecho pulse sequences of the lumbar spine were
obtained without and with intravenous contrast.
CONTRAST:  6mL GADAVIST GADOBUTROL 1 MMOL/ML IV SOLN

[Series 10: T2 · sagittal · 3.0mm · 0.51mm/px · 2 of 20 slices shown (1 of 4)]
[im 1/20]
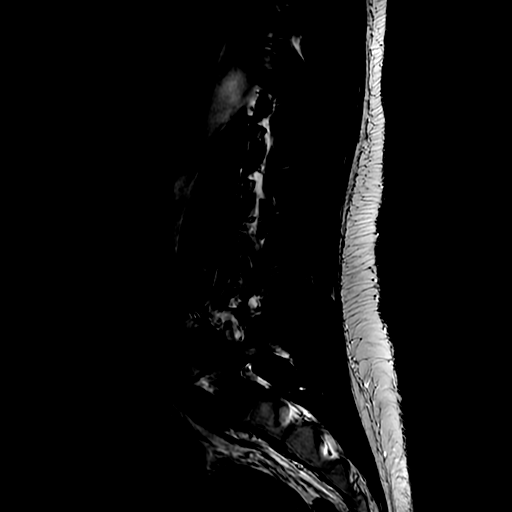
[im 20/20]
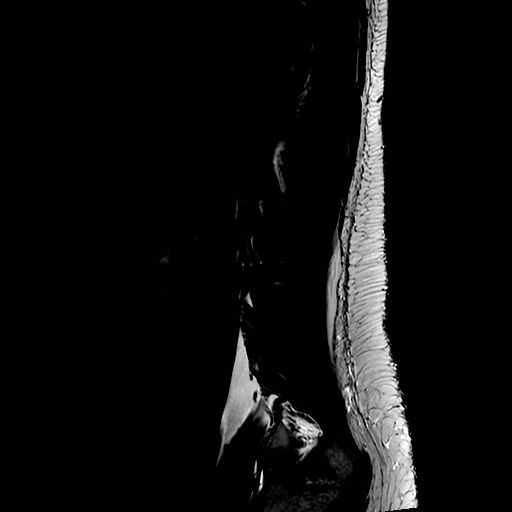

[Series 11: T1 · sagittal · 3.0mm · 0.90mm/px · 1 of 14 slices shown (1 of 3)]
[im 1/14]
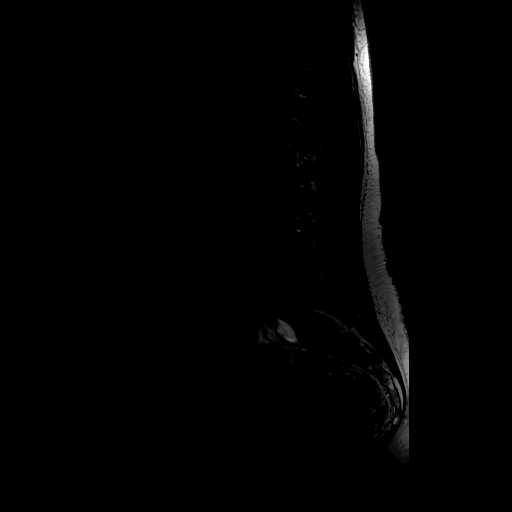

[Series 14: T1 · sagittal · 3.0mm · 0.51mm/px · 3 of 20 slices shown (2 of 3)]
[im 1/20]
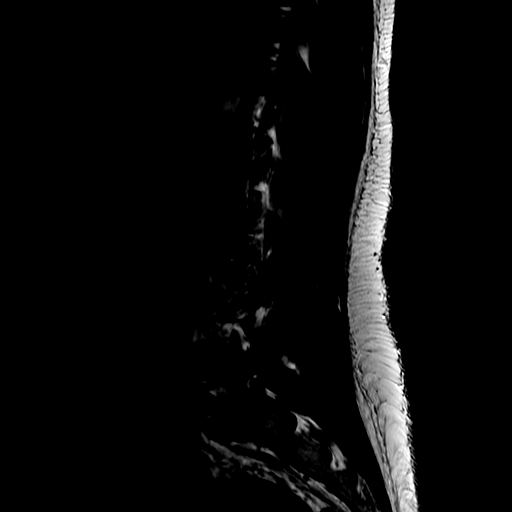
[im 10/20]
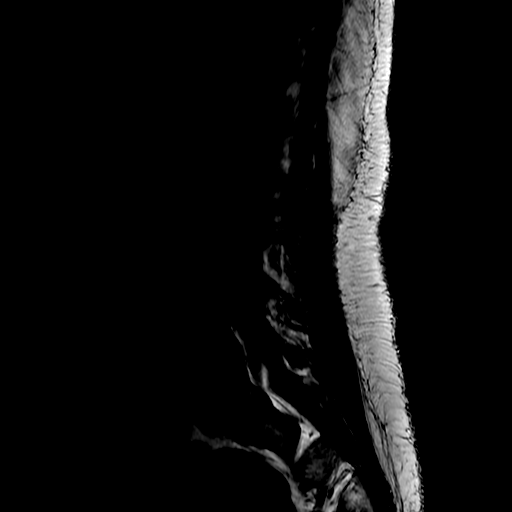
[im 20/20]
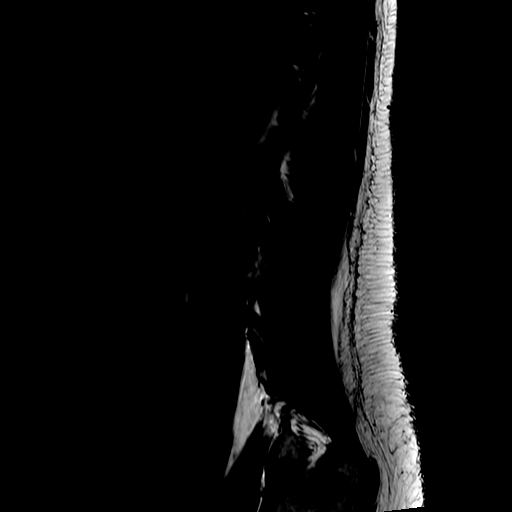

[Series 15: T2 · axial · 4.0mm · 0.39mm/px · z∈[-346,-244]mm · 3 of 20 slices shown (2 of 4)]
[im 1/20]
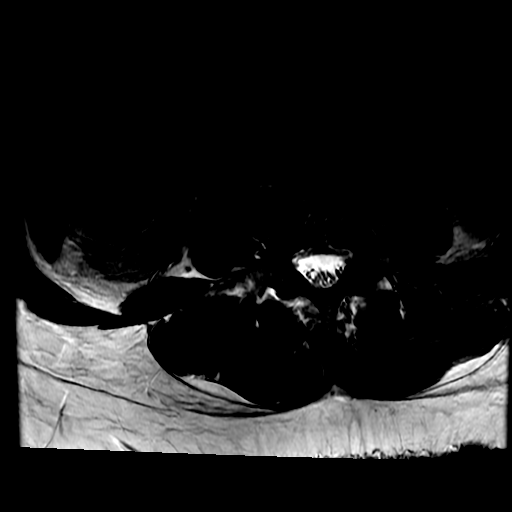
[im 10/20]
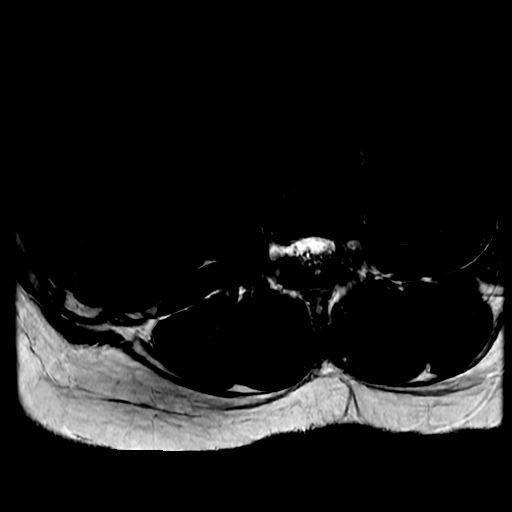
[im 20/20]
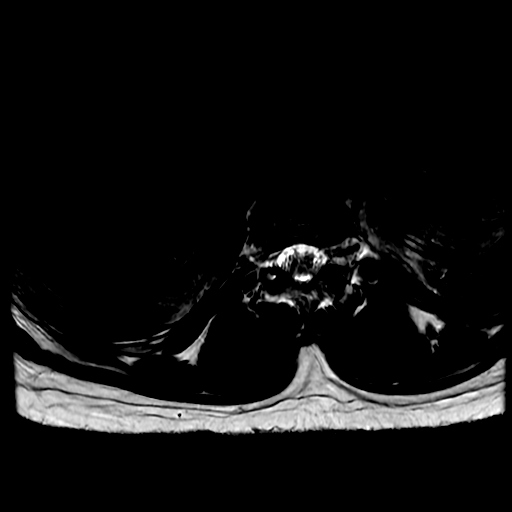

[Series 17: T2 · axial · 4.0mm · 0.39mm/px · z∈[-475,-344]mm · 4 of 26 slices shown (3 of 4)]
[im 1/26]
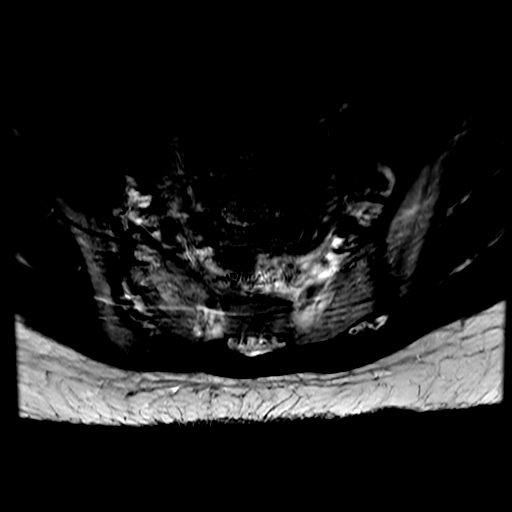
[im 9/26]
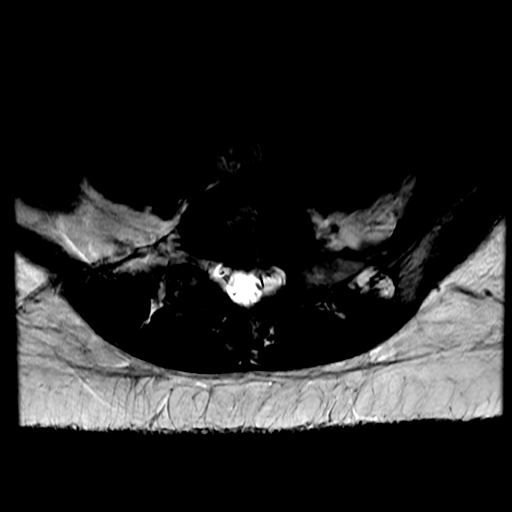
[im 17/26]
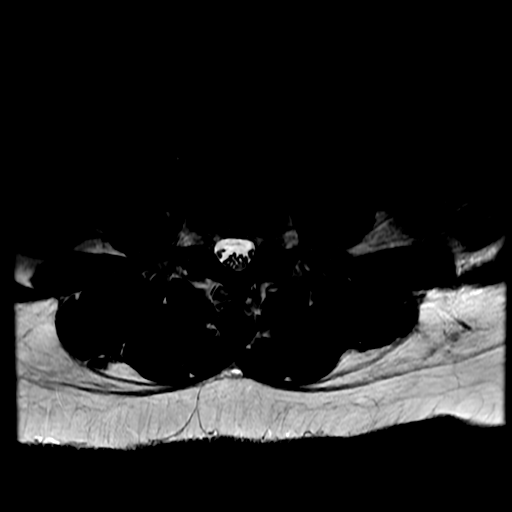
[im 26/26]
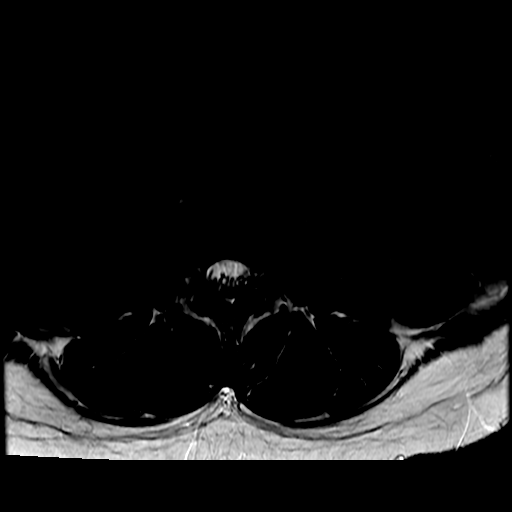

[Series 18: T1 · sagittal · 3.0mm · 0.94mm/px · 2 of 14 slices shown (3 of 3)]
[im 1/14]
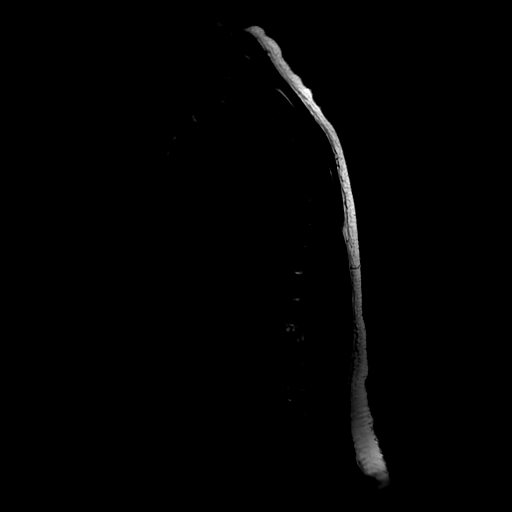
[im 14/14]
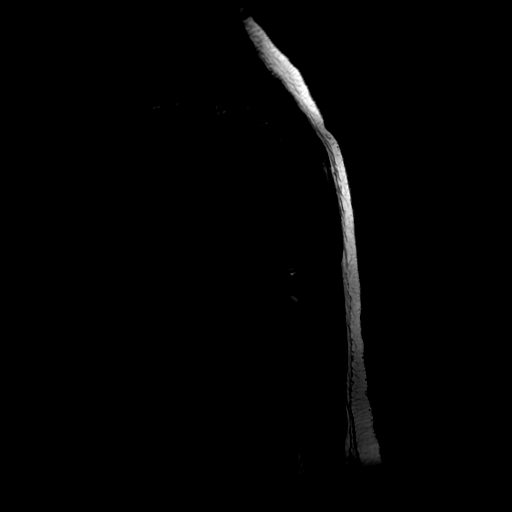

[Series 19: T2 · axial · 4.0mm · 0.39mm/px · z∈[-475,-344]mm · 4 of 26 slices shown (4 of 4)]
[im 1/26]
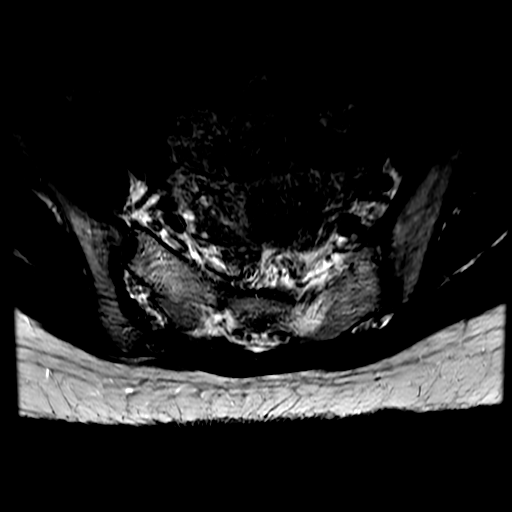
[im 9/26]
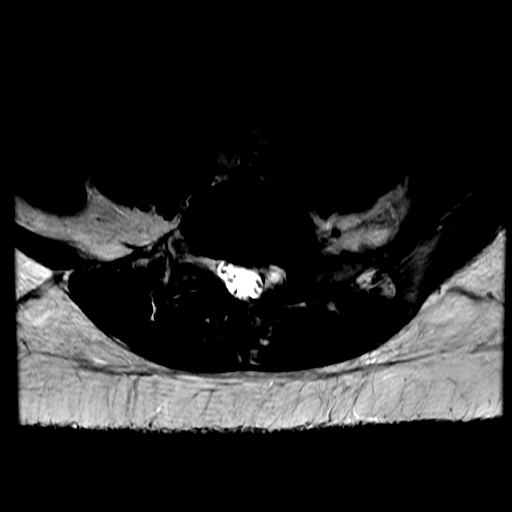
[im 17/26]
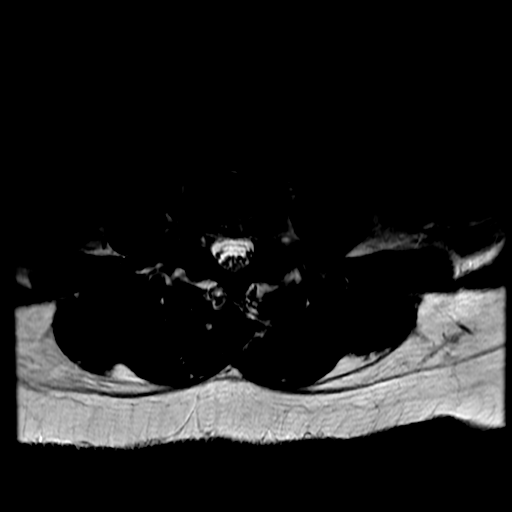
[im 26/26]
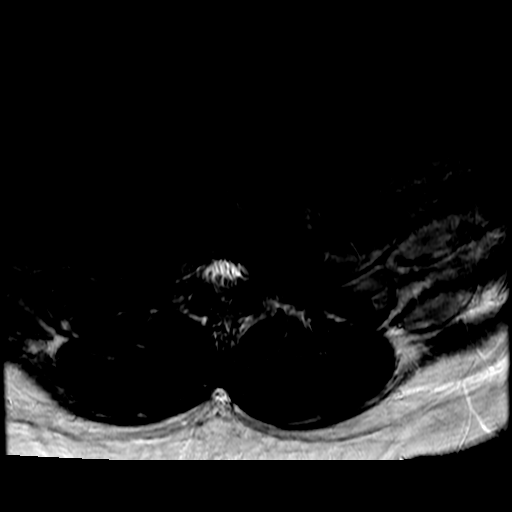

[19 of 48 positions shown; findings below may reference images not displayed]

FINDINGS: Segmentation: Standard. Lowest well-formed disc space labeled the
L5-S1 level.

Alignment: Trace scoliosis. Alignment otherwise normal with
preservation of the normal lumbar lordosis. No listhesis.

Vertebrae: Vertebral body height maintained without evidence for
acute or chronic fracture. Bone marrow signal intensity within
normal limits. No discrete or worrisome osseous lesions. No abnormal
marrow edema or enhancement.

Conus medullaris and cauda equina: Conus extends to the L1 level.
Conus and cauda equina appear normal.

Paraspinal and other soft tissues: Paraspinous soft tissues grossly
within normal limits on this motion degraded exam. Visualized
visceral structures grossly unremarkable.

Disc levels:

No significant disc pathology seen within the lumbar spine.
Intervertebral discs are well hydrated with preserved disc height.
No disc bulge or focal disc protrusion. Mild to moderate bilateral
facet hypertrophy at L3-4 through L5-S1. No significant spinal
stenosis. Mild to moderate bilateral L5 foraminal narrowing, right
worse than left due to facet degeneration.
IMPRESSION: 1. Normal MRI appearance of the conus medullaris and nerve roots of
the cauda equina.
2. Mild to moderate bilateral facet hypertrophy at L3-4 through
L5-S1. Resultant mild-to-moderate right greater than left L5
foraminal stenosis.
3. No other significant degenerative changes within the lumbar
spine. No other stenosis or neural impingement.

## 2019-03-26 MED ORDER — SODIUM CHLORIDE 0.9 % IV SOLN
1000.0000 mg | Freq: Every day | INTRAVENOUS | Status: AC
  Administered 2019-03-26 – 2019-03-29 (×5): 1000 mg via INTRAVENOUS
  Filled 2019-03-26 (×5): qty 8

## 2019-03-26 MED ORDER — ENSURE ENLIVE PO LIQD
237.0000 mL | Freq: Two times a day (BID) | ORAL | Status: DC
  Administered 2019-03-26 – 2019-03-30 (×9): 237 mL via ORAL

## 2019-03-26 MED ORDER — GADOBUTROL 1 MMOL/ML IV SOLN
6.0000 mL | Freq: Once | INTRAVENOUS | Status: AC | PRN
  Administered 2019-03-26: 6 mL via INTRAVENOUS

## 2019-03-26 MED ORDER — ADULT MULTIVITAMIN W/MINERALS CH
1.0000 | ORAL_TABLET | Freq: Every day | ORAL | Status: DC
  Administered 2019-03-26 – 2019-03-30 (×5): 1 via ORAL
  Filled 2019-03-26 (×5): qty 1

## 2019-03-26 NOTE — Progress Notes (Signed)
Initial Nutrition Assessment  DOCUMENTATION CODES:   Not applicable  INTERVENTION:   -Ensure Enlive po BID, each supplement provides 350 kcal and 20 grams of protein -MVI with minerals daily  NUTRITION DIAGNOSIS:   Increased nutrient needs related to chronic illness as evidenced by estimated needs.  GOAL:   Patient will meet greater than or equal to 90% of their needs  MONITOR:   PO intake, Supplement acceptance, Labs, Weight trends, Skin, I & O's  REASON FOR ASSESSMENT:   Malnutrition Screening Tool    ASSESSMENT:   Alicia Rojas is a 33 y.o. female with medical history significant of no significant past medical history except for PID who presented to the ER with 7 days of right foot weakness and numbness.  Patient started experiencing the symptoms out of nowhere.  She got out of bed and felt because she was unable to move her foot and toes.  The anterior foot is gradually getting numb especially on the lateral side.  She noted more swelling.  She has been dragging her feet from time to time.  Sensation is more like pins-and-needles.  No prior history like that.  Her only previous neurologic event was about 8 years ago when she had Bell's palsy.  Her mother apparently is died of some neurologic diseases including MS.  Patient was seen in the ER and evaluated.  Work-up with MRI and now shows evidence of multiple sclerosis.  Neurology evaluated patient and recommended admission and initiation of treatment.  She denied any other focal weakness.  No visual changes..  Pt admitted with multiple sclerosis.   Reviewed I/O's: +379 ml x 24 hours  Spoke with pt at bedside, who reports feeling better today. She shares that she always has a good appetite and usually consumes 2 meals per day (Breakfast: bacon, eggs, and toast; Dinner: meat, starch, and vegetable). Pt also shares she snacks on chips and snack foods throughout the day.   Per pt, she consumed 100% of breakfast today an  "feels starving". Pt shares her UBW is around 145# and estimates she has lost about 15# over the past month, which she relates to stress. Pt shares she has 3 teenage children at home who have been adjusting the to the COVID pandemic and attributes this to weight loss. However, no weight hx available to assess at this time. Pt reports this has happened in the past during another stressful period in her life and consumed small, frequent meals and drank Ensure to help regain lost weight. Pt requesting Ensure supplements which RD will order.   Medications reviewed and include solu-medrol.   Labs reviewed.   NUTRITION - FOCUSED PHYSICAL EXAM:    Most Recent Value  Orbital Region  No depletion  Upper Arm Region  No depletion  Thoracic and Lumbar Region  No depletion  Buccal Region  No depletion  Temple Region  No depletion  Clavicle Bone Region  No depletion  Clavicle and Acromion Bone Region  No depletion  Scapular Bone Region  No depletion  Dorsal Hand  No depletion  Patellar Region  No depletion  Anterior Thigh Region  No depletion  Posterior Calf Region  No depletion  Edema (RD Assessment)  None  Hair  Reviewed  Eyes  Reviewed  Mouth  Reviewed  Skin  Reviewed  Nails  Reviewed       Diet Order:   Diet Order            Diet regular Room service appropriate?  Yes; Fluid consistency: Thin  Diet effective now              EDUCATION NEEDS:   Education needs have been addressed  Skin:  Skin Assessment: Reviewed RN Assessment  Last BM:  03/26/19  Height:   Ht Readings from Last 1 Encounters:  03/25/19 5\' 1"  (1.549 m)    Weight:   Wt Readings from Last 1 Encounters:  03/25/19 59 kg    Ideal Body Weight:  47.7 kg  BMI:  Body mass index is 24.56 kg/m.  Estimated Nutritional Needs:   Kcal:  1600-1800  Protein:  80-95 grams  Fluid:  > 1.6 L    Carlosdaniel Grob A. 03/27/19, RD, LDN, CDCES Registered Dietitian II Certified Diabetes Care and Education  Specialist Pager: [phone] After hours Pager: [phone]

## 2019-03-26 NOTE — Plan of Care (Signed)

## 2019-03-26 NOTE — Progress Notes (Signed)
[facility] Progress Note  Patient: Alicia Rojas PIR:[id number]   PCP: Patient, No Pcp Per DOB: 1985-08-22   DOA: 03/25/2019   DOS: 03/26/2019   Date of Service: the patient was seen and examined on 03/26/2019  Chief Complaint  Patient presents with   Foot Pain   Brief hospital course: Alicia Rojas is a 33 y.o. female with medical history significant of no significant past medical history except for PID who presented to the ER with 7 days of right foot weakness and numbness.  Patient started experiencing the symptoms out of nowhere.  She got out of bed and felt because she was unable to move her foot and toes.  The anterior foot is gradually getting numb especially on the lateral side.  She noted more swelling.  She has been dragging her feet from time to time.  Sensation is more like pins-and-needles.  No prior history like that.  Her only previous neurologic event was about 8 years ago when she had Bell's palsy.  Her mother apparently is died of some neurologic diseases including MS. Patient was seen in the ER and evaluated.  Work-up with MRI and now shows evidence of multiple sclerosis.  Neurology evaluated patient and recommended admission and initiation of treatment.  Currently further plan is continue steroids.  Subjective: no acute complains. Still has numbness of the foot.   Assessment and Plan: Scheduled Meds:  enoxaparin (LOVENOX) injection  40 mg Subcutaneous QHS   feeding supplement (ENSURE ENLIVE)  237 mL Oral BID BM   multivitamin with minerals  1 tablet Oral Daily   Continuous Infusions:  methylPREDNISolone (SOLU-MEDROL) injection Stopped (03/26/19 1030)   PRN Meds: acetaminophen **OR** acetaminophen, ondansetron **OR** ondansetron (ZOFRAN) IV  multiple sclerosis:  New diagnosis.   Initiate IV steroids as recommended.   Any further care plan by neurology.  elevated blood pressure:  No diagnosis of hypertension.   Will follow closely and initiate  treatment if warranted.  Diet: Regular diet  DVT Prophylaxis: Subcutaneous Lovenox   Advance goals of care discussion: Full code  Family Communication: no family was present at bedside, at the time of interview.  Disposition:  Discharge to home.  Consultants: neurology Procedures: none  Antibiotics: Anti-infectives (From admission, onward)   None     Objective: Physical Exam: Vitals:   03/25/19 2301 03/26/19 0304 03/26/19 0832 03/26/19 1502  BP: (!) 170/106 (!) 152/90 (!) 160/99 (!) 165/95  Pulse: 75 76 92 84  Resp:  16 18 16   Temp:  98.3 F (36.8 C) 98.5 F (36.9 C) 98.9 F (37.2 C)  TempSrc:  Oral Oral Oral  SpO2:  100% 100% 100%  Weight:      Height:        Intake/Output Summary (Last 24 hours) at 03/26/2019 1842 Last data filed at 03/26/2019 1700 Gross per 24 hour  Intake 2187.71 ml  Output --  Net 2187.71 ml   Filed Weights   03/25/19 1025  Weight: 59 kg   General: alert and oriented to time, place, and person. Appear in mild distress, affect appropriate Eyes: PERRL, Conjunctiva normal ENT: Oral Mucosa Clear, moist  Neck: no JVD, no Abnormal Mass Or lumps Cardiovascular: S1 and S2 Present, no Murmur,  Respiratory: good respiratory effort, Bilateral Air entry equal and Decreased, no signs of accessory muscle use, Clear to Auscultation, no Crackles, no wheezes Abdomen: Bowel Sound present, Soft and no tenderness, no hernia Skin: no rashes  Extremities: no Pedal edema, no calf tenderness Neurologic:  without any new focal findings   Gait not checked due to patient safety concerns  Data Reviewed: I have personally reviewed and interpreted daily labs, tele strips, imagings as discussed above. I reviewed all nursing notes, pharmacy notes, vitals, pertinent old records I have discussed plan of care as described above with RN and patient/family.  CBC: Recent Labs  Lab 03/25/19 1055 03/26/19 0311  WBC 7.8 8.0  NEUTROABS 5.3  --   HGB 11.3* 11.5*  HCT  35.8* 35.5*  MCV 85.9 84.5  PLT 303 413   Basic Metabolic Panel: Recent Labs  Lab 03/25/19 1055 03/26/19 0311  NA 142 139  K 3.6 3.7  CL 108 106  CO2 26 25  GLUCOSE 99 119*  BUN 7 7  CREATININE 0.57 0.59  CALCIUM 9.2 8.9    Liver Function Tests: Recent Labs  Lab 03/26/19 0311  AST 18  ALT 18  ALKPHOS 46  BILITOT 0.4  PROT 6.5  ALBUMIN 3.7   No results for input(s): LIPASE, AMYLASE in the last 168 hours. No results for input(s): AMMONIA in the last 168 hours. Coagulation Profile: No results for input(s): INR, PROTIME in the last 168 hours. Cardiac Enzymes: No results for input(s): CKTOTAL, CKMB, CKMBINDEX, TROPONINI in the last 168 hours. BNP (last 3 results) No results for input(s): PROBNP in the last 8760 hours. CBG: No results for input(s): GLUCAP in the last 168 hours. Studies: MR CERVICAL SPINE W WO CONTRAST  Result Date: 03/26/2019 CLINICAL DATA:  Seven day history of right foot weakness and numbness. Brain findings consistent with multiple sclerosis. EXAM: MRI CERVICAL SPINE WITHOUT AND WITH CONTRAST TECHNIQUE: Multiplanar and multiecho pulse sequences of the cervical spine, to include the craniocervical junction and cervicothoracic junction, were obtained without and with intravenous contrast. CONTRAST:  69mL GADAVIST GADOBUTROL 1 MMOL/ML IV SOLN COMPARISON:  MRI brain done yesterday. FINDINGS: Alignment: Straightening of the normal cervical lordosis. Vertebrae: Discogenic endplate marrow changes at C4-5 which could be associated with neck pain. Cord: Widespread demyelinating disease throughout the cervical region from the cervicomedullary junction to the T1 level. Multiple discrete and indistinct MS plaques throughout the region. Cord swelling particularly in the region from C3 through C6. After contrast administration, there are areas of abnormal enhancement, most pronounced at the ventral right cord at the C4 level. Posterior Fossa, vertebral arteries, paraspinal  tissues: No soft tissue neck lesion seen. See results of brain MRI. Disc levels: C3-4: Disc bulge. Mild bilateral foraminal narrowing without apparent neural compression. C4-5: Spondylosis with endplate osteophytes and protruding disc material. Spinal stenosis with AP diameter in the midline as narrow as 8.9 mm. Effacement of the subarachnoid space. Bilateral foraminal stenosis could affect either C5 nerve. C5-6: Spondylosis with endplate osteophytes and protrusion of the disc more prominent towards the right. Spinal stenosis with AP diameter in the midline as narrow as 8.4 mm. Right foraminal stenosis likely to compress the right C6 nerve. C6-7: Spondylosis with endplate osteophytes and shallow protrusion of the disc. Spinal stenosis with AP diameter in the midline as narrow as 8.7 mm. Mild bilateral foraminal narrowing that could possibly affect either C7 nerve. C7-T1: Facet osteoarthritis. Mild bulging of the disc. No compressive stenosis. IMPRESSION: Advanced demyelinating disease in the region affecting from the cervicomedullary junction to the level of T1. Involvement most pronounced in the midcervical region with cord swelling. Areas of abnormal contrast enhancement, most notable in the ventral cord on the right at C4. Degenerative spondylosis also present, with spinal stenosis from C4-5  through C6-7, exacerbated by the cord swelling. Foraminal narrowing that could cause neural compression bilaterally at C4-5, on the right at C5-6 and bilateral at C6-7. Electronically Signed   By: Paulina Fusi M.D.   On: 03/26/2019 16:46   Time spent: 35 minutes  Author: Lynden Oxford, MD [facility] 03/26/2019 6:42 PM  To reach On-call, see care teams to locate the attending and reach out to them via [email or web address]. If 7PM-7AM, please contact night-coverage If you still have difficulty reaching the attending provider, please page the [facility] (Director on Call) for [facility] on amion for assistance.

## 2019-03-26 NOTE — Plan of Care (Signed)

## 2019-03-27 DIAGNOSIS — D72829 Elevated white blood cell count, unspecified: Secondary | ICD-10-CM

## 2019-03-27 DIAGNOSIS — R739 Hyperglycemia, unspecified: Secondary | ICD-10-CM

## 2019-03-27 DIAGNOSIS — D649 Anemia, unspecified: Secondary | ICD-10-CM

## 2019-03-27 LAB — CBC WITH DIFFERENTIAL/PLATELET
Abs Immature Granulocytes: 0.16 10*3/uL — ABNORMAL HIGH (ref 0.00–0.07)
Basophils Absolute: 0 10*3/uL (ref 0.0–0.1)
Basophils Relative: 0 %
Eosinophils Absolute: 0 10*3/uL (ref 0.0–0.5)
Eosinophils Relative: 0 %
HCT: 34.9 % — ABNORMAL LOW (ref 36.0–46.0)
Hemoglobin: 11.3 g/dL — ABNORMAL LOW (ref 12.0–15.0)
Immature Granulocytes: 1 %
Lymphocytes Relative: 4 %
Lymphs Abs: 1.1 10*3/uL (ref 0.7–4.0)
MCH: 27 pg (ref 26.0–34.0)
MCHC: 32.4 g/dL (ref 30.0–36.0)
MCV: 83.5 fL (ref 80.0–100.0)
Monocytes Absolute: 1.3 10*3/uL — ABNORMAL HIGH (ref 0.1–1.0)
Monocytes Relative: 5 %
Neutro Abs: 22.5 10*3/uL — ABNORMAL HIGH (ref 1.7–7.7)
Neutrophils Relative %: 90 %
Platelets: 355 10*3/uL (ref 150–400)
RBC: 4.18 MIL/uL (ref 3.87–5.11)
RDW: 17.4 % — ABNORMAL HIGH (ref 11.5–15.5)
WBC: 25 10*3/uL — ABNORMAL HIGH (ref 4.0–10.5)
nRBC: 0 % (ref 0.0–0.2)

## 2019-03-27 LAB — COMPREHENSIVE METABOLIC PANEL
ALT: 16 U/L (ref 0–44)
AST: 15 U/L (ref 15–41)
Albumin: 3.7 g/dL (ref 3.5–5.0)
Alkaline Phosphatase: 41 U/L (ref 38–126)
Anion gap: 8 (ref 5–15)
BUN: 10 mg/dL (ref 6–20)
CO2: 25 mmol/L (ref 22–32)
Calcium: 9.4 mg/dL (ref 8.9–10.3)
Chloride: 109 mmol/L (ref 98–111)
Creatinine, Ser: 0.6 mg/dL (ref 0.44–1.00)
GFR calc Af Amer: >60 mL/min (ref >60)
GFR calc non Af Amer: >60 mL/min (ref >60)
Glucose, Bld: 122 mg/dL — ABNORMAL HIGH (ref 70–99)
Potassium: 3.7 mmol/L (ref 3.5–5.1)
Sodium: 142 mmol/L (ref 135–145)
Total Bilirubin: <0.1 mg/dL — ABNORMAL LOW (ref 0.3–1.2)
Total Protein: 6.5 g/dL (ref 6.5–8.1)

## 2019-03-27 LAB — MAGNESIUM: Magnesium: 1.8 mg/dL (ref 1.7–2.4)

## 2019-03-27 MED ORDER — LABETALOL HCL 5 MG/ML IV SOLN
10.0000 mg | INTRAVENOUS | Status: DC | PRN
  Administered 2019-03-28 – 2019-03-29 (×3): 10 mg via INTRAVENOUS
  Filled 2019-03-27 (×3): qty 4

## 2019-03-27 NOTE — Progress Notes (Signed)
PROGRESS NOTE    Alicia Rojas  KZS:[id number] DOB: [birth year] DOA: 03/25/2019 PCP: Patient, No Pcp Per   Brief Narrative: The patient Alicia Rojas Josephis a 33 y.o.femalewith medical history significant ofno significant past medical history except for PID who presented to the ER with 7 days of right foot weakness and numbness. Patient started experiencing the symptoms out of nowhere. She got out of bed and felt because she was unable to move her foot and toes. The anterior foot is gradually getting numb especially on the lateral side. She noted more swelling. She has been dragging her feet from time to time. Sensation is more like pins-and-needles. No prior history like that. Her only previous neurologic event was about 8 years ago when she had Bell's palsy. Her mother apparently had died of some neurologic diseases including MS. Patient was seen in the ER and evaluated. Work-up with MRI and now shows evidence of multiple sclerosis. Neurology evaluated patient and recommended admission and initiation of treatment and Currently further plan is continue steroids x5 days. (Today is day 2/5)  Assessment & Plan:   Principal Problem:   Multiple sclerosis (HCC) Active Problems:   Elevated blood pressure reading without diagnosis of hypertension  Multiple Sclerosiswith a presentation of foot drop -New diagnosis.  -DG foot on the right side showed "There is no evidence of fracture or dislocation. There is no evidence of arthropathy or other focal bone abnormality. Soft tissues are unremarkable." -MRI Cervical Spine showed "Advanced demyelinating disease in the region affecting from the cervicomedullary junction to the level of T1. Involvement most pronounced in the midcervical region with cord swelling. Areas of abnormal contrast enhancement, most notable in the ventral cord on the right at C4. Degenerative spondylosis also present, with spinal stenosis from C4-5 through C6-7,  exacerbated by the cord swelling. Foraminal narrowing that could cause neural compression bilaterally at C4-5, on the right at C5-6 and bilateral at C6-7." -MRI Lumbar and Thoracic Spine showed "Normal MRI appearance of the conus medullaris and nerve roots of the cauda equina. Mild to moderate bilateral facet hypertrophy at L3-4 through L5-S1. Resultant mild-to-moderate right greater than left L5 foraminal stenosis. No other significant degenerative changes within the lumbar."  spine. No other stenosis or neural impingement -Started on IV Steroids 1000 mg of Methylprednisolone Daily x5 Days -Neurology recommends the patient be started on Copaxone at D/C -Appreciate Any further care plan by Neurology and patient is to follow up with [facility] in the outpatient   Elevated Blood Pressure -No prior diagnosis of hypertension.  -Will follow closely and initiate treatment of SBP>160 or DBP>100 with IV Labetalol 10 mg q2hprn  -BP this AM was -Continue to Monitor BP per Protocol   Hyperglycemia -In the setting of IV Steroid Demargination -Blood Sugar was elevated and was 122 on CMP this AM -Check HbA1c to r/o Diabetes Mellitus  -Continue to Monitor and Trend Blood Sugars carefully and if necessary will place on Sensitive Novolog SSI AC   Leukocytosis -In the setting of IV Steroid Demargination -WBC went from 8.0 -> 25.0 -Continue to Monitor for S/Sx of Infection -Repeat CBCin AM   Normocytic Anemia -Patient's hemoglobin/hematocrit has been relatively stable and is now 11.3/34.9 -Check anemia panel in the a.m. -Continue to monitor for signs and symptoms of bleeding; currently no overt bleeding noted but she did have greater than 50 RBCs per high-power field in her urinalysis -Repeat CBC in a.m.  DVT prophylaxis: Enoxaparin 40 mg sq q24h Code Status: FULL  CODE  Family Communication: No family present at bedside  Disposition Plan: Pending Further Neurology Recc's and PT/OT Evaluation    Consultants:   Neurology Dr. Otelia LimesLindzen   Procedures: MRI's as above   Antimicrobials:  Anti-infectives (From admission, onward)   None     Subjective: Seen and examined at bedside and she is still complaining of right foot drop.  Also complained that her IV was hurting.  No chest pain, lightheadedness or dizziness.  No other concerns or points at this time.  Objective: Vitals:   03/26/19 2018 03/26/19 2141 03/27/19 0443 03/27/19 0903  BP: (!) 157/100 (!) 170/97 (!) 158/97 (!) 153/102  Pulse: 83 81 82 78  Resp: 16 17 14 18   Temp: 98.2 F (36.8 C)  98.3 F (36.8 C) 98.7 F (37.1 C)  TempSrc: Oral  Oral Oral  SpO2: 100%  100% 100%  Weight:      Height:        Intake/Output Summary (Last 24 hours) at 03/27/2019 [id number] Last data filed at 03/26/2019 1700 Gross per 24 hour  Intake 1508.39 ml  Output --  Net 1508.39 ml   Filed Weights   03/25/19 1025  Weight: 59 kg   Examination: Physical Exam:  Constitutional: WN/WD thin African-American female currently in NAD and appears calm Eyes: Lids and conjunctivae normal, sclerae anicteric  ENMT: External Ears, Nose appear normal. Grossly normal hearing.  Neck: Appears normal, supple, no cervical masses, normal ROM, no appreciable thyromegaly; no JVD Respiratory: Clear to auscultation bilaterally, no wheezing, rales, rhonchi or crackles. Normal respiratory effort and patient is not tachypenic. No accessory muscle use.  Cardiovascular: RRR, no murmurs / rubs / gallops. S1 and S2 auscultated. No extremity edema.  Abdomen: Soft, non-tender, non-distended. Bowel sounds positive.  GU: Deferred. Musculoskeletal: No clubbing / cyanosis of digits/nails. No joint deformity upper and lower extremities. Skin: No rashes, lesions, ulcers on a limited skin evaluation. No induration; Warm and dry.  Neurologic: CN 2-12 grossly intact with no focal deficits. Has a foot Drop on the Right  Psychiatric: Normal judgment and insight. Alert and  oriented x 3. Normal mood and appropriate affect.   Data Reviewed: I have personally reviewed following labs and imaging studies  CBC: Recent Labs  Lab 03/25/19 1055 03/26/19 0311 03/27/19 0508  WBC 7.8 8.0 25.0*  NEUTROABS 5.3  --  22.5*  HGB 11.3* 11.5* 11.3*  HCT 35.8* 35.5* 34.9*  MCV 85.9 84.5 83.5  PLT 303 321 355   Basic Metabolic Panel: Recent Labs  Lab 03/25/19 1055 03/26/19 0311 03/27/19 0508  NA 142 139 142  K 3.6 3.7 3.7  CL 108 106 109  CO2 26 25 25   GLUCOSE 99 119* 122*  BUN 7 7 10   CREATININE 0.57 0.59 0.60  CALCIUM 9.2 8.9 9.4  MG  --   --  1.8   GFR: Estimated Creatinine Clearance: 81.8 mL/min (by C-G formula based on SCr of 0.6 mg/dL). Liver Function Tests: Recent Labs  Lab 03/26/19 0311 03/27/19 0508  AST 18 15  ALT 18 16  ALKPHOS 46 41  BILITOT 0.4 <0.1*  PROT 6.5 6.5  ALBUMIN 3.7 3.7   No results for input(s): LIPASE, AMYLASE in the last 168 hours. No results for input(s): AMMONIA in the last 168 hours. Coagulation Profile: No results for input(s): INR, PROTIME in the last 168 hours. Cardiac Enzymes: No results for input(s): CKTOTAL, CKMB, CKMBINDEX, TROPONINI in the last 168 hours. BNP (last 3 results) No results for input(s):  PROBNP in the last 8760 hours. HbA1C: No results for input(s): HGBA1C in the last 72 hours. CBG: No results for input(s): GLUCAP in the last 168 hours. Lipid Profile: No results for input(s): CHOL, HDL, LDLCALC, TRIG, CHOLHDL, LDLDIRECT in the last 72 hours. Thyroid Function Tests: No results for input(s): TSH, T4TOTAL, FREET4, T3FREE, THYROIDAB in the last 72 hours. Anemia Panel: Recent Labs    03/26/19 0311  VITAMINB12 296   Sepsis Labs: No results for input(s): PROCALCITON, LATICACIDVEN in the last 168 hours.  Recent Results (from the past 240 hour(s))  SARS CORONAVIRUS 2 (TAT 6-24 HRS) Nasopharyngeal Nasopharyngeal Swab     Status: None   Collection Time: 03/25/19 10:55 AM   Specimen:  Nasopharyngeal Swab  Result Value Ref Range Status   SARS Coronavirus 2 NEGATIVE NEGATIVE Final    Comment: (NOTE) SARS-CoV-2 target nucleic acids are NOT DETECTED. The SARS-CoV-2 RNA is generally detectable in upper and lower respiratory specimens during the acute phase of infection. Negative results do not preclude SARS-CoV-2 infection, do not rule out co-infections with other pathogens, and should not be used as the sole basis for treatment or other patient management decisions. Negative results must be combined with clinical observations, patient history, and epidemiological information. The expected result is Negative. Fact Sheet for Patients: HairSlick.no Fact Sheet for Healthcare Providers: quierodirigir.com This test is not yet approved or cleared by the [place] FDA and  has been authorized for detection and/or diagnosis of SARS-CoV-2 by FDA under an Emergency Use Authorization (EUA). This EUA will remain  in effect (meaning this test can be used) for the duration of the COVID-19 declaration under Section 56 4(b)(1) of the Act, 21 U.S.C. section 360bbb-3(b)(1), unless the authorization is terminated or revoked sooner. Performed at [facility], [address] [place], [place] [address]   Urine culture     Status: Abnormal   Collection Time: 03/25/19  6:12 PM   Specimen: Urine, Random  Result Value Ref Range Status   Specimen Description URINE, RANDOM  Final   Special Requests NONE  Final   Culture (A)  Final    <10,000 COLONIES/mL INSIGNIFICANT GROWTH Performed at [facility], [address] [place], [place] [address]    Report Status 03/26/2019 FINAL  Final    RN Pressure Injury Documentation:     Estimated body mass index is 24.56 kg/m as calculated from the following:   Height as of this encounter: 5\' 1"  (1.549 m).   Weight as of this encounter: 59 kg.  Malnutrition Type:  Nutrition  Problem: Increased nutrient needs Etiology: chronic illness   Malnutrition Characteristics:  Signs/Symptoms: estimated needs   Nutrition Interventions:  Interventions: Ensure Enlive (each supplement provides 350kcal and 20 grams of protein), MVI   Radiology Studies: MR Brain W and Wo Contrast  Result Date: 03/25/2019 CLINICAL DATA:  Foot numbness and weakness. Question multiple sclerosis. Symptoms began 5-7 days ago. EXAM: MRI HEAD WITHOUT AND WITH CONTRAST TECHNIQUE: Multiplanar, multiecho pulse sequences of the brain and surrounding structures were obtained without and with intravenous contrast. CONTRAST:  5 cc Gadavist COMPARISON:  Head CT 02/01/2013 FINDINGS: Brain: Diffusion imaging does not show any acute or subacute infarction or other cause of restricted diffusion. There are multiple foci of abnormal T2 and FLAIR signal with involvement of the upper cervical spinal cord, middle cerebellar peduncles, and extensive involvement throughout the deep and subcortical white matter of both cerebral hemispheres. The lesions are typical of multiple sclerosis lesions.  No sign of ischemic infarction. No mass lesion, hemorrhage, hydrocephalus or extra-axial collection. Low level enhancement is seen only along the margins of a white matter lesion in the forceps major area on the left. Vascular: Major vessels at the base of the brain show flow. Skull and upper cervical spine: Negative Sinuses/Orbits: Clear/normal Other: None IMPRESSION: Extensive widespread findings of multiple sclerosis. Multiple white matter lesions of the cerebral hemispheres affecting the deep and subcortical white matter. More subtle lesions affecting the cerebellum in the middle cerebellar peduncle regions and affecting the upper cervical spinal cord as visualized. No lesions show restricted diffusion. Low level contrast enhancement along the margins of a white matter lesion in the left forceps major region. Electronically Signed    By: Nelson Chimes M.D.   On: 03/25/2019 16:40   MR CERVICAL SPINE W WO CONTRAST  Result Date: 03/26/2019 CLINICAL DATA:  Seven day history of right foot weakness and numbness. Brain findings consistent with multiple sclerosis. EXAM: MRI CERVICAL SPINE WITHOUT AND WITH CONTRAST TECHNIQUE: Multiplanar and multiecho pulse sequences of the cervical spine, to include the craniocervical junction and cervicothoracic junction, were obtained without and with intravenous contrast. CONTRAST:  18mL GADAVIST GADOBUTROL 1 MMOL/ML IV SOLN COMPARISON:  MRI brain done yesterday. FINDINGS: Alignment: Straightening of the normal cervical lordosis. Vertebrae: Discogenic endplate marrow changes at C4-5 which could be associated with neck pain. Cord: Widespread demyelinating disease throughout the cervical region from the cervicomedullary junction to the T1 level. Multiple discrete and indistinct MS plaques throughout the region. Cord swelling particularly in the region from C3 through C6. After contrast administration, there are areas of abnormal enhancement, most pronounced at the ventral right cord at the C4 level. Posterior Fossa, vertebral arteries, paraspinal tissues: No soft tissue neck lesion seen. See results of brain MRI. Disc levels: C3-4: Disc bulge. Mild bilateral foraminal narrowing without apparent neural compression. C4-5: Spondylosis with endplate osteophytes and protruding disc material. Spinal stenosis with AP diameter in the midline as narrow as 8.9 mm. Effacement of the subarachnoid space. Bilateral foraminal stenosis could affect either C5 nerve. C5-6: Spondylosis with endplate osteophytes and protrusion of the disc more prominent towards the right. Spinal stenosis with AP diameter in the midline as narrow as 8.4 mm. Right foraminal stenosis likely to compress the right C6 nerve. C6-7: Spondylosis with endplate osteophytes and shallow protrusion of the disc. Spinal stenosis with AP diameter in the midline as  narrow as 8.7 mm. Mild bilateral foraminal narrowing that could possibly affect either C7 nerve. C7-T1: Facet osteoarthritis. Mild bulging of the disc. No compressive stenosis. IMPRESSION: Advanced demyelinating disease in the region affecting from the cervicomedullary junction to the level of T1. Involvement most pronounced in the midcervical region with cord swelling. Areas of abnormal contrast enhancement, most notable in the ventral cord on the right at C4. Degenerative spondylosis also present, with spinal stenosis from C4-5 through C6-7, exacerbated by the cord swelling. Foraminal narrowing that could cause neural compression bilaterally at C4-5, on the right at C5-6 and bilateral at C6-7. Electronically Signed   By: Nelson Chimes M.D.   On: 03/26/2019 16:46   MR THORACIC SPINE W WO CONTRAST  Result Date: 03/27/2019 CLINICAL DATA:  Initial evaluation for demyelinating disease, back pain. EXAM: MRI LUMBAR SPINE WITHOUT AND WITH CONTRAST TECHNIQUE: Multiplanar and multiecho pulse sequences of the lumbar spine were obtained without and with intravenous contrast. CONTRAST:  16mL GADAVIST GADOBUTROL 1 MMOL/ML IV SOLN COMPARISON:  Comparison made with prior MRIs of the head  and cervical spine from 03/25/2019 and 03/26/2019. FINDINGS: Segmentation: Standard. Lowest well-formed disc space labeled the L5-S1 level. Alignment: Trace scoliosis. Alignment otherwise normal with preservation of the normal lumbar lordosis. No listhesis. Vertebrae: Vertebral body height maintained without evidence for acute or chronic fracture. Bone marrow signal intensity within normal limits. No discrete or worrisome osseous lesions. No abnormal marrow edema or enhancement. Conus medullaris and cauda equina: Conus extends to the L1 level. Conus and cauda equina appear normal. Paraspinal and other soft tissues: Paraspinous soft tissues grossly within normal limits on this motion degraded exam. Visualized visceral structures grossly  unremarkable. Disc levels: No significant disc pathology seen within the lumbar spine. Intervertebral discs are well hydrated with preserved disc height. No disc bulge or focal disc protrusion. Mild to moderate bilateral facet hypertrophy at L3-4 through L5-S1. No significant spinal stenosis. Mild to moderate bilateral L5 foraminal narrowing, right worse than left due to facet degeneration. IMPRESSION: 1. Normal MRI appearance of the conus medullaris and nerve roots of the cauda equina. 2. Mild to moderate bilateral facet hypertrophy at L3-4 through L5-S1. Resultant mild-to-moderate right greater than left L5 foraminal stenosis. 3. No other significant degenerative changes within the lumbar spine. No other stenosis or neural impingement. Electronically Signed   By: Rise MuBenjamin  McClintock M.D.   On: 03/27/2019 03:32   MR Lumbar Spine W Wo Contrast  Result Date: 03/27/2019 CLINICAL DATA:  Initial evaluation for demyelinating disease, back pain. EXAM: MRI LUMBAR SPINE WITHOUT AND WITH CONTRAST TECHNIQUE: Multiplanar and multiecho pulse sequences of the lumbar spine were obtained without and with intravenous contrast. CONTRAST:  6mL GADAVIST GADOBUTROL 1 MMOL/ML IV SOLN COMPARISON:  Comparison made with prior MRIs of the head and cervical spine from 03/25/2019 and 03/26/2019. FINDINGS: Segmentation: Standard. Lowest well-formed disc space labeled the L5-S1 level. Alignment: Trace scoliosis. Alignment otherwise normal with preservation of the normal lumbar lordosis. No listhesis. Vertebrae: Vertebral body height maintained without evidence for acute or chronic fracture. Bone marrow signal intensity within normal limits. No discrete or worrisome osseous lesions. No abnormal marrow edema or enhancement. Conus medullaris and cauda equina: Conus extends to the L1 level. Conus and cauda equina appear normal. Paraspinal and other soft tissues: Paraspinous soft tissues grossly within normal limits on this motion degraded  exam. Visualized visceral structures grossly unremarkable. Disc levels: No significant disc pathology seen within the lumbar spine. Intervertebral discs are well hydrated with preserved disc height. No disc bulge or focal disc protrusion. Mild to moderate bilateral facet hypertrophy at L3-4 through L5-S1. No significant spinal stenosis. Mild to moderate bilateral L5 foraminal narrowing, right worse than left due to facet degeneration. IMPRESSION: 1. Normal MRI appearance of the conus medullaris and nerve roots of the cauda equina. 2. Mild to moderate bilateral facet hypertrophy at L3-4 through L5-S1. Resultant mild-to-moderate right greater than left L5 foraminal stenosis. 3. No other significant degenerative changes within the lumbar spine. No other stenosis or neural impingement. Electronically Signed   By: Rise MuBenjamin  McClintock M.D.   On: 03/27/2019 03:32   DG Chest Portable 1 View  Result Date: 03/25/2019 CLINICAL DATA:  Steroid therapy with concern for pneumonia EXAM: PORTABLE CHEST 1 VIEW COMPARISON:  February 18, 2009 FINDINGS: Lungs are clear. Heart size and pulmonary vascularity are within normal limits. No adenopathy. No bone lesions. IMPRESSION: No edema or consolidation.  No evident adenopathy. Electronically Signed   By: Bretta BangWilliam  Woodruff III M.D.   On: 03/25/2019 18:55   DG Foot Complete Right  Result Date: 03/25/2019  CLINICAL DATA:  Numbness of the right foot over the last week. Unable to move the foot. EXAM: RIGHT FOOT COMPLETE - 3+ VIEW COMPARISON:  None. FINDINGS: There is no evidence of fracture or dislocation. There is no evidence of arthropathy or other focal bone abnormality. Soft tissues are unremarkable. IMPRESSION: Negative. Electronically Signed   By: Paulina Fusi M.D.   On: 03/25/2019 11:01   Scheduled Meds: . enoxaparin (LOVENOX) injection  40 mg Subcutaneous QHS  . feeding supplement (ENSURE ENLIVE)  237 mL Oral BID BM  . multivitamin with minerals  1 tablet Oral Daily    Continuous Infusions: . methylPREDNISolone (SOLU-MEDROL) injection Stopped (03/26/19 1030)     LOS: 2 days   Merlene Laughter, DO [facility] PAGER is on AMION  If 7PM-7AM, please contact night-coverage [email or web address]

## 2019-03-27 NOTE — Evaluation (Signed)
Physical Therapy Evaluation Patient Details Name: Alicia Rojas MRN: [id number] DOB: November 02, 1985 Today's Date: 03/27/2019   History of Present Illness  Pt is a 33 y/o female admitted secondary to R LE numbness and drop foot. MRI revealed extensive widespread findings of multiple sclerosis. No pertinent PMH.    Clinical Impression  Pt presented sitting upright at EOB, awake and willing to participate in therapy session. Prior to admission, pt reported that she was independent with all functional mobility and ADLs. Pt lives with her children in a single level home with one step to enter. She works full-time at [employer]. At the time of evaluation, pt overall moving well with supervision for ambulation with and without an AD. Pt with improved gait pattern and stability with use of RW. Hopeful pt with progress well while admitted and receiving steroids. However, would benefit from OP PT follow-up as well. Pt would continue to benefit from skilled physical therapy services at this time while admitted and after d/c to address the below listed limitations in order to improve overall safety and independence with functional mobility.     Follow Up Recommendations Outpatient PT (NEURO)    Equipment Recommendations  Rolling walker with 5" wheels    Recommendations for Other Services       Precautions / Restrictions Precautions Precautions: Fall Restrictions Weight Bearing Restrictions: No      Mobility  Bed Mobility               General bed mobility comments: pt seated EOB upon arrival  Transfers Overall transfer level: Independent Equipment used: None                Ambulation/Gait Ambulation/Gait assistance: Supervision Gait Distance (Feet): 50 Feet Assistive device: None;Rolling walker (2 wheeled) Gait Pattern/deviations: Step-through pattern;Decreased stride length;Decreased dorsiflexion - right Gait velocity: decreased   General Gait Details: pt initially  ambulating a short distance without use of RW. Without an AD, pt with great difficulty with foot clearance during swing phase of gait on R and zero heelstrike. Pt then ambulating with use of RW with improved foot clearance on R, with cueing pt was able to achieve intermittent heelstrike on R LE.   Stairs            Wheelchair Mobility    Modified Rankin (Stroke Patients Only)       Balance Overall balance assessment: Needs assistance Sitting-balance support: Feet supported Sitting balance-Leahy Scale: Good     Standing balance support: No upper extremity supported Standing balance-Leahy Scale: Good                               Pertinent Vitals/Pain Pain Assessment: No/denies pain    Home Living Family/patient expects to be discharged to:: Private residence Living Arrangements: Children Available Help at Discharge: Family;Available 24 hours/day Type of Home: House Home Access: Stairs to enter   [employer] of Steps: 1 Home Layout: One level Home Equipment: None      Prior Function Level of Independence: Independent         Comments: works at "[employer]        Extremity/Trunk Assessment   Upper Extremity Assessment Upper Extremity Assessment: Overall WFL for tasks assessed    Lower Extremity Assessment Lower Extremity Assessment: RLE deficits/detail RLE Deficits / Details: Pt able to minimally flex/extend toes through partial ROM; pt with very minimal activation of  ankle dorsiflexors and truly activating more supinators than DF. Pt with decreased sensation to light touch to dorsal and plantar aspect of foot    Cervical / Trunk Assessment Cervical / Trunk Assessment: Normal  Communication   Communication: No difficulties  Cognition Arousal/Alertness: Awake/alert Behavior During Therapy: WFL for tasks assessed/performed Overall Cognitive Status: Within Functional Limits for tasks assessed                                         General Comments      Exercises     Assessment/Plan    PT Assessment Patient needs continued PT services  PT Problem List Decreased strength;Decreased mobility;Decreased coordination;Decreased knowledge of use of DME;Decreased safety awareness       PT Treatment Interventions DME instruction;Stair training;Gait training;Functional mobility training;Therapeutic activities;Therapeutic exercise;Balance training;Neuromuscular re-education;Patient/family education    PT Goals (Current goals can be found in the Care Plan section)  Acute Rehab PT Goals Patient Stated Goal: "go home" PT Goal Formulation: With patient Time For Goal Achievement: 04/10/19 Potential to Achieve Goals: Good    Frequency Min 3X/week   Barriers to discharge        Co-evaluation               AM-PAC PT "6 Clicks" Mobility  Outcome Measure Help needed turning from your back to your side while in a flat bed without using bedrails?: None Help needed moving from lying on your back to sitting on the side of a flat bed without using bedrails?: None Help needed moving to and from a bed to a chair (including a wheelchair)?: None Help needed standing up from a chair using your arms (e.g., wheelchair or bedside chair)?: None Help needed to walk in hospital room?: None Help needed climbing 3-5 steps with a railing? : A Little 6 Click Score: 23    End of Session   Activity Tolerance: Patient tolerated treatment well Patient left: in bed;with call bell/phone within reach;Other (comment)(sitting EOB) Nurse Communication: Mobility status PT Visit Diagnosis: Other abnormalities of gait and mobility (R26.89)    Time: 2683-4196 PT Time Calculation (min) (ACUTE ONLY): 11 min   Charges:   PT Evaluation $PT Eval Low Complexity: 1 Low          Eduard Clos, PT, DPT  Acute Rehabilitation Services Pager [phone] Office [place] 03/27/2019, 4:37 PM

## 2019-03-27 NOTE — Progress Notes (Signed)
Neurology Progress Note   S:// Patient seen and examined. She has completed the MRI of the whole spinal cord. Reports no significant side effects from the steroids. Clinically, reports being the same with no significant improvement or worsening.  Remains concerned about the right foot drop and the right foot swelling.  O:// Current vital signs: BP (!) 179/99 (BP Location: Left Arm)   Pulse 76   Temp 99.7 F (37.6 C) (Oral)   Resp 18   Ht 5\' 1"  (1.549 m)   Wt 59 kg   LMP 03/24/2019   SpO2 99%   BMI 24.56 kg/m  Vital signs in last 24 hours: Temp:  [98.2 F (36.8 C)-99.7 F (37.6 C)] 99.7 F (37.6 C) (12/30 1529) Pulse Rate:  [76-83] 76 (12/30 1529) Resp:  [14-18] 18 (12/30 1529) BP: (153-179)/(97-102) 179/99 (12/30 1529) SpO2:  [99 %-100 %] 99 % (12/30 1529) Neurological exam Awake alert oriented x3 No evidence of aphasia or dysarthria Cranial nerves: Pupils equal round react light, extraocular movements intact, visual field full, facial sensation intact, face symmetric, auditory acuity intact, tongue and palate midline, shoulder shrug intact.  Tongue strength symmetric bilaterally. Motor exam: Upper extremities 5/5 with normal tone. Lower extremities-right lower extremity is 0/5 right ankle dorsiflexion, 4/5 plantarflexion, 3/5 inversion and eversion.  5/5 at the knee and the hip.  Left lower extremity is full strength. Sensory exam: Intact to light touch all over Cerebellar: No dysmetria. Gait testing deferred at this time.  Medications  Current Facility-Administered Medications:  .  acetaminophen (TYLENOL) tablet 650 mg, 650 mg, Oral, Q6H PRN **OR** acetaminophen (TYLENOL) suppository 650 mg, 650 mg, Rectal, Q6H PRN, Garba, Mohammad L, MD .  enoxaparin (LOVENOX) injection 40 mg, 40 mg, Subcutaneous, QHS, Garba, Mohammad L, MD, 40 mg at 03/26/19 2127 .  feeding supplement (ENSURE ENLIVE) (ENSURE ENLIVE) liquid 237 mL, 237 mL, Oral, BID BM, Lavina Hamman, MD, 237 mL at  03/27/19 1317 .  labetalol (NORMODYNE) injection 10 mg, 10 mg, Intravenous, Q2H PRN, Sheikh, Omair Latif, DO .  methylPREDNISolone sodium succinate (SOLU-MEDROL) 1,000 mg in sodium chloride 0.9 % 50 mL IVPB, 1,000 mg, Intravenous, Daily, Kerney Elbe, MD, Last Rate: 58 mL/hr at 03/27/19 0955, 1,000 mg at 03/27/19 0955 .  multivitamin with minerals tablet 1 tablet, 1 tablet, Oral, Daily, Lavina Hamman, MD, 1 tablet at 03/27/19 [id number] .  ondansetron (ZOFRAN) tablet 4 mg, 4 mg, Oral, Q6H PRN **OR** ondansetron (ZOFRAN) injection 4 mg, 4 mg, Intravenous, Q6H PRN, Elwyn Reach, MD Labs CBC    Component Value Date/Time   WBC 25.0 (H) 03/27/2019 0508   RBC 4.18 03/27/2019 0508   HGB 11.3 (L) 03/27/2019 0508   HCT 34.9 (L) 03/27/2019 0508   PLT 355 03/27/2019 0508   MCV 83.5 03/27/2019 0508   MCH 27.0 03/27/2019 0508   MCHC 32.4 03/27/2019 0508   RDW 17.4 (H) 03/27/2019 0508   LYMPHSABS 1.1 03/27/2019 0508   MONOABS 1.3 (H) 03/27/2019 0508   EOSABS 0.0 03/27/2019 0508   BASOSABS 0.0 03/27/2019 0508    CMP     Component Value Date/Time   NA 142 03/27/2019 0508   K 3.7 03/27/2019 0508   CL 109 03/27/2019 0508   CO2 25 03/27/2019 0508   GLUCOSE 122 (H) 03/27/2019 0508   BUN 10 03/27/2019 0508   CREATININE 0.60 03/27/2019 0508   CALCIUM 9.4 03/27/2019 0508   PROT 6.5 03/27/2019 0508   ALBUMIN 3.7 03/27/2019 0508   AST 15  03/27/2019 0508   ALT 16 03/27/2019 0508   ALKPHOS 41 03/27/2019 0508   BILITOT <0.1 (L) 03/27/2019 0508   GFRNONAA >60 03/27/2019 0508   GFRAA >60 03/27/2019 3295    Imaging I have reviewed images in epic and the results pertinent to this consultation are: MR brain with multiple lesions and faint enhancements consistent with active demyelinating disease. MRI of the L-spine with mild degenerative disease. MRI of the cervical thoracic spine shows pretty advanced demyelinating disease in the regions of the whole cervical spine up until the level of T1 with  most pronounced involvement in the mid cervical region with some cord swelling.  Area of abnormal contrast-enhancement most notable on the ventral cord on the right at C4.  Degenerative spondylosis at multiple levels as an official report as well.  Assessment:  33 year old woman past history only significant for gestational hypertension and an episode of right-sided numbness in 2008 with facial weakness being diagnosed with Bell's palsy at that time and some right upper extremity weakness diagnosed as carpal tunnel syndrome now presents with a right foot drop. Brain imaging suggestive of demyelinating disease.  Started on 5 days of IV albuterol prednisolone Cervical and thoracic spine imaging ordered to complete the work-up shows extensive demyelination in the cervical and upper thoracic cord. Diagnostic considerations include: Neuromyelitis optica, multiple sclerosis.  Recommendations: Continue for a total of 5 days of IV methylprednisolone Will need outpatient follow-up expedited Nedra Hai with Dr. Sherol Dade to initiate long-term disease modifying therapy. I have ordered an MO antibodies Continue aggressive PT OT  -- Milon Dikes, MD [facility] Pager: [phone] If 7pm to 7am, please call on call as listed on AMION.

## 2019-03-28 DIAGNOSIS — D649 Anemia, unspecified: Secondary | ICD-10-CM

## 2019-03-28 DIAGNOSIS — R739 Hyperglycemia, unspecified: Secondary | ICD-10-CM

## 2019-03-28 DIAGNOSIS — D509 Iron deficiency anemia, unspecified: Secondary | ICD-10-CM

## 2019-03-28 DIAGNOSIS — D72829 Elevated white blood cell count, unspecified: Secondary | ICD-10-CM

## 2019-03-28 LAB — CBC WITH DIFFERENTIAL/PLATELET
Abs Immature Granulocytes: 0.17 10*3/uL — ABNORMAL HIGH (ref 0.00–0.07)
Basophils Absolute: 0 10*3/uL (ref 0.0–0.1)
Basophils Relative: 0 %
Eosinophils Absolute: 0 10*3/uL (ref 0.0–0.5)
Eosinophils Relative: 0 %
HCT: 32.3 % — ABNORMAL LOW (ref 36.0–46.0)
Hemoglobin: 10.4 g/dL — ABNORMAL LOW (ref 12.0–15.0)
Immature Granulocytes: 1 %
Lymphocytes Relative: 3 %
Lymphs Abs: 0.8 10*3/uL (ref 0.7–4.0)
MCH: 27.2 pg (ref 26.0–34.0)
MCHC: 32.2 g/dL (ref 30.0–36.0)
MCV: 84.6 fL (ref 80.0–100.0)
Monocytes Absolute: 0.7 10*3/uL (ref 0.1–1.0)
Monocytes Relative: 3 %
Neutro Abs: 20.9 10*3/uL — ABNORMAL HIGH (ref 1.7–7.7)
Neutrophils Relative %: 93 %
Platelets: 334 10*3/uL (ref 150–400)
RBC: 3.82 MIL/uL — ABNORMAL LOW (ref 3.87–5.11)
RDW: 17.6 % — ABNORMAL HIGH (ref 11.5–15.5)
WBC: 22.5 10*3/uL — ABNORMAL HIGH (ref 4.0–10.5)
nRBC: 0 % (ref 0.0–0.2)

## 2019-03-28 LAB — COMPREHENSIVE METABOLIC PANEL
ALT: 20 U/L (ref 0–44)
AST: 16 U/L (ref 15–41)
Albumin: 3.5 g/dL (ref 3.5–5.0)
Alkaline Phosphatase: 39 U/L (ref 38–126)
Anion gap: 10 (ref 5–15)
BUN: 11 mg/dL (ref 6–20)
CO2: 24 mmol/L (ref 22–32)
Calcium: 9 mg/dL (ref 8.9–10.3)
Chloride: 108 mmol/L (ref 98–111)
Creatinine, Ser: 0.64 mg/dL (ref 0.44–1.00)
GFR calc Af Amer: >60 mL/min (ref >60)
GFR calc non Af Amer: >60 mL/min (ref >60)
Glucose, Bld: 135 mg/dL — ABNORMAL HIGH (ref 70–99)
Potassium: 3.6 mmol/L (ref 3.5–5.1)
Sodium: 142 mmol/L (ref 135–145)
Total Bilirubin: 0.5 mg/dL (ref 0.3–1.2)
Total Protein: 6.5 g/dL (ref 6.5–8.1)

## 2019-03-28 LAB — RETICULOCYTES
Immature Retic Fract: 12.2 % (ref 2.3–15.9)
RBC.: 3.84 MIL/uL — ABNORMAL LOW (ref 3.87–5.11)
Retic Count, Absolute: 51.5 10*3/uL (ref 19.0–186.0)
Retic Ct Pct: 1.3 % (ref 0.4–3.1)

## 2019-03-28 LAB — HEMOGLOBIN A1C
Hgb A1c MFr Bld: 5.5 % (ref 4.8–5.6)
Mean Plasma Glucose: 111.15 mg/dL

## 2019-03-28 LAB — IRON AND TIBC
Iron: 19 ug/dL — ABNORMAL LOW (ref 28–170)
Saturation Ratios: 5 % — ABNORMAL LOW (ref 10.4–31.8)
TIBC: 399 ug/dL (ref 250–450)
UIBC: 380 ug/dL

## 2019-03-28 LAB — FOLATE: Folate: 9.7 ng/mL (ref >5.9)

## 2019-03-28 LAB — FERRITIN: Ferritin: 4 ng/mL — ABNORMAL LOW (ref 11–307)

## 2019-03-28 LAB — PHOSPHORUS: Phosphorus: 2.7 mg/dL (ref 2.5–4.6)

## 2019-03-28 LAB — VITAMIN B12: Vitamin B-12: 297 pg/mL (ref 180–914)

## 2019-03-28 LAB — MAGNESIUM: Magnesium: 1.9 mg/dL (ref 1.7–2.4)

## 2019-03-28 MED ORDER — POLYSACCHARIDE IRON COMPLEX 150 MG PO CAPS
150.0000 mg | ORAL_CAPSULE | Freq: Every day | ORAL | Status: DC
  Administered 2019-03-28 – 2019-03-30 (×3): 150 mg via ORAL
  Filled 2019-03-28 (×3): qty 1

## 2019-03-28 NOTE — Evaluation (Signed)
Occupational Therapy Evaluation Patient Details Name: Alicia Rojas MRN: [id number] DOB: [birth year] Today's Date: 03/28/2019    History of Present Illness Pt is a 33 y/o female admitted secondary to R LE numbness and drop foot. MRI revealed extensive widespread findings of multiple sclerosis. No pertinent PMH.   Clinical Impression   Pt PTA: living independently and working. Pt performing own ADL in standing at sink and using bench with shower tasks. Pt independent with ADL tasks. Pt bending forward to fix socks and only focal deficit is the increased weakness of R foot. Pt ambulating in room with RW and focusing on touching heel first "like PT told me." Pt tolerating session well and not requiring any physical assist. Pt does not require continued OT skilled services. OT signing off.  ** AFO order has been put in, but it has not been delivered. Pt would benefit from R AFO prior to D/C.    Follow Up Recommendations  No OT follow up    Equipment Recommendations  None recommended by OT    Recommendations for Other Services       Precautions / Restrictions Precautions Precautions: Fall Restrictions Weight Bearing Restrictions: No      Mobility Bed Mobility Overal bed mobility: Independent                Transfers Overall transfer level: Independent Equipment used: None                  Balance Overall balance assessment: Modified Independent                                         ADL either performed or assessed with clinical judgement   ADL Overall ADL's : Needs assistance/impaired Eating/Feeding: Modified independent   Grooming: Modified independent   Upper Body Bathing: Modified independent   Lower Body Bathing: Modified independent   Upper Body Dressing : Modified independent   Lower Body Dressing: Modified independent   Toilet Transfer: Modified Independent   Toileting- Clothing Manipulation and Hygiene: Modified  independent       Functional mobility during ADLs: Modified independent General ADL Comments: Pt performing own ADL in standing at sink and using bench with shower. Pt beding forward to fix socks and only focal deficit is the increased weakness of R foot.     Vision Baseline Vision/History: No visual deficits Vision Assessment?: No apparent visual deficits     Perception     Praxis      Pertinent Vitals/Pain Pain Assessment: No/denies pain     Hand Dominance     Extremity/Trunk Assessment Upper Extremity Assessment Upper Extremity Assessment: Overall WFL for tasks assessed   Lower Extremity Assessment Lower Extremity Assessment: Generalized weakness   Cervical / Trunk Assessment Cervical / Trunk Assessment: Normal   Communication Communication Communication: No difficulties   Cognition Arousal/Alertness: Awake/alert Behavior During Therapy: WFL for tasks assessed/performed Overall Cognitive Status: Within Functional Limits for tasks assessed                                     General Comments       Exercises     Shoulder Instructions      Home Living Family/patient expects to be discharged to:: Private residence Living Arrangements: Children Available Help at Discharge: Family;Available 24 hours/day Type  of Home: House Home Access: Stairs to enter [employer] of Steps: 1   Home Layout: One level     Bathroom Shower/Tub: Chief Strategy Officer: Standard     Home Equipment: None          Prior Functioning/Environment Level of Independence: Independent        Comments: works at [employer]"        OT Problem List: Impaired balance (sitting and/or standing)      OT Treatment/Interventions:      OT Goals(Current goals can be found in the care plan section) Acute Rehab OT Goals Patient Stated Goal: "go home" OT Goal Formulation: With patient Time For Goal Achievement: 04/11/19 Potential to  Achieve Goals: Good  OT Frequency:     Barriers to D/C:            Co-evaluation              AM-PAC OT "6 Clicks" Daily Activity     Outcome Measure Help from another person eating meals?: None Help from another person taking care of personal grooming?: None Help from another person toileting, which includes using toliet, bedpan, or urinal?: None Help from another person bathing (including washing, rinsing, drying)?: None Help from another person to put on and taking off regular upper body clothing?: None Help from another person to put on and taking off regular lower body clothing?: None 6 Click Score: 24   End of Session Equipment Utilized During Treatment: Rolling walker Nurse Communication: Mobility status  Activity Tolerance: Patient tolerated treatment well Patient left: in chair;with call bell/phone within reach  OT Visit Diagnosis: Unsteadiness on feet (R26.81)                Time: 5397-6734 OT Time Calculation (min): 12 min Charges:  OT General Charges $OT Visit: 1 Visit OT Evaluation $OT Eval Low Complexity: 1 Low  Flora Lipps OTR/L Acute Rehabilitation Services Pager: [phone] Office: [phone]   Olla Delancey C 03/28/2019, 1:50 PM

## 2019-03-28 NOTE — Progress Notes (Signed)
NEUROLOGY PROGRESS NOTE  Subjective: Patient does not feel that her right foot drop has improved.  No other complaints  Exam: Vitals:   03/28/19 0455 03/28/19 0829  BP: (!) 155/86 (!) 171/107  Pulse: 79 76  Resp: 14 17  Temp: 98.7 F (37.1 C) 98.4 F (36.9 C)  SpO2: 100% 100%    ROS General ROS: negative for - chills, fatigue, fever, night sweats, weight gain or weight loss Psychological ROS: negative for - behavioral disorder, hallucinations, memory difficulties, mood swings or suicidal ideation Ophthalmic ROS: negative for - blurry vision, double vision, eye pain or loss of vision ENT ROS: negative for - epistaxis, nasal discharge, oral lesions, sore throat, tinnitus or vertigo Respiratory ROS: negative for - cough, hemoptysis, shortness of breath or wheezing Cardiovascular ROS: negative for - chest pain, dyspnea on exertion, edema or irregular heartbeat Gastrointestinal ROS: negative for - abdominal pain, diarrhea, hematemesis, nausea/vomiting or stool incontinence Genito-Urinary ROS: negative for - dysuria, hematuria, incontinence or urinary frequency/urgency Musculoskeletal ROS: Positive for -right foot drop Neurological ROS: as noted in HPI Dermatological ROS: negative for rash and skin lesion changes   Physical Exam  Constitutional: Appears well-developed and well-nourished.  Psych: Affect appropriate to situation Eyes: No scleral injection HENT: No OP obstrucion Head: Normocephalic.  Cardiovascular: Normal rate and regular rhythm.  Respiratory: Effort normal, non-labored breathing GI: Soft.  No distension. There is no tenderness.  Skin: WDI   Neuro:  Mental Status: Alert, oriented, thought content appropriate.  Speech fluent without evidence of aphasia.  Able to follow 3 step commands without difficulty. Cranial Nerves: II:  Visual fields grossly normal,  III,IV, VI: ptosis not present, extra-ocular motions intact bilaterally pupils equal, round, reactive to  light and accommodation V,VII: smile symmetric, facial light touch sensation normal bilaterally VIII: hearing normal bilaterally IX,X: Palate rises midline XI: bilateral shoulder shrug XII: midline tongue extension Motor: Right : Upper extremity   5/5    Left:     Upper extremity   5/5  Lower extremity   see note    lower extremity   5/5 -Patient has 5 out of 5 strength with the exception of right foot drop.  Patient has decreased range of motion as far as dorsi flexion.  She is able to dorsiflex her foot to approximately 30 degrees.  She is able to plantarflex her foot with good 5/5 strength.  Patient has weakness with eversion of right ankle and 5/5 strength with inversion Tone and bulk:normal tone throughout; no atrophy noted Sensory: Pinprick and light touch intact throughout, bilaterally Deep Tendon Reflexes: 2+ and symmetric with the exception of no ankle jerk Plantars: Right: downgoing   Left: downgoing Cerebellar: normal finger-to-nose  Medications:  Scheduled: . enoxaparin (LOVENOX) injection  40 mg Subcutaneous QHS  . feeding supplement (ENSURE ENLIVE)  237 mL Oral BID BM  . iron polysaccharides  150 mg Oral Daily  . multivitamin with minerals  1 tablet Oral Daily   Continuous: . methylPREDNISolone (SOLU-MEDROL) injection 1,000 mg (03/27/19 0955)    Pertinent Labs/Diagnostics: WBC 22.5 likely due to Solu-Medrol  MR CERVICAL SPINE W WO CONTRAST Result Date: 03/26/2019 . IMPRESSION: Advanced demyelinating disease in the region affecting from the cervicomedullary junction to the level of T1. Involvement most pronounced in the midcervical region with cord swelling. Areas of abnormal contrast enhancement, most notable in the ventral cord on the right at C4. Degenerative spondylosis also present, with spinal stenosis from C4-5 through C6-7, exacerbated by the cord swelling. Foraminal narrowing  that could cause neural compression bilaterally at C4-5, on the right at C5-6 and  bilateral at C6-7. Electronically Signed   By: Paulina Fusi M.D.   On: 03/26/2019 16:46   MR THORACIC SPINE W WO CONTRAST Result Date: 03/27/2019 . IMPRESSION: 1. Normal MRI appearance of the conus medullaris and nerve roots of the cauda equina. 2. Mild to moderate bilateral facet hypertrophy at L3-4 through L5-S1. Resultant mild-to-moderate right greater than left L5 foraminal stenosis. 3. No other significant degenerative changes within the lumbar spine. No other stenosis or neural impingement. Electronically Signed   By: Rise Mu M.D.   On: 03/27/2019 03:32   MR Lumbar Spine W Wo Contrast Result Date: 03/27/2019  IMPRESSION: 1. Normal MRI appearance of the conus medullaris and nerve roots of the cauda equina. 2. Mild to moderate bilateral facet hypertrophy at L3-4 through L5-S1. Resultant mild-to-moderate right greater than left L5 foraminal stenosis. 3. No other significant degenerative changes within the lumbar spine. No other stenosis or neural impingement. Electronically Signed   By: Rise Mu M.D.   On: 03/27/2019 03:32   -- Felicie Morn PA-C [facility] [phone]  Assessment:  33 year old female with MS exacerbation resulting in right foot drop.  Exam today continues to reveal right foot drop with no improvement per patient.  Inversion strength greater than eversion strength. Plantarflexion has greater strength than dorsiflexion.  MRI brain with contrast reveals extensive widespread findings of multiple sclerosis.  There are multiple white matter lesions of the cerebral hemispheres affecting the deep and subcortical white matter.  There are also multiple lesions found in the cervical cord.  MRI of thoracic and lumbar spine does not reveal any lesions. Patient has now received 2 doses of Solu-Medrol with third dose today.  Impression: -Right foot drop -Multiple sclerosis exacerbation with lesions in both brain and spinal cord -Evaluate for NMO-NMO  antibodies have been ordered  Recommendations: -Continue Solu-Medrol 1000 mg for 5 days total. -PT OT -We will need AFO on discharge -We will need follow-up with neurology Dr. Epimenio Foot of [facility] neurology on discharge  03/28/2019, 9:48 AM  -- Milon Dikes, MD [facility] Pager: [phone] If 7pm to 7am, please call on call as listed on AMION.

## 2019-03-28 NOTE — Progress Notes (Signed)
PROGRESS NOTE    Alicia Rojas  KCL:[id number] DOB: 31-Jul-1985 DOA: 03/25/2019 PCP: Patient, No Pcp Per   Brief Narrative: The patient Alicia Rojas a 33 y.o.femalewith medical history significant ofno significant past medical history except for PID who presented to the ER with 7 days of right foot weakness and numbness. Patient started experiencing the symptoms out of nowhere. She got out of bed and felt because she was unable to move her foot and toes. The anterior foot is gradually getting numb especially on the lateral side. She noted more swelling. She has been dragging her feet from time to time. Sensation is more like pins-and-needles. No prior history like that. Her only previous neurologic event was about 8 years ago when she had Bell's palsy. Her mother apparently had died of some neurologic diseases including MS. Patient was seen in the ER and evaluated. Work-up with MRI and now shows evidence of multiple sclerosis. Neurology evaluated patient and recommended admission and initiation of treatment and Currently further plan is continue steroids x5 days. (Today is day 3/5).  An ambulatory referral has been made for Dr. Despina Arias in the outpatient setting.  Neurology recommends continuing treatment course and wearing an AFO for the patient today  Assessment & Plan:   Principal Problem:   Multiple sclerosis (HCC) Active Problems:   Elevated blood pressure reading without diagnosis of hypertension   Hyperglycemia   Leukocytosis   Normocytic anemia   Iron deficiency anemia  Multiple Sclerosiswith a presentation of foot drop -New diagnosis.  -DG foot on the right side showed "There is no evidence of fracture or dislocation. There is no evidence of arthropathy or other focal bone abnormality. Soft tissues are unremarkable." -MRI Cervical Spine showed "Advanced demyelinating disease in the region affecting from the cervicomedullary junction to the level of T1.  Involvement most pronounced in the midcervical region with cord swelling. Areas of abnormal contrast enhancement, most notable in the ventral cord on the right at C4. Degenerative spondylosis also present, with spinal stenosis from C4-5 through C6-7, exacerbated by the cord swelling. Foraminal narrowing that could cause neural compression bilaterally at C4-5, on the right at C5-6 and bilateral at C6-7." -MRI Lumbar and Thoracic Spine showed "Normal MRI appearance of the conus medullaris and nerve roots of the cauda equina. Mild to moderate bilateral facet hypertrophy at L3-4 through L5-S1. Resultant mild-to-moderate right greater than left L5 foraminal stenosis. No other significant degenerative changes within the lumbar."  spine. No other stenosis or neural impingement -Started on IV Steroids 1000 mg of Methylprednisolone Daily x5 Days; currently today is day 3 of 5 -Neurology recommends the patient be started on Copaxone at D/C but will defer to their judgment -Neurology is also ordered MO antibodies -Neurology recommends an AFO boot for her foot drop and continuing aggressive physical therapy in outpatient setting is-PT OT recommending outpatient PT -Appreciate Any further care plan by Neurology and patient is to follow up with [facility] in the outpatient and an ambulatory referral has been made to Dr. Despina Arias  Elevated Blood Pressure -No prior diagnosis of hypertension.  -Will follow closely and initiate treatment of SBP>160 or DBP>100 with IV Labetalol 10 mg q2hprn  -BP this AM was 71/107 and likely in the setting of steroids -I have added labetalol 10 mg for systolic blood pressure greater than 160 or diastolic blood pressure greater than 100 -Continue to Monitor BP per Protocol   Hyperglycemia -In the setting of IV Steroid Demargination -Blood Sugar  was elevated and was 122 on CMP this AM -Check HbA1c to r/o Diabetes Mellitus when she was 5.5 so likely her hyperglycemia is in  the setting of steroids -Continue to Monitor and Trend Blood Sugars carefully and if necessary will place on Sensitive Novolog SSI AC   Leukocytosis -In the setting of IV Steroid Demargination -WBC went from 8.0 -> 25.0; now it is improving is 22.5 -Continue to Monitor for S/Sx of Infection -Repeat CBCin AM   Normocytic Anemia/Iron Deficiency Anemia  -Patient's hemoglobin/hematocrit has been relatively stable and yesterday was 11.3/34.9 and today it is 10.4/32.3 -Checked an Anemia Panel and showed an iron level of 19, U IBC of 380, TIBC 399, saturation ratios of 5%, ferritin level of 4, folate level 9.7, and vitamin B12 level of 297 -We will start the patient on Niferex 150 mg p.o. daily -Continue to monitor for signs and symptoms of bleeding; currently no overt bleeding noted but she did have greater than 50 RBCs per high-power field in her urinalysis -Repeat CBC in a.m.  DVT prophylaxis: Enoxaparin 40 mg sq q24h Code Status: FULL CODE  Family Communication: No family present at bedside  Disposition Plan: Pending Further Neurology Recc's and PT/OT Evaluation and completion of her steroids  Consultants:   Neurology Dr. Judyann Munson. Amie [place]   Procedures: MRI's as above   Antimicrobials:  Anti-infectives (From admission, onward)   None     Subjective: Seen and examined at bedside and states that her foot was not doing much better and complained of being "heavy" and states it is sometimes "burning".  Worked with physical therapy and been practicing her exercises.  States her IV is better after it was changed.  No nausea or vomiting.  No other concerns or complaints at this time  Objective: Vitals:   03/27/19 2037 03/28/19 0146 03/28/19 0455 03/28/19 0829  BP: (!) 169/100 (!) 152/97 (!) 155/86 (!) 171/107  Pulse: 76 83 79 76  Resp: 16 14 14 17   Temp: 98.1 F (36.7 C) 98.5 F (36.9 C) 98.7 F (37.1 C) 98.4 F (36.9 C)  TempSrc: Oral Oral Oral Oral  SpO2: 97% 100% 100%  100%  Weight:      Height:        Intake/Output Summary (Last 24 hours) at 03/28/2019 1046 Last data filed at 03/28/2019 0500 Gross per 24 hour  Intake 1040 ml  Output 4 ml  Net 1036 ml   Filed Weights   03/25/19 1025  Weight: 59 kg   Examination: Physical Exam:  Constitutional: WN/WD thin African-American female currently in NAD and appears calm Eyes: Lids and conjunctivae normal, sclerae anicteric  ENMT: External Ears, Nose appear normal. Grossly normal hearing.  Neck: Appears normal, supple, no cervical masses, normal ROM, no appreciable thyromegaly; no JVD Respiratory: Clear to auscultation bilaterally, no wheezing, rales, rhonchi or crackles. Normal respiratory effort and patient is not tachypenic. No accessory muscle use.  Cardiovascular: RRR, no murmurs / rubs / gallops. S1 and S2 auscultated. Mild Right Foot extremity edema.  Abdomen: Soft, non-tender, non-distended. Bowel sounds positive x4.  GU: Deferred. Musculoskeletal: No clubbing / cyanosis of digits/nails. No joint deformity upper and lower extremities.  Skin: No rashes, lesions, ulcers on a limited skin evaluation. No induration; Warm and dry.  Neurologic: CN 2-12 grossly intact with no focal deficits.  Decreased strength in her right foot and has trouble with some dorsiflexion.  Foot is inverted. Psychiatric: Normal judgment and insight. Alert and oriented x 3. Normal mood and  appropriate affect.    Data Reviewed: I have personally reviewed following labs and imaging studies  CBC: Recent Labs  Lab 03/25/19 1055 03/26/19 0311 03/27/19 0508 03/28/19 0209  WBC 7.8 8.0 25.0* 22.5*  NEUTROABS 5.3  --  22.5* 20.9*  HGB 11.3* 11.5* 11.3* 10.4*  HCT 35.8* 35.5* 34.9* 32.3*  MCV 85.9 84.5 83.5 84.6  PLT 303 321 355 334   Basic Metabolic Panel: Recent Labs  Lab 03/25/19 1055 03/26/19 0311 03/27/19 0508 03/28/19 0209  NA 142 139 142 142  K 3.6 3.7 3.7 3.6  CL 108 106 109 108  CO2 26 25 25 24   GLUCOSE  99 119* 122* 135*  BUN 7 7 10 11   CREATININE 0.57 0.59 0.60 0.64  CALCIUM 9.2 8.9 9.4 9.0  MG  --   --  1.8 1.9  PHOS  --   --   --  2.7   GFR: Estimated Creatinine Clearance: 81.8 mL/min (by C-G formula based on SCr of 0.64 mg/dL). Liver Function Tests: Recent Labs  Lab 03/26/19 0311 03/27/19 0508 03/28/19 0209  AST 18 15 16   ALT 18 16 20   ALKPHOS 46 41 39  BILITOT 0.4 <0.1* 0.5  PROT 6.5 6.5 6.5  ALBUMIN 3.7 3.7 3.5   No results for input(s): LIPASE, AMYLASE in the last 168 hours. No results for input(s): AMMONIA in the last 168 hours. Coagulation Profile: No results for input(s): INR, PROTIME in the last 168 hours. Cardiac Enzymes: No results for input(s): CKTOTAL, CKMB, CKMBINDEX, TROPONINI in the last 168 hours. BNP (last 3 results) No results for input(s): PROBNP in the last 8760 hours. HbA1C: Recent Labs    03/28/19 0209  HGBA1C 5.5   CBG: No results for input(s): GLUCAP in the last 168 hours. Lipid Profile: No results for input(s): CHOL, HDL, LDLCALC, TRIG, CHOLHDL, LDLDIRECT in the last 72 hours. Thyroid Function Tests: No results for input(s): TSH, T4TOTAL, FREET4, T3FREE, THYROIDAB in the last 72 hours. Anemia Panel: Recent Labs    03/26/19 0311 03/28/19 0209  VITAMINB12 296 297  FOLATE  --  9.7  FERRITIN  --  4*  TIBC  --  399  IRON  --  19*  RETICCTPCT  --  1.3   Sepsis Labs: No results for input(s): PROCALCITON, LATICACIDVEN in the last 168 hours.  Recent Results (from the past 240 hour(s))  SARS CORONAVIRUS 2 (TAT 6-24 HRS) Nasopharyngeal Nasopharyngeal Swab     Status: None   Collection Time: 03/25/19 10:55 AM   Specimen: Nasopharyngeal Swab  Result Value Ref Range Status   SARS Coronavirus 2 NEGATIVE NEGATIVE Final    Comment: (NOTE) SARS-CoV-2 target nucleic acids are NOT DETECTED. The SARS-CoV-2 RNA is generally detectable in upper and lower respiratory specimens during the acute phase of infection. Negative results do not preclude  SARS-CoV-2 infection, do not rule out co-infections with other pathogens, and should not be used as the sole basis for treatment or other patient management decisions. Negative results must be combined with clinical observations, patient history, and epidemiological information. The expected result is Negative. Fact Sheet for Patients: HairSlick.nohttps://www.fda.gov/media/138098/download Fact Sheet for Healthcare Providers: quierodirigir.comhttps://www.fda.gov/media/138095/download This test is not yet approved or cleared by the [place] FDA and  has been authorized for detection and/or diagnosis of SARS-CoV-2 by FDA under an Emergency Use Authorization (EUA). This EUA will remain  in effect (meaning this test can be used) for the duration of the COVID-19 declaration under Section 56 4(b)(1) of the Act, 21  U.S.C. section 360bbb-3(b)(1), unless the authorization is terminated or revoked sooner. Performed at [facility], [address], [place], [place] [address]   Urine culture     Status: Abnormal   Collection Time: 03/25/19  6:12 PM   Specimen: Urine, Random  Result Value Ref Range Status   Specimen Description URINE, RANDOM  Final   Special Requests NONE  Final   Culture (A)  Final    <10,000 COLONIES/mL INSIGNIFICANT GROWTH Performed at [facility], [address], [place], [place] [address]    Report Status 03/26/2019 FINAL  Final    RN Pressure Injury Documentation:     Estimated body mass index is 24.56 kg/m as calculated from the following:   Height as of this encounter:  (1.549 m).   Weight as of this encounter: 59 kg.  Malnutrition Type:  Nutrition Problem: Increased nutrient needs Etiology: chronic illness   Malnutrition Characteristics:  Signs/Symptoms: estimated needs   Nutrition Interventions:  Interventions: Ensure Enlive (each supplement provides 350kcal and 20 grams of protein), MVI   Radiology Studies: MR CERVICAL SPINE W WO CONTRAST  Result  Date: 03/26/2019 CLINICAL DATA:  Seven day history of right foot weakness and numbness. Brain findings consistent with multiple sclerosis. EXAM: MRI CERVICAL SPINE WITHOUT AND WITH CONTRAST TECHNIQUE: Multiplanar and multiecho pulse sequences of the cervical spine, to include the craniocervical junction and cervicothoracic junction, were obtained without and with intravenous contrast. CONTRAST:  6mL GADAVIST GADOBUTROL 1 MMOL/ML IV SOLN COMPARISON:  MRI brain done yesterday. FINDINGS: Alignment: Straightening of the normal cervical lordosis. Vertebrae: Discogenic endplate marrow changes at C4-5 which could be associated with neck pain. Cord: Widespread demyelinating disease throughout the cervical region from the cervicomedullary junction to the T1 level. Multiple discrete and indistinct MS plaques throughout the region. Cord swelling particularly in the region from C3 through C6. After contrast administration, there are areas of abnormal enhancement, most pronounced at the ventral right cord at the C4 level. Posterior Fossa, vertebral arteries, paraspinal tissues: No soft tissue neck lesion seen. See results of brain MRI. Disc levels: C3-4: Disc bulge. Mild bilateral foraminal narrowing without apparent neural compression. C4-5: Spondylosis with endplate osteophytes and protruding disc material. Spinal stenosis with AP diameter in the midline as narrow as 8.9 mm. Effacement of the subarachnoid space. Bilateral foraminal stenosis could affect either C5 nerve. C5-6: Spondylosis with endplate osteophytes and protrusion of the disc more prominent towards the right. Spinal stenosis with AP diameter in the midline as narrow as 8.4 mm. Right foraminal stenosis likely to compress the right C6 nerve. C6-7: Spondylosis with endplate osteophytes and shallow protrusion of the disc. Spinal stenosis with AP diameter in the midline as narrow as 8.7 mm. Mild bilateral foraminal narrowing that could possibly affect either C7  nerve. C7-T1: Facet osteoarthritis. Mild bulging of the disc. No compressive stenosis. IMPRESSION: Advanced demyelinating disease in the region affecting from the cervicomedullary junction to the level of T1. Involvement most pronounced in the midcervical region with cord swelling. Areas of abnormal contrast enhancement, most notable in the ventral cord on the right at C4. Degenerative spondylosis also present, with spinal stenosis from C4-5 through C6-7, exacerbated by the cord swelling. Foraminal narrowing that could cause neural compression bilaterally at C4-5, on the right at C5-6 and bilateral at C6-7. Electronically Signed   By: Paulina Fusi M.D.   On: 03/26/2019 16:46   MR THORACIC SPINE W WO CONTRAST  Result Date: 03/27/2019 CLINICAL DATA:  Initial  evaluation for demyelinating disease, back pain. EXAM: MRI LUMBAR SPINE WITHOUT AND WITH CONTRAST TECHNIQUE: Multiplanar and multiecho pulse sequences of the lumbar spine were obtained without and with intravenous contrast. CONTRAST:  6mL GADAVIST GADOBUTROL 1 MMOL/ML IV SOLN COMPARISON:  Comparison made with prior MRIs of the head and cervical spine from 03/25/2019 and 03/26/2019. FINDINGS: Segmentation: Standard. Lowest well-formed disc space labeled the L5-S1 level. Alignment: Trace scoliosis. Alignment otherwise normal with preservation of the normal lumbar lordosis. No listhesis. Vertebrae: Vertebral body height maintained without evidence for acute or chronic fracture. Bone marrow signal intensity within normal limits. No discrete or worrisome osseous lesions. No abnormal marrow edema or enhancement. Conus medullaris and cauda equina: Conus extends to the L1 level. Conus and cauda equina appear normal. Paraspinal and other soft tissues: Paraspinous soft tissues grossly within normal limits on this motion degraded exam. Visualized visceral structures grossly unremarkable. Disc levels: No significant disc pathology seen within the lumbar spine.  Intervertebral discs are well hydrated with preserved disc height. No disc bulge or focal disc protrusion. Mild to moderate bilateral facet hypertrophy at L3-4 through L5-S1. No significant spinal stenosis. Mild to moderate bilateral L5 foraminal narrowing, right worse than left due to facet degeneration. IMPRESSION: 1. Normal MRI appearance of the conus medullaris and nerve roots of the cauda equina. 2. Mild to moderate bilateral facet hypertrophy at L3-4 through L5-S1. Resultant mild-to-moderate right greater than left L5 foraminal stenosis. 3. No other significant degenerative changes within the lumbar spine. No other stenosis or neural impingement. Electronically Signed   By: Rise MuBenjamin  McClintock M.D.   On: 03/27/2019 03:32   MR Lumbar Spine W Wo Contrast  Result Date: 03/27/2019 CLINICAL DATA:  Initial evaluation for demyelinating disease, back pain. EXAM: MRI LUMBAR SPINE WITHOUT AND WITH CONTRAST TECHNIQUE: Multiplanar and multiecho pulse sequences of the lumbar spine were obtained without and with intravenous contrast. CONTRAST:  6mL GADAVIST GADOBUTROL 1 MMOL/ML IV SOLN COMPARISON:  Comparison made with prior MRIs of the head and cervical spine from 03/25/2019 and 03/26/2019. FINDINGS: Segmentation: Standard. Lowest well-formed disc space labeled the L5-S1 level. Alignment: Trace scoliosis. Alignment otherwise normal with preservation of the normal lumbar lordosis. No listhesis. Vertebrae: Vertebral body height maintained without evidence for acute or chronic fracture. Bone marrow signal intensity within normal limits. No discrete or worrisome osseous lesions. No abnormal marrow edema or enhancement. Conus medullaris and cauda equina: Conus extends to the L1 level. Conus and cauda equina appear normal. Paraspinal and other soft tissues: Paraspinous soft tissues grossly within normal limits on this motion degraded exam. Visualized visceral structures grossly unremarkable. Disc levels: No significant  disc pathology seen within the lumbar spine. Intervertebral discs are well hydrated with preserved disc height. No disc bulge or focal disc protrusion. Mild to moderate bilateral facet hypertrophy at L3-4 through L5-S1. No significant spinal stenosis. Mild to moderate bilateral L5 foraminal narrowing, right worse than left due to facet degeneration. IMPRESSION: 1. Normal MRI appearance of the conus medullaris and nerve roots of the cauda equina. 2. Mild to moderate bilateral facet hypertrophy at L3-4 through L5-S1. Resultant mild-to-moderate right greater than left L5 foraminal stenosis. 3. No other significant degenerative changes within the lumbar spine. No other stenosis or neural impingement. Electronically Signed   By: Rise MuBenjamin  McClintock M.D.   On: 03/27/2019 03:32   Scheduled Meds:  enoxaparin (LOVENOX) injection  40 mg Subcutaneous QHS   feeding supplement (ENSURE ENLIVE)  237 mL Oral BID BM   iron polysaccharides  150 mg  Oral Daily   multivitamin with minerals  1 tablet Oral Daily   Continuous Infusions:  methylPREDNISolone (SOLU-MEDROL) injection 1,000 mg (03/28/19 1004)    LOS: 3 days   Merlene Laughter, DO [facility] PAGER is on AMION  If 7PM-7AM, please contact night-coverage [email or web address]

## 2019-03-29 LAB — COMPREHENSIVE METABOLIC PANEL
ALT: 27 U/L (ref 0–44)
AST: 18 U/L (ref 15–41)
Albumin: 3.4 g/dL — ABNORMAL LOW (ref 3.5–5.0)
Alkaline Phosphatase: 43 U/L (ref 38–126)
Anion gap: 9 (ref 5–15)
BUN: 13 mg/dL (ref 6–20)
CO2: 27 mmol/L (ref 22–32)
Calcium: 9.1 mg/dL (ref 8.9–10.3)
Chloride: 105 mmol/L (ref 98–111)
Creatinine, Ser: 0.62 mg/dL (ref 0.44–1.00)
GFR calc Af Amer: >60 mL/min (ref >60)
GFR calc non Af Amer: >60 mL/min (ref >60)
Glucose, Bld: 116 mg/dL — ABNORMAL HIGH (ref 70–99)
Potassium: 4.2 mmol/L (ref 3.5–5.1)
Sodium: 141 mmol/L (ref 135–145)
Total Bilirubin: 0.3 mg/dL (ref 0.3–1.2)
Total Protein: 6.2 g/dL — ABNORMAL LOW (ref 6.5–8.1)

## 2019-03-29 LAB — CBC WITH DIFFERENTIAL/PLATELET
Abs Immature Granulocytes: 0.19 10*3/uL — ABNORMAL HIGH (ref 0.00–0.07)
Basophils Absolute: 0 10*3/uL (ref 0.0–0.1)
Basophils Relative: 0 %
Eosinophils Absolute: 0 10*3/uL (ref 0.0–0.5)
Eosinophils Relative: 0 %
HCT: 32.1 % — ABNORMAL LOW (ref 36.0–46.0)
Hemoglobin: 10.3 g/dL — ABNORMAL LOW (ref 12.0–15.0)
Immature Granulocytes: 1 %
Lymphocytes Relative: 5 %
Lymphs Abs: 1.1 10*3/uL (ref 0.7–4.0)
MCH: 27.2 pg (ref 26.0–34.0)
MCHC: 32.1 g/dL (ref 30.0–36.0)
MCV: 84.7 fL (ref 80.0–100.0)
Monocytes Absolute: 1.2 10*3/uL — ABNORMAL HIGH (ref 0.1–1.0)
Monocytes Relative: 6 %
Neutro Abs: 17.7 10*3/uL — ABNORMAL HIGH (ref 1.7–7.7)
Neutrophils Relative %: 88 %
Platelets: 322 10*3/uL (ref 150–400)
RBC: 3.79 MIL/uL — ABNORMAL LOW (ref 3.87–5.11)
RDW: 17.4 % — ABNORMAL HIGH (ref 11.5–15.5)
WBC: 20.2 10*3/uL — ABNORMAL HIGH (ref 4.0–10.5)
nRBC: 0 % (ref 0.0–0.2)

## 2019-03-29 LAB — PHOSPHORUS: Phosphorus: 3.7 mg/dL (ref 2.5–4.6)

## 2019-03-29 LAB — MAGNESIUM: Magnesium: 2 mg/dL (ref 1.7–2.4)

## 2019-03-29 MED ORDER — AMLODIPINE BESYLATE 5 MG PO TABS
5.0000 mg | ORAL_TABLET | Freq: Every day | ORAL | Status: DC
  Administered 2019-03-29 – 2019-03-30 (×2): 5 mg via ORAL
  Filled 2019-03-29 (×2): qty 1

## 2019-03-29 NOTE — Plan of Care (Signed)

## 2019-03-29 NOTE — Plan of Care (Signed)
  Problem: Clinical Measurements: Goal: Ability to maintain clinical measurements within normal limits will improve Outcome: Progressing   Problem: Safety: Goal: Ability to remain free from injury will improve Outcome: Progressing   Problem: Clinical Measurements: Goal: Ability to maintain clinical measurements within normal limits will improve Outcome: Progressing

## 2019-03-29 NOTE — Plan of Care (Signed)
  Problem: Education: Goal: Knowledge of General Education information will improve Description: Including pain rating scale, medication(s)/side effects and non-pharmacologic comfort measures Outcome: Progressing   Problem: Health Behavior/Discharge Planning: Goal: Ability to manage health-related needs will improve Outcome: Progressing   Problem: Clinical Measurements: Goal: Will remain free from infection Outcome: Progressing   Problem: Activity: Goal: Risk for activity intolerance will decrease Outcome: Progressing   Problem: Nutrition: Goal: Adequate nutrition will be maintained Outcome: Progressing   Problem: Pain Managment: Goal: General experience of comfort will improve Outcome: Progressing   Problem: Safety: Goal: Ability to remain free from injury will improve Outcome: Progressing   Problem: Skin Integrity: Goal: Risk for impaired skin integrity will decrease Outcome: Progressing   

## 2019-03-29 NOTE — Progress Notes (Signed)
PROGRESS NOTE    SHAQUASHA GERSTEL  ONG:[id number] DOB: 08/30/1985 DOA: 03/25/2019 PCP: Patient, No Pcp Per   Brief Narrative: The patient Kami Kube Josephis a 34 y.o.femalewith medical history significant ofno significant past medical history except for PID who presented to the ER with 7 days of right foot weakness and numbness. Patient started experiencing the symptoms out of nowhere. She got out of bed and felt because she was unable to move her foot and toes. The anterior foot is gradually getting numb especially on the lateral side. She noted more swelling. She has been dragging her feet from time to time. Sensation is more like pins-and-needles. No prior history like that. Her only previous neurologic event was about 8 years ago when she had Bell's palsy. Her mother apparently had died of some neurologic diseases including MS. Patient was seen in the ER and evaluated. Work-up with MRI and now shows evidence of multiple sclerosis. Neurology evaluated patient and recommended admission and initiation of treatment and Currently further plan is continue steroids x5 days. (Today is day 4/5).  An ambulatory referral has been made for Dr. Despina Arias in the outpatient setting.  Neurology recommends continuing treatment course and wearing an AFO when ambulating. AFO has been ordered.   Assessment & Plan:   Principal Problem:   Multiple sclerosis (HCC) Active Problems:   Elevated blood pressure reading without diagnosis of hypertension   Hyperglycemia   Leukocytosis   Normocytic anemia   Iron deficiency anemia  Multiple Sclerosiswith a presentation of foot drop -New diagnosis.  -DG foot on the right side showed "There is no evidence of fracture or dislocation. There is no evidence of arthropathy or other focal bone abnormality. Soft tissues are unremarkable." -MRI Cervical Spine showed "Advanced demyelinating disease in the region affecting from the cervicomedullary junction to  the level of T1. Involvement most pronounced in the midcervical region with cord swelling. Areas of abnormal contrast enhancement, most notable in the ventral cord on the right at C4. Degenerative spondylosis also present, with spinal stenosis from C4-5 through C6-7, exacerbated by the cord swelling. Foraminal narrowing that could cause neural compression bilaterally at C4-5, on the right at C5-6 and bilateral at C6-7." -MRI Lumbar and Thoracic Spine showed "Normal MRI appearance of the conus medullaris and nerve roots of the cauda equina. Mild to moderate bilateral facet hypertrophy at L3-4 through L5-S1. Resultant mild-to-moderate right greater than left L5 foraminal stenosis. No other significant degenerative changes within the lumbar."  spine. No other stenosis or neural impingement -Started on IV Steroids 1000 mg of Methylprednisolone Daily x5 Days; currently today is day 4 of 5 -Neurology recommends the patient be started on Copaxone at D/C but will defer to their judgment -Neurology is also ordered NMO antibodies -Neurology recommends an AFO boot for her foot drop and continuing aggressive physical therapy in outpatient setting is-PT OT recommending outpatient PT -Appreciate Any further care plan by Neurology and patient is to follow up with [facility] in the outpatient and an ambulatory referral has been made to Dr. Despina Arias -Feels as if her Foot is a little better today   Elevated Blood Pressure -No prior diagnosis of hypertension.  -Will follow closely and initiate treatment of SBP>160 or DBP>100 with IV Labetalol 10 mg q2hprn  -Will add Amlodipine 5 mg po Daily  -BP this AM was 172/109 and likely in the setting of steroids -I have added labetalol 10 mg for systolic blood pressure greater than 160 or diastolic blood  pressure greater than 100 -Continue to Monitor BP per Protocol   Hyperglycemia -In the setting of IV Steroid Demargination -Blood Sugar was elevated and was 122 on  CMP this AM -Check HbA1c to r/o Diabetes Mellitus when she was 5.5 so likely her hyperglycemia is in the setting of steroids -Continue to Monitor and Trend Blood Sugars carefully and if necessary will place on Sensitive Novolog SSI AC   Leukocytosis -In the setting of IV Steroid Demargination -WBC went from 8.0 -> 25.0; now it is improving is 20.2 -Continue to Monitor for S/Sx of Infection -Repeat CBCin AM   Normocytic Anemia/Iron Deficiency Anemia  -Patient's hemoglobin/hematocrit has been relatively stable and today is 10.3/32.1 -Checked an Anemia Panel and showed an iron level of 19, U IBC of 380, TIBC 399, saturation ratios of 5%, ferritin level of 4, folate level 9.7, and vitamin B12 level of 297 -We will start the patient on Niferex 150 mg p.o. daily -Continue to monitor for signs and symptoms of bleeding; currently no overt bleeding noted but she did have greater than 50 RBCs per high-power field in her urinalysis -Repeat CBC in a.m.  DVT prophylaxis: Enoxaparin 40 mg sq q24h Code Status: FULL CODE  Family Communication: No family present at bedside  Disposition Plan: Pending Further Neurology Recc's and PT/OT Evaluation and completion of her steroids  Consultants:   Neurology Dr. Pearletha Forge. Milon Dikes   Procedures: MRI's as above   Antimicrobials:  Anti-infectives (From admission, onward)   None     Subjective: Seen and examined at bedside and thinks that her foot is doing little bit better today and thinks it is less swollen and has a little bit more movement.  No nausea or vomiting.  Had no complaints or concerns overnight.  Feels well today.  Objective: Vitals:   03/28/19 1657 03/28/19 1950 03/29/19 0415 03/29/19 0839  BP: (!) 153/95 (!) 158/96 (!) 158/98 (!) 172/109  Pulse: 91 90 75 75  Resp: 17 15 16 18   Temp: 98.2 F (36.8 C) 98.3 F (36.8 C) 97.9 F (36.6 C) 98.4 F (36.9 C)  TempSrc: Oral Oral Oral Oral  SpO2: 100% 99% 100% 99%  Weight:       Height:        Intake/Output Summary (Last 24 hours) at 03/29/2019 1248 Last data filed at 03/28/2019 2112 Gross per 24 hour  Intake 480 ml  Output -  Net 480 ml   Filed Weights   03/25/19 1025  Weight: 59 kg   Examination: Physical Exam:  Constitutional: WN/WD African-American female currently in no acute distress appears calm Eyes:  Lids and conjunctivae normal, sclerae anicteric  ENMT: External Ears, Nose appear normal. Grossly normal hearing. Mucous membranes are moist.  Neck: Appears normal, supple, no cervical masses, normal ROM, no appreciable thyromegaly; no JVD Respiratory: Clear to auscultation bilaterally, no wheezing, rales, rhonchi or crackles. Normal respiratory effort and patient is not tachypenic. No accessory muscle use.  Cardiovascular: RRR, no murmurs / rubs / gallops. S1 and S2 auscultated. No extremity edema. Abdomen: Soft, non-tender, non-distended. Bowel sounds positive x4.  GU: Deferred. Musculoskeletal: No clubbing / cyanosis of digits/nails. No joint deformity upper and lower extremities.  Skin: No rashes, lesions, ulcers on a limited skin evaluation. No induration; Warm and dry.  Neurologic: CN 2-12 grossly intact with no focal deficits.  Continues to have some decreased sensation in her right foot and decreased rate in her dorsiflexion  Psychiatric: Normal judgment and insight. Alert and oriented x  3. Normal mood and appropriate affect.   Data Reviewed: I have personally reviewed following labs and imaging studies  CBC: Recent Labs  Lab 03/25/19 1055 03/26/19 0311 03/27/19 0508 03/28/19 0209 03/29/19 0325  WBC 7.8 8.0 25.0* 22.5* 20.2*  NEUTROABS 5.3  --  22.5* 20.9* 17.7*  HGB 11.3* 11.5* 11.3* 10.4* 10.3*  HCT 35.8* 35.5* 34.9* 32.3* 32.1*  MCV 85.9 84.5 83.5 84.6 84.7  PLT 303 321 355 334 322   Basic Metabolic Panel: Recent Labs  Lab 03/25/19 1055 03/26/19 0311 03/27/19 0508 03/28/19 0209 03/29/19 0325  NA 142 139 142 142 141  K  3.6 3.7 3.7 3.6 4.2  CL 108 106 109 108 105  CO2 26 25 25 24 27   GLUCOSE 99 119* 122* 135* 116*  BUN 7 7 10 11 13   CREATININE 0.57 0.59 0.60 0.64 0.62  CALCIUM 9.2 8.9 9.4 9.0 9.1  MG  --   --  1.8 1.9 2.0  PHOS  --   --   --  2.7 3.7   GFR: Estimated Creatinine Clearance: 81.8 mL/min (by C-G formula based on SCr of 0.62 mg/dL). Liver Function Tests: Recent Labs  Lab 03/26/19 0311 03/27/19 0508 03/28/19 0209 03/29/19 0325  AST 18 15 16 18   ALT 18 16 20 27   ALKPHOS 46 41 39 43  BILITOT 0.4 <0.1* 0.5 0.3  PROT 6.5 6.5 6.5 6.2*  ALBUMIN 3.7 3.7 3.5 3.4*   No results for input(s): LIPASE, AMYLASE in the last 168 hours. No results for input(s): AMMONIA in the last 168 hours. Coagulation Profile: No results for input(s): INR, PROTIME in the last 168 hours. Cardiac Enzymes: No results for input(s): CKTOTAL, CKMB, CKMBINDEX, TROPONINI in the last 168 hours. BNP (last 3 results) No results for input(s): PROBNP in the last 8760 hours. HbA1C: Recent Labs    03/28/19 0209  HGBA1C 5.5   CBG: No results for input(s): GLUCAP in the last 168 hours. Lipid Profile: No results for input(s): CHOL, HDL, LDLCALC, TRIG, CHOLHDL, LDLDIRECT in the last 72 hours. Thyroid Function Tests: No results for input(s): TSH, T4TOTAL, FREET4, T3FREE, THYROIDAB in the last 72 hours. Anemia Panel: Recent Labs    03/28/19 0209  VITAMINB12 297  FOLATE 9.7  FERRITIN 4*  TIBC 399  IRON 19*  RETICCTPCT 1.3   Sepsis Labs: No results for input(s): PROCALCITON, LATICACIDVEN in the last 168 hours.  Recent Results (from the past 240 hour(s))  SARS CORONAVIRUS 2 (TAT 6-24 HRS) Nasopharyngeal Nasopharyngeal Swab     Status: None   Collection Time: 03/25/19 10:55 AM   Specimen: Nasopharyngeal Swab  Result Value Ref Range Status   SARS Coronavirus 2 NEGATIVE NEGATIVE Final    Comment: (NOTE) SARS-CoV-2 target nucleic acids are NOT DETECTED. The SARS-CoV-2 RNA is generally detectable in upper and  lower respiratory specimens during the acute phase of infection. Negative results do not preclude SARS-CoV-2 infection, do not rule out co-infections with other pathogens, and should not be used as the sole basis for treatment or other patient management decisions. Negative results must be combined with clinical observations, patient history, and epidemiological information. The expected result is Negative. Fact Sheet for Patients: Fact Sheet for Healthcare Providers: 03/30/19 This test is not yet approved or cleared by the 03/30/19 FDA and  has been authorized for detection and/or diagnosis of SARS-CoV-2 by FDA under an Emergency Use Authorization (EUA). This EUA will remain  in effect (meaning this test can be used) for the  duration of the COVID-19 declaration under Section 56 4(b)(1) of the Act, 21 U.S.C. section 360bbb-3(b)(1), unless the authorization is terminated or revoked sooner. Performed at [facility], [place] [address] [place], [place] [address]   Urine culture     Status: Abnormal   Collection Time: 03/25/19  6:12 PM   Specimen: Urine, Random  Result Value Ref Range Status   Specimen Description URINE, RANDOM  Final   Special Requests NONE  Final   Culture (A)  Final    <10,000 COLONIES/mL INSIGNIFICANT GROWTH Performed at [facility], [place] [address], [place],  [address]    Report Status 03/26/2019 FINAL  Final    RN Pressure Injury Documentation:     Estimated body mass index is 24.56 kg/m as calculated from the following:   Height as of this encounter: 5\' 1"  (1.549 m).   Weight as of this encounter: 59 kg.  Malnutrition Type:  Nutrition Problem: Increased nutrient needs Etiology: chronic illness   Malnutrition Characteristics:  Signs/Symptoms: estimated needs   Nutrition Interventions:  Interventions: Ensure Enlive (each supplement provides 350kcal  and 20 grams of protein), MVI   Radiology Studies: No results found. Scheduled Meds: . enoxaparin (LOVENOX) injection  40 mg Subcutaneous QHS  . feeding supplement (ENSURE ENLIVE)  237 mL Oral BID BM  . iron polysaccharides  150 mg Oral Daily  . multivitamin with minerals  1 tablet Oral Daily   Continuous Infusions:   LOS: 4 days   Kerney Elbe, DO [facility] PAGER is on [place]  If 7PM-7AM, please contact night-coverage [email or web address]

## 2019-03-30 LAB — COMPREHENSIVE METABOLIC PANEL
ALT: 33 U/L (ref 0–44)
AST: 21 U/L (ref 15–41)
Albumin: 3.3 g/dL — ABNORMAL LOW (ref 3.5–5.0)
Alkaline Phosphatase: 48 U/L (ref 38–126)
Anion gap: 13 (ref 5–15)
BUN: 13 mg/dL (ref 6–20)
CO2: 25 mmol/L (ref 22–32)
Calcium: 8.8 mg/dL — ABNORMAL LOW (ref 8.9–10.3)
Chloride: 100 mmol/L (ref 98–111)
Creatinine, Ser: 0.58 mg/dL (ref 0.44–1.00)
GFR calc Af Amer: >60 mL/min (ref >60)
GFR calc non Af Amer: >60 mL/min (ref >60)
Glucose, Bld: 126 mg/dL — ABNORMAL HIGH (ref 70–99)
Potassium: 3.8 mmol/L (ref 3.5–5.1)
Sodium: 138 mmol/L (ref 135–145)
Total Bilirubin: 0.3 mg/dL (ref 0.3–1.2)
Total Protein: 5.8 g/dL — ABNORMAL LOW (ref 6.5–8.1)

## 2019-03-30 LAB — CBC WITH DIFFERENTIAL/PLATELET
Abs Immature Granulocytes: 0.22 10*3/uL — ABNORMAL HIGH (ref 0.00–0.07)
Basophils Absolute: 0 10*3/uL (ref 0.0–0.1)
Basophils Relative: 0 %
Eosinophils Absolute: 0 10*3/uL (ref 0.0–0.5)
Eosinophils Relative: 0 %
HCT: 32.6 % — ABNORMAL LOW (ref 36.0–46.0)
Hemoglobin: 10.5 g/dL — ABNORMAL LOW (ref 12.0–15.0)
Immature Granulocytes: 1 %
Lymphocytes Relative: 6 %
Lymphs Abs: 1.2 10*3/uL (ref 0.7–4.0)
MCH: 26.9 pg (ref 26.0–34.0)
MCHC: 32.2 g/dL (ref 30.0–36.0)
MCV: 83.6 fL (ref 80.0–100.0)
Monocytes Absolute: 1 10*3/uL (ref 0.1–1.0)
Monocytes Relative: 5 %
Neutro Abs: 18.3 10*3/uL — ABNORMAL HIGH (ref 1.7–7.7)
Neutrophils Relative %: 88 %
Platelets: 331 10*3/uL (ref 150–400)
RBC: 3.9 MIL/uL (ref 3.87–5.11)
RDW: 17.2 % — ABNORMAL HIGH (ref 11.5–15.5)
WBC: 20.7 10*3/uL — ABNORMAL HIGH (ref 4.0–10.5)
nRBC: 0 % (ref 0.0–0.2)

## 2019-03-30 LAB — MAGNESIUM: Magnesium: 2 mg/dL (ref 1.7–2.4)

## 2019-03-30 LAB — PHOSPHORUS: Phosphorus: 3.9 mg/dL (ref 2.5–4.6)

## 2019-03-30 MED ORDER — AMLODIPINE BESYLATE 5 MG PO TABS: 5.0000 mg | ORAL_TABLET | Freq: Every day | ORAL | 0 refills | Status: DC

## 2019-03-30 MED ORDER — POLYSACCHARIDE IRON COMPLEX 150 MG PO CAPS: 150.0000 mg | ORAL_CAPSULE | Freq: Every day | ORAL | 0 refills | Status: DC

## 2019-03-30 MED ORDER — ONDANSETRON HCL 4 MG PO TABS: 4.0000 mg | ORAL_TABLET | Freq: Four times a day (QID) | ORAL | 0 refills | Status: DC | PRN

## 2019-03-30 MED ORDER — ENSURE ENLIVE PO LIQD: 237.0000 mL | Freq: Two times a day (BID) | ORAL | 12 refills | Status: DC

## 2019-03-30 MED ORDER — ADULT MULTIVITAMIN W/MINERALS CH: 1.0000 | ORAL_TABLET | Freq: Every day | ORAL | 0 refills | Status: DC

## 2019-03-30 NOTE — Plan of Care (Signed)

## 2019-03-30 NOTE — Progress Notes (Signed)
Pt received rolling walker and ortho boot for home use, pt was made aware of d/c plans and called ride. Ride is on their way to the hospital. All questions answered to pt satisfaction. Jahmeir Geisen Mena Goes, RN 03/30/2019 2:45 PM

## 2019-03-30 NOTE — Discharge Summary (Signed)
Physician Discharge Summary  Alicia Rojas ZOX:[id number] DOB: Aug 17, 1985 DOA: 03/25/2019  PCP: Patient, No Pcp Per  Admit date: 03/25/2019 Discharge date: 03/30/2019  Admitted From: Home Disposition: Home with Outpatient PT/OT  Recommendations for Outpatient Follow-up:  1. Follow up and establish with PCP in 1-2 weeks 2. Follow up with Neurology Dr. Epimenio Foot within 1 week;  3. Please obtain CMP/CBC, Mag Phos in one week 4. Please follow up on the following pending results: NMO Antibodies   Home Health: No Equipment/Devices: AFO; Rolling Walker  Discharge Condition: Stable CODE STATUS: FULL CODE  Diet recommendation: Heart Healthy Diet  Brief/Interim Summary: The patient Alicia N Josephis a 34 y.o.femalewith medical history significant ofno significant past medical history except for PID who presented to the ER with 7 days of right foot weakness and numbness. Patient started experiencing the symptoms out of nowhere. She got out of bed and felt because she was unable to move her foot and toes. The anterior foot is gradually getting numb especially on the lateral side. She noted more swelling. She has been dragging her feet from time to time. Sensation is more like pins-and-needles. No prior history like that. Her only previous neurologic event was about 8 years ago when she had Bell's palsy. Her mother apparently had died of some neurologic diseases including MS. Patient was seen in the ER and evaluated. Work-up with MRI and now shows evidence of multiple sclerosis. Neurology evaluated patient and recommended admission and initiation of treatment and Currently further plan iscontinue steroids x5 days. (Today is day5/5).  An ambulatory referral has been made for Dr. Despina Arias in the outpatient setting as well as outpatient PT and OT.  Neurology recommends continuing treatment course and wearing an AFO when ambulating. AFO has been ordered.  Patient was deemed stable for  discharge and she will need to follow-up with PCP as well as Neurology.    Discharge Diagnoses:  Principal Problem:   Multiple sclerosis (HCC) Active Problems:   Elevated blood pressure reading without diagnosis of hypertension   Hyperglycemia   Leukocytosis   Normocytic anemia   Iron deficiency anemia   Multiple Sclerosiswith a presentation of foot drop -New diagnosis.  -DG foot on the right side showed "There is no evidence of fracture or dislocation. There is no evidence of arthropathy or other focal bone abnormality. Soft tissues are unremarkable." -MRI Cervical Spine showed "Advanced demyelinating disease in the region affecting from the cervicomedullary junction to the level of T1. Involvement most pronounced in the midcervical region with cord swelling. Areas of abnormal contrast enhancement, most notable in the ventral cord on the right at C4. Degenerative spondylosis also present, with spinal stenosis from C4-5 through C6-7, exacerbated by the cord swelling. Foraminal narrowing that could cause neural compression bilaterally at C4-5, on the right at C5-6 and bilateral at C6-7." -MRI Lumbar and Thoracic Spine showed "Normal MRI appearance of the conus medullaris and nerve roots of the cauda equina. Mild to moderate bilateral facet hypertrophy at L3-4 through L5-S1. Resultant mild-to-moderate right greater than left L5 foraminal stenosis. No other significant degenerative changes within the lumbar."  spine. No other stenosis or neural impingement -Started on IV Steroids 1000 mg of Methylprednisolone Daily x5 Days; currently today is day 5 of 5 and complete  -Neurology recommends the patient be started on Copaxone at D/C but will defer to their judgment -Neurology is also ordered NMO antibodies -Neurology recommends an AFO boot for her foot drop and continuing aggressive physical therapy in  outpatient setting is-PT OT recommending outpatient PT -Appreciate Any further care plan by  Neurology and patient is to follow up with [facility] in the outpatient and an ambulatory referral has been made to Dr. Despina Arias -Feels as if her Foot is a little better today  and stable for discharge  Elevated Blood Pressure -No prior diagnosis of hypertension but appears to be continuously hypertensive so will initiate blood pressure management -Will follow closely and initiate treatment of SBP>160 or DBP>100 with IV Labetalol 10 mg q2hprn  -Will add Amlodipine 5 mg po Daily  have PCP further adjust -BP this AM was 172/109 and likely in the setting of steroids -I have added labetalol 10 mg for systolic blood pressure greater than 160 or diastolic blood pressure greater than 100 -Continue to Monitor BP per Protocol   Hyperglycemia -In the setting of IV Steroid Demargination -Blood Sugar was elevated and was 122 on CMP this AM -Check HbA1c to r/o Diabetes Mellitus when she was 5.5 so likely her hyperglycemia is in the setting of steroids -Continue to Monitor and Trend Blood Sugars carefully and if necessary will place on Sensitive Novolog SSI AC   Leukocytosis -In the setting of IV Steroid Demargination -WBC went from 8.0 -> 25.0; now it is improving is 20.7 -Continue to Monitor for S/Sx of Infection -Repeat CBCin AM   Normocytic Anemia/Iron Deficiency Anemia  -Patient's hemoglobin/hematocrit has been relatively stable and today is 10.5/32.6 -Checked an Anemia Panel and showed an iron level of 19, U IBC of 380, TIBC 399, saturation ratios of 5%, ferritin level of 4, folate level 9.7, and vitamin B12 level of 297 -We will start the patient on Niferex 150 mg p.o. daily -Continue to monitor for signs and symptoms of bleeding; currently no overt bleeding noted but she did have greater than 50 RBCs per high-power field in her urinalysis -Repeat CBC in a.m.  Discharge Instructions  Discharge Instructions    Ambulatory referral to Neurology   Complete by: As directed    An  appointment is requested in approximately: Within 1 week. Has newly diagnosed MS   Ambulatory referral to Occupational Therapy   Complete by: As directed    Ambulatory referral to Physical Therapy   Complete by: As directed      Allergies as of 03/30/2019   No Known Allergies     Medication List    TAKE these medications   amLODipine 5 MG tablet Commonly known as: NORVASC Take 1 tablet (5 mg total) by mouth daily.   feeding supplement (ENSURE ENLIVE) Liqd Take 237 mLs by mouth 2 (two) times daily between meals.   iron polysaccharides 150 MG capsule Commonly known as: NIFEREX Take 1 capsule (150 mg total) by mouth daily.   multivitamin with minerals Tabs tablet Take 1 tablet by mouth daily.   ondansetron 4 MG tablet Commonly known as: ZOFRAN Take 1 tablet (4 mg total) by mouth every 6 (six) hours as needed for nausea.       No Known Allergies  Consultations:  Neurology  Procedures/Studies: MR Brain W and Wo Contrast  Result Date: 03/25/2019 CLINICAL DATA:  Foot numbness and weakness. Question multiple sclerosis. Symptoms began 5-7 days ago. EXAM: MRI HEAD WITHOUT AND WITH CONTRAST TECHNIQUE: Multiplanar, multiecho pulse sequences of the brain and surrounding structures were obtained without and with intravenous contrast. CONTRAST:  5 cc Gadavist COMPARISON:  Head CT 02/01/2013 FINDINGS: Brain: Diffusion imaging does not show any acute or subacute infarction or other  cause of restricted diffusion. There are multiple foci of abnormal T2 and FLAIR signal with involvement of the upper cervical spinal cord, middle cerebellar peduncles, and extensive involvement throughout the deep and subcortical white matter of both cerebral hemispheres. The lesions are typical of multiple sclerosis lesions. No sign of ischemic infarction. No mass lesion, hemorrhage, hydrocephalus or extra-axial collection. Low level enhancement is seen only along the margins of a white matter lesion in the  forceps major area on the left. Vascular: Major vessels at the base of the brain show flow. Skull and upper cervical spine: Negative Sinuses/Orbits: Clear/normal Other: None IMPRESSION: Extensive widespread findings of multiple sclerosis. Multiple white matter lesions of the cerebral hemispheres affecting the deep and subcortical white matter. More subtle lesions affecting the cerebellum in the middle cerebellar peduncle regions and affecting the upper cervical spinal cord as visualized. No lesions show restricted diffusion. Low level contrast enhancement along the margins of a white matter lesion in the left forceps major region. Electronically Signed   By: Paulina Fusi M.D.   On: 03/25/2019 16:40   MR CERVICAL SPINE W WO CONTRAST  Result Date: 03/26/2019 CLINICAL DATA:  Seven day history of right foot weakness and numbness. Brain findings consistent with multiple sclerosis. EXAM: MRI CERVICAL SPINE WITHOUT AND WITH CONTRAST TECHNIQUE: Multiplanar and multiecho pulse sequences of the cervical spine, to include the craniocervical junction and cervicothoracic junction, were obtained without and with intravenous contrast. CONTRAST:  6mL GADAVIST GADOBUTROL 1 MMOL/ML IV SOLN COMPARISON:  MRI brain done yesterday. FINDINGS: Alignment: Straightening of the normal cervical lordosis. Vertebrae: Discogenic endplate marrow changes at C4-5 which could be associated with neck pain. Cord: Widespread demyelinating disease throughout the cervical region from the cervicomedullary junction to the T1 level. Multiple discrete and indistinct MS plaques throughout the region. Cord swelling particularly in the region from C3 through C6. After contrast administration, there are areas of abnormal enhancement, most pronounced at the ventral right cord at the C4 level. Posterior Fossa, vertebral arteries, paraspinal tissues: No soft tissue neck lesion seen. See results of brain MRI. Disc levels: C3-4: Disc bulge. Mild bilateral  foraminal narrowing without apparent neural compression. C4-5: Spondylosis with endplate osteophytes and protruding disc material. Spinal stenosis with AP diameter in the midline as narrow as 8.9 mm. Effacement of the subarachnoid space. Bilateral foraminal stenosis could affect either C5 nerve. C5-6: Spondylosis with endplate osteophytes and protrusion of the disc more prominent towards the right. Spinal stenosis with AP diameter in the midline as narrow as 8.4 mm. Right foraminal stenosis likely to compress the right C6 nerve. C6-7: Spondylosis with endplate osteophytes and shallow protrusion of the disc. Spinal stenosis with AP diameter in the midline as narrow as 8.7 mm. Mild bilateral foraminal narrowing that could possibly affect either C7 nerve. C7-T1: Facet osteoarthritis. Mild bulging of the disc. No compressive stenosis. IMPRESSION: Advanced demyelinating disease in the region affecting from the cervicomedullary junction to the level of T1. Involvement most pronounced in the midcervical region with cord swelling. Areas of abnormal contrast enhancement, most notable in the ventral cord on the right at C4. Degenerative spondylosis also present, with spinal stenosis from C4-5 through C6-7, exacerbated by the cord swelling. Foraminal narrowing that could cause neural compression bilaterally at C4-5, on the right at C5-6 and bilateral at C6-7. Electronically Signed   By: Paulina Fusi M.D.   On: 03/26/2019 16:46   MR THORACIC SPINE W WO CONTRAST  Result Date: 03/27/2019 CLINICAL DATA:  Initial evaluation for demyelinating  disease, back pain. EXAM: MRI LUMBAR SPINE WITHOUT AND WITH CONTRAST TECHNIQUE: Multiplanar and multiecho pulse sequences of the lumbar spine were obtained without and with intravenous contrast. CONTRAST:  6mL GADAVIST GADOBUTROL 1 MMOL/ML IV SOLN COMPARISON:  Comparison made with prior MRIs of the head and cervical spine from 03/25/2019 and 03/26/2019. FINDINGS: Segmentation: Standard.  Lowest well-formed disc space labeled the L5-S1 level. Alignment: Trace scoliosis. Alignment otherwise normal with preservation of the normal lumbar lordosis. No listhesis. Vertebrae: Vertebral body height maintained without evidence for acute or chronic fracture. Bone marrow signal intensity within normal limits. No discrete or worrisome osseous lesions. No abnormal marrow edema or enhancement. Conus medullaris and cauda equina: Conus extends to the L1 level. Conus and cauda equina appear normal. Paraspinal and other soft tissues: Paraspinous soft tissues grossly within normal limits on this motion degraded exam. Visualized visceral structures grossly unremarkable. Disc levels: No significant disc pathology seen within the lumbar spine. Intervertebral discs are well hydrated with preserved disc height. No disc bulge or focal disc protrusion. Mild to moderate bilateral facet hypertrophy at L3-4 through L5-S1. No significant spinal stenosis. Mild to moderate bilateral L5 foraminal narrowing, right worse than left due to facet degeneration. IMPRESSION: 1. Normal MRI appearance of the conus medullaris and nerve roots of the cauda equina. 2. Mild to moderate bilateral facet hypertrophy at L3-4 through L5-S1. Resultant mild-to-moderate right greater than left L5 foraminal stenosis. 3. No other significant degenerative changes within the lumbar spine. No other stenosis or neural impingement. Electronically Signed   By: Rise Mu M.D.   On: 03/27/2019 03:32   MR Lumbar Spine W Wo Contrast  Result Date: 03/27/2019 CLINICAL DATA:  Initial evaluation for demyelinating disease, back pain. EXAM: MRI LUMBAR SPINE WITHOUT AND WITH CONTRAST TECHNIQUE: Multiplanar and multiecho pulse sequences of the lumbar spine were obtained without and with intravenous contrast. CONTRAST:  6mL GADAVIST GADOBUTROL 1 MMOL/ML IV SOLN COMPARISON:  Comparison made with prior MRIs of the head and cervical spine from 03/25/2019 and  03/26/2019. FINDINGS: Segmentation: Standard. Lowest well-formed disc space labeled the L5-S1 level. Alignment: Trace scoliosis. Alignment otherwise normal with preservation of the normal lumbar lordosis. No listhesis. Vertebrae: Vertebral body height maintained without evidence for acute or chronic fracture. Bone marrow signal intensity within normal limits. No discrete or worrisome osseous lesions. No abnormal marrow edema or enhancement. Conus medullaris and cauda equina: Conus extends to the L1 level. Conus and cauda equina appear normal. Paraspinal and other soft tissues: Paraspinous soft tissues grossly within normal limits on this motion degraded exam. Visualized visceral structures grossly unremarkable. Disc levels: No significant disc pathology seen within the lumbar spine. Intervertebral discs are well hydrated with preserved disc height. No disc bulge or focal disc protrusion. Mild to moderate bilateral facet hypertrophy at L3-4 through L5-S1. No significant spinal stenosis. Mild to moderate bilateral L5 foraminal narrowing, right worse than left due to facet degeneration. IMPRESSION: 1. Normal MRI appearance of the conus medullaris and nerve roots of the cauda equina. 2. Mild to moderate bilateral facet hypertrophy at L3-4 through L5-S1. Resultant mild-to-moderate right greater than left L5 foraminal stenosis. 3. No other significant degenerative changes within the lumbar spine. No other stenosis or neural impingement. Electronically Signed   By: Rise Mu M.D.   On: 03/27/2019 03:32   DG Chest Portable 1 View  Result Date: 03/25/2019 CLINICAL DATA:  Steroid therapy with concern for pneumonia EXAM: PORTABLE CHEST 1 VIEW COMPARISON:  February 18, 2009 FINDINGS: Lungs are clear. Heart  size and pulmonary vascularity are within normal limits. No adenopathy. No bone lesions. IMPRESSION: No edema or consolidation.  No evident adenopathy. Electronically Signed   By: Lowella Grip III M.D.    On: 03/25/2019 18:55   DG Foot Complete Right  Result Date: 03/25/2019 CLINICAL DATA:  Numbness of the right foot over the last week. Unable to move the foot. EXAM: RIGHT FOOT COMPLETE - 3+ VIEW COMPARISON:  None. FINDINGS: There is no evidence of fracture or dislocation. There is no evidence of arthropathy or other focal bone abnormality. Soft tissues are unremarkable. IMPRESSION: Negative. Electronically Signed   By: Nelson Chimes M.D.   On: 03/25/2019 11:01   DG Foot Complete Right  Result Date: 03/24/2019 CLINICAL DATA:  Numbness and heaviness and right foot. EXAM: RIGHT FOOT COMPLETE - 3+ VIEW COMPARISON:  None. FINDINGS: Mild hallux valgus deformity without evidence of an acute fracture, subluxation, or dislocation. No worrisome lytic or sclerotic osseous abnormality. IMPRESSION: Negative. Electronically Signed   By: Misty Stanley M.D.   On: 03/24/2019 14:47    Subjective: Seen and examined at bedside and she was doing well.  Denies any complaints or concerns and happy to go home.  No nausea or vomiting.  Thinks.  Doing little bit better.  No other concerns and understands to follow-up with neurology closely.  Discharge Exam: Vitals:   03/30/19 0247 03/30/19 0825  BP: (!) 150/97 (!) 162/105  Pulse: 81 76  Resp:  17  Temp: 98.3 F (36.8 C) 98.3 F (36.8 C)  SpO2: 100% 96%   Vitals:   03/29/19 1926 03/29/19 2331 03/30/19 0247 03/30/19 0825  BP: (!) 171/102 (!) 141/76 (!) 150/97 (!) 162/105  Pulse: 76 82 81 76  Resp:    17  Temp: 98.6 F (37 C)  98.3 F (36.8 C) 98.3 F (36.8 C)  TempSrc: Oral  Oral Oral  SpO2: 100%  100% 96%  Weight:      Height:       General: Pt is alert, awake, not in acute distress Cardiovascular: RRR, S1/S2 +, no rubs, no gallops Respiratory: CTA bilaterally, no wheezing, no rhonchi Abdominal: Soft, NT, ND, bowel sounds + Extremities: Trace edema, no cyanosis  The results of significant diagnostics from this hospitalization (including imaging,  microbiology, ancillary and laboratory) are listed below for reference.    Microbiology: Recent Results (from the past 240 hour(s))  SARS CORONAVIRUS 2 (TAT 6-24 HRS) Nasopharyngeal Nasopharyngeal Swab     Status: None   Collection Time: 03/25/19 10:55 AM   Specimen: Nasopharyngeal Swab  Result Value Ref Range Status   SARS Coronavirus 2 NEGATIVE NEGATIVE Final    Comment: (NOTE) SARS-CoV-2 target nucleic acids are NOT DETECTED. The SARS-CoV-2 RNA is generally detectable in upper and lower respiratory specimens during the acute phase of infection. Negative results do not preclude SARS-CoV-2 infection, do not rule out co-infections with other pathogens, and should not be used as the sole basis for treatment or other patient management decisions. Negative results must be combined with clinical observations, patient history, and epidemiological information. The expected result is Negative. Fact Sheet for Patients: SugarRoll.be Fact Sheet for Healthcare Providers: https://www.woods-mathews.com/ This test is not yet approved or cleared by the [place] FDA and  has been authorized for detection and/or diagnosis of SARS-CoV-2 by FDA under an Emergency Use Authorization (EUA). This EUA will remain  in effect (meaning this test can be used) for the duration of the COVID-19 declaration under Section 56 4(b)(1) of  the Act, 21 U.S.C. section 360bbb-3(b)(1), unless the authorization is terminated or revoked sooner. Performed at [facility], [address], [place], [place] [address]   Urine culture     Status: Abnormal   Collection Time: 03/25/19  6:12 PM   Specimen: Urine, Random  Result Value Ref Range Status   Specimen Description URINE, RANDOM  Final   Special Requests NONE  Final   Culture (A)  Final    <10,000 COLONIES/mL INSIGNIFICANT GROWTH Performed at [facility], [address] [place], [place] [address]    Report  Status 03/26/2019 FINAL  Final    Labs: BNP (last 3 results) No results for input(s): BNP in the last 8760 hours. Basic Metabolic Panel: Recent Labs  Lab 03/26/19 0311 03/27/19 0508 03/28/19 0209 03/29/19 0325 03/30/19 0401  NA 139 142 142 141 138  K 3.7 3.7 3.6 4.2 3.8  CL 106 109 108 105 100  CO2 25 25 24 27 25   GLUCOSE 119* 122* 135* 116* 126*  BUN 7 10 11 13 13   CREATININE 0.59 0.60 0.64 0.62 0.58  CALCIUM 8.9 9.4 9.0 9.1 8.8*  MG  --  1.8 1.9 2.0 2.0  PHOS  --   --  2.7 3.7 3.9   Liver Function Tests: Recent Labs  Lab 03/26/19 0311 03/27/19 0508 03/28/19 0209 03/29/19 0325 03/30/19 0401  AST 18 15 16 18 21   ALT 18 16 20 27  33  ALKPHOS 46 41 39 43 48  BILITOT 0.4 <0.1* 0.5 0.3 0.3  PROT 6.5 6.5 6.5 6.2* 5.8*  ALBUMIN 3.7 3.7 3.5 3.4* 3.3*   No results for input(s): LIPASE, AMYLASE in the last 168 hours. No results for input(s): AMMONIA in the last 168 hours. CBC: Recent Labs  Lab 03/25/19 1055 03/26/19 0311 03/27/19 0508 03/28/19 0209 03/29/19 0325 03/30/19 0401  WBC 7.8 8.0 25.0* 22.5* 20.2* 20.7*  NEUTROABS 5.3  --  22.5* 20.9* 17.7* 18.3*  HGB 11.3* 11.5* 11.3* 10.4* 10.3* 10.5*  HCT 35.8* 35.5* 34.9* 32.3* 32.1* 32.6*  MCV 85.9 84.5 83.5 84.6 84.7 83.6  PLT 303 321 355 334 322 331   Cardiac Enzymes: No results for input(s): CKTOTAL, CKMB, CKMBINDEX, TROPONINI in the last 168 hours. BNP: Invalid input(s): POCBNP CBG: No results for input(s): GLUCAP in the last 168 hours. D-Dimer No results for input(s): DDIMER in the last 72 hours. Hgb A1c Recent Labs    03/28/19 0209  HGBA1C 5.5   Lipid Profile No results for input(s): CHOL, HDL, LDLCALC, TRIG, CHOLHDL, LDLDIRECT in the last 72 hours. Thyroid function studies No results for input(s): TSH, T4TOTAL, T3FREE, THYROIDAB in the last 72 hours.  Invalid input(s): FREET3 Anemia work up Recent Labs    03/28/19 0209  VITAMINB12 297  FOLATE 9.7  FERRITIN 4*  TIBC 399  IRON 19*   RETICCTPCT 1.3   Urinalysis    Component Value Date/Time   COLORURINE YELLOW 03/25/2019 1812   APPEARANCEUR CLEAR 03/25/2019 1812   LABSPEC 1.027 03/25/2019 1812   PHURINE 6.0 03/25/2019 1812   GLUCOSEU NEGATIVE 03/25/2019 1812   HGBUR LARGE (A) 03/25/2019 1812   BILIRUBINUR NEGATIVE 03/25/2019 1812   KETONESUR 5 (A) 03/25/2019 1812   PROTEINUR NEGATIVE 03/25/2019 1812   UROBILINOGEN 1.0 11/13/2013 1612   NITRITE NEGATIVE 03/25/2019 1812   LEUKOCYTESUR NEGATIVE 03/25/2019 1812   Sepsis Labs Invalid input(s): PROCALCITONIN,  WBC,  LACTICIDVEN Microbiology Recent Results (from the past 240 hour(s))  SARS CORONAVIRUS 2 (TAT 6-24  HRS) Nasopharyngeal Nasopharyngeal Swab     Status: None   Collection Time: 03/25/19 10:55 AM   Specimen: Nasopharyngeal Swab  Result Value Ref Range Status   SARS Coronavirus 2 NEGATIVE NEGATIVE Final    Comment: (NOTE) SARS-CoV-2 target nucleic acids are NOT DETECTED. The SARS-CoV-2 RNA is generally detectable in upper and lower respiratory specimens during the acute phase of infection. Negative results do not preclude SARS-CoV-2 infection, do not rule out co-infections with other pathogens, and should not be used as the sole basis for treatment or other patient management decisions. Negative results must be combined with clinical observations, patient history, and epidemiological information. The expected result is Negative. Fact Sheet for Patients: HairSlick.no Fact Sheet for Healthcare Providers: quierodirigir.com This test is not yet approved or cleared by the [place] FDA and  has been authorized for detection and/or diagnosis of SARS-CoV-2 by FDA under an Emergency Use Authorization (EUA). This EUA will remain  in effect (meaning this test can be used) for the duration of the COVID-19 declaration under Section 56 4(b)(1) of the Act, 21 U.S.C. section 360bbb-3(b)(1), unless the  authorization is terminated or revoked sooner. Performed at [facility], [address], [place], [place] [address]   Urine culture     Status: Abnormal   Collection Time: 03/25/19  6:12 PM   Specimen: Urine, Random  Result Value Ref Range Status   Specimen Description URINE, RANDOM  Final   Special Requests NONE  Final   Culture (A)  Final    <10,000 COLONIES/mL INSIGNIFICANT GROWTH Performed at [facility], [address] [place], [place] [address]    Report Status 03/26/2019 FINAL  Final   Time coordinating discharge: 35 minutes  SIGNED:  Merlene Laughter, DO [facility] 03/30/2019, 11:23 AM Pager is on AMION  If 7PM-7AM, please contact night-coverage [email or web address] Password [email or web address]

## 2019-03-30 NOTE — Care Management (Addendum)
Patient given resources for local clinics for PCP. Patient will need to call to make appointment Monday-Friday.    Outpatient referrals for PT/OT entered by MD.    RW ordered to be delivered to room prior to d/c.

## 2019-03-30 NOTE — Plan of Care (Signed)
From a neurological standpoint, Dr. Bonnita Hollow office has been contacted and will call the patient to schedule her for outpatient visit in the middle of next week.  She can be discharged home per PT/OT recs after completion of 5d of IVMP, when medically OK per primary team.  As for DMT, decision will be made after seeing Dr. Epimenio Foot.   Copaxone is likely not going to be a good choice given her aggressive disease and she might need newer, more aggressive DMT for her disease.   NMO Ab pending at the time of this dictation.  -- Milon Dikes, MD [facility] Pager: [phone] If 7pm to 7am, please call on call as listed on AMION.

## 2019-03-30 NOTE — Progress Notes (Signed)
AVS reviewed with pt, all questions answered.Iv has been removed and Pt belongings gathered and pt dressed. Ride has arrived and pt is to be wheeled down.  Sheralee Qazi Mena Goes, RN 03/30/2019 3:41 PM

## 2019-04-01 ENCOUNTER — Telehealth: Payer: Self-pay | Admitting: *Deleted

## 2019-04-01 LAB — NEUROMYELITIS OPTICA AUTOAB, IGG: NMO-IgG: <1.5 U/mL (ref 0.0–3.0)

## 2019-04-01 NOTE — Telephone Encounter (Signed)
Called pt. Scheduled appt for 04/03/19 at 130pm with Dr. Epimenio Foot.

## 2019-04-03 ENCOUNTER — Other Ambulatory Visit: Payer: Self-pay

## 2019-04-03 ENCOUNTER — Encounter: Payer: Self-pay | Admitting: Neurology

## 2019-04-03 ENCOUNTER — Telehealth: Payer: Self-pay | Admitting: *Deleted

## 2019-04-03 ENCOUNTER — Ambulatory Visit (INDEPENDENT_AMBULATORY_CARE_PROVIDER_SITE_OTHER): Payer: 59 | Admitting: Neurology

## 2019-04-03 VITALS — BP 139/80 | HR 93 | Temp 97.8°F | Ht 62.0 in | Wt 135.8 lb

## 2019-04-03 DIAGNOSIS — R200 Anesthesia of skin: Secondary | ICD-10-CM

## 2019-04-03 DIAGNOSIS — G35 Multiple sclerosis: Secondary | ICD-10-CM

## 2019-04-03 DIAGNOSIS — Z79899 Other long term (current) drug therapy: Secondary | ICD-10-CM | POA: Diagnosis not present

## 2019-04-03 DIAGNOSIS — M21371 Foot drop, right foot: Secondary | ICD-10-CM | POA: Insufficient documentation

## 2019-04-03 HISTORY — DX: Anesthesia of skin: R20.0

## 2019-04-03 NOTE — Progress Notes (Addendum)
[facility]  PATIENT: Alicia Rojas DOB: Apr 06, 1985  REFERRING DOCTOR OR PCP: Dr. Beatrix Fetters Aroor SOURCE: Patient, notes from recent hospitalization, imaging and laboratory reports, MRI images personally reviewed.  _________________________________   HISTORICAL  CHIEF COMPLAINT:  Chief Complaint  Patient presents with  . New Patient (Initial Visit)    RM 13, alone. ER referral for MS. Was at [facility] 03/25/19-03/30/19. Received 5 days IV steroids. Currently has right foot drop and wearing brace given by hospital but she states her walking ability has declined.  Has trouble with fine motor skills. Has numbness in right hand and right foot. No falls per pt.     HISTORY OF PRESENT ILLNESS:  I had the pleasure of seeing your patient, Alicia Rojas, at [facility] for neurologic consultation regarding her recent diagnosis of relapsing remitting multiple sclerosis.  She is a 34 year old woman who noted difficulty with picking up her right leg as she walked around Christmas 2020.   She was stumbling but had no falls.  She also noted swelling and pain in the right ankle.   She had numbness in the right foot.   She did not note any symptoms on the left side of her body but there was also a little bit of clumsiness in the right hand.  She presented to the emergency room at [facility].  MRI was consistent with MS and she was admitted for IV steroids.     Currently, she notes continued reduced gait.   She was fitted for a right foot brace and that has helped her drop foot.  The numbness in her foot is milder and now intermittent.   She has very mild numbness and clumsiness of the right hand.  She has urinary frequency (has had for a year or more) but no incontinence.   Vision is fine.   No diplopia.    She notes more fatigue this year.   She sleeps well.  She denies any problems with mood or cognition.     In retrospect, five to six years ago, she had numbness in the  right face that lasted about 2 weeks and completely resolved.  Five years ago, she had numbness and clumsiness in the right hand for 2-3 weeks.  Symptoms again resolved.    Data: I personally reviewed the MRIs: MRI of the brain 03/26/2019 showed multiple T2/flair hyperintense foci in the periventricular, juxtacortical and deep white matter.  One focus in the left parietal deep white matter enhanced after contrast administration.  MRI of the cervical spine performed 03/26/2019 showed multiple T2 hyperintense foci within the spinal cord.  These are located to the left at C1-C2, to the right at C2-C3, posteriorly to the left at C3, posteriorly bilaterally at C4, anteriorly to the left at C5, anteriorly to the right at C5, posterolaterally to the right at C5-C6, to the left at C6-C7, and to the right at C7-T1.  2 foci, one anteriorly on the right and another posterior laterally to the left enhanced after contrast.  There are degenerative changes with bilateral disc osteophyte complexes at C4-C5 and disc protrusion at C5-C6.  MRI of the thoracic spine 03/26/2019 showed foci posteriorly to the left adjacent to C3-C4, and centrally adjacent to T7 and T9.  None of the foci enhance after contrast.  Her mother and (maternal) aunt both had multiple sclerosis.   Her mom died of complications from MS in 2015.   She was diagnosed in the mid  1990's.      Laboratory tests: During her hospital admission 03/26/2019 through 03/30/2019, she had lab work including neuromyelitis optica antibody which was negative.  Vitamin B12 was negative.  She had mild anemia with increased RDW and was found to have low iron.  Ferritin was 4.  Lymphocyte counts were normal.  Liver function tests were fine.  REVIEW OF SYSTEMS: Constitutional: No fevers, chills, sweats, or change in appetite Eyes: No visual changes, double vision, eye pain Ear, nose and throat: No hearing loss, ear pain, nasal congestion, sore throat Cardiovascular: No  chest pain, palpitations Respiratory: No shortness of breath at rest or with exertion.   No wheezes GastrointestinaI: No nausea, vomiting, diarrhea, abdominal pain, fecal incontinence Genitourinary: No dysuria, urinary retention or frequency.  No nocturia. Musculoskeletal: No neck pain, back pain Integumentary: No rash, pruritus, skin lesions Neurological: as above Psychiatric: No depression at this time.  No anxiety Endocrine: No palpitations, diaphoresis, change in appetite, change in weigh or increased thirst Hematologic/Lymphatic: No anemia, purpura, petechiae. Allergic/Immunologic: No itchy/runny eyes, nasal congestion, recent allergic reactions, rashes  ALLERGIES: No Known Allergies  HOME MEDICATIONS:  Current Outpatient Medications:  .  amLODipine (NORVASC) 5 MG tablet, Take 1 tablet (5 mg total) by mouth daily., Disp: 30 tablet, Rfl: 0 .  feeding supplement, ENSURE ENLIVE, (ENSURE ENLIVE) LIQD, Take 237 mLs by mouth 2 (two) times daily between meals., Disp: 237 mL, Rfl: 12 .  iron polysaccharides (NIFEREX) 150 MG capsule, Take 1 capsule (150 mg total) by mouth daily., Disp: 30 capsule, Rfl: 0 .  Multiple Vitamin (MULTIVITAMIN WITH MINERALS) TABS tablet, Take 1 tablet by mouth daily., Disp: 30 tablet, Rfl: 0 .  ondansetron (ZOFRAN) 4 MG tablet, Take 1 tablet (4 mg total) by mouth every 6 (six) hours as needed for nausea., Disp: 20 tablet, Rfl: 0  PAST MEDICAL HISTORY: Past Medical History:  Diagnosis Date  . Chlamydia   . Gestational hypertension   . No pertinent past medical history   . Trichimoniasis     PAST SURGICAL HISTORY: Past Surgical History:  Procedure Laterality Date  . MULTIPLE TOOTH EXTRACTIONS      FAMILY HISTORY: Family History  Problem Relation Age of Onset  . Hypertension Other     SOCIAL HISTORY:  Social History   Socioeconomic History  . Marital status: Single    Spouse name: Not on file  . Number of children: 3  . Years of education:  91  . Highest education level: Not on file  Occupational History  . Occupation: After [employer]  Tobacco Use  . Smoking status: Current Some Day Smoker  . Smokeless tobacco: Never Used  . Tobacco comment: once a month  Substance and Sexual Activity  . Alcohol use: Yes    Comment: occasional  . Drug use: No  . Sexual activity: Yes    Birth control/protection: I.U.D.  Other Topics Concern  . Not on file  Social History Narrative   Lives with her three kids   Caffeine use: no coffee, tea sometimes, 1-2 sodas per day   Right handed    Social Determinants of Health   Financial Resource Strain:   . Difficulty of Paying Living Expenses: Not on file  Food Insecurity:   . Worried About Programme researcher, broadcasting/film/video in the Last Year: Not on file  . Ran Out of Food in the Last Year: Not on file  Transportation Needs:   . Lack of Transportation (Medical): Not on file  . Lack  of Transportation (Non-Medical): Not on file  Physical Activity:   . Days of Exercise per Week: Not on file  . Minutes of Exercise per Session: Not on file  Stress:   . Feeling of Stress : Not on file  Social Connections:   . Frequency of Communication with Friends and Family: Not on file  . Frequency of Social Gatherings with Friends and Family: Not on file  . Attends Religious Services: Not on file  . Active Member of Clubs or Organizations: Not on file  . Attends Banker Meetings: Not on file  . Marital Status: Not on file  Intimate Partner Violence:   . Fear of Current or Ex-Partner: Not on file  . Emotionally Abused: Not on file  . Physically Abused: Not on file  . Sexually Abused: Not on file     PHYSICAL EXAM  Vitals:   04/03/19 1317  BP: 139/80  Pulse: 93  Temp: 97.8 F (36.6 C)  Weight: 135 lb 12.8 oz (61.6 kg)  Height: 5\' 2"  (1.575 m)    Body mass index is 24.84 kg/m.   General: The patient is well-developed and well-nourished and in no acute distress  HEENT:  Head is [place]/AT.   Sclera are anicteric.  Funduscopic exam shows normal optic discs and retinal vessels.  Neck: No carotid bruits are noted.  The neck is nontender.  Cardiovascular: The heart has a regular rate and rhythm with a normal S1 and S2. There were no murmurs, gallops or rubs.    Skin: Extremities are without rash or  edema.  Musculoskeletal:  Back is nontender  Neurologic Exam  Mental status: The patient is alert and oriented x 3 at the time of the examination. The patient has apparent normal recent and remote memory, with an apparently normal attention span and concentration ability.   Speech is normal.  Cranial nerves: Extraocular movements are full. Pupils are equal, round, and reactive to light and accomodation.  Visual fields are full.  Facial symmetry is present. There is good facial sensation to soft touch bilaterally.Facial strength is normal.  Trapezius and sternocleidomastoid strength is normal. No dysarthria is noted.  The tongue is midline, and the patient has symmetric elevation of the soft palate. No obvious hearing deficits are noted.  Motor:  Muscle bulk is normal.   Tone is slightly increased in the right leg.. Strength is  5 / 5 in the arms though rapid altering movements are slightly slowed on the right.  Strength is 2/5 in the foot extensors on the right.  Sensory: Sensory testing is intact to pinprick, soft touch and vibration sensation in the arms but she has mild reduced vibration sensation in the right leg..  Coordination: Cerebellar testing reveals good finger-nose-finger and heel-to-shin bilaterally.  Gait and station: Station is normal.   She has a right foot drop..  Tandem gait is poor.  Romberg is negative.   Reflexes: Deep tendon reflexes are increased in the legs with crossed adductor reflex bilaterally, more on the right.  There is no ankle clonus..   Plantar responses are flexor.    DIAGNOSTIC DATA (LABS, IMAGING, TESTING) - I reviewed patient records, labs,  notes, testing and imaging myself where available.  Lab Results  Component Value Date   WBC 20.7 (H) 03/30/2019   HGB 10.5 (L) 03/30/2019   HCT 32.6 (L) 03/30/2019   MCV 83.6 03/30/2019   PLT 331 03/30/2019      Component Value Date/Time   NA 138  03/30/2019 0401   K 3.8 03/30/2019 0401   CL 100 03/30/2019 0401   CO2 25 03/30/2019 0401   GLUCOSE 126 (H) 03/30/2019 0401   BUN 13 03/30/2019 0401   CREATININE 0.58 03/30/2019 0401   CALCIUM 8.8 (L) 03/30/2019 0401   PROT 5.8 (L) 03/30/2019 0401   ALBUMIN 3.3 (L) 03/30/2019 0401   AST 21 03/30/2019 0401   ALT 33 03/30/2019 0401   ALKPHOS 48 03/30/2019 0401   BILITOT 0.3 03/30/2019 0401   GFRNONAA >60 03/30/2019 0401   GFRAA >60 03/30/2019 0401   No results found for: CHOL, HDL, LDLCALC, LDLDIRECT, TRIG, CHOLHDL Lab Results  Component Value Date   HGBA1C 5.5 03/28/2019   Lab Results  Component Value Date   VITAMINB12 297 03/28/2019   No results found for: TSH     ASSESSMENT AND PLAN  Multiple sclerosis (Day) - Plan: Hepatitis B Core AB, Total, Hepatitis B surface antigen, QuantiFERON-TB Gold Plus, Hep B Surface Antibody, Varicella zoster antibody, IgG, Stratify JCV Antibody Test ([facility])  High risk medication use - Plan: Hepatitis B Core AB, Total, Hepatitis B surface antigen, QuantiFERON-TB Gold Plus, Hep B Surface Antibody, Varicella zoster antibody, IgG, Stratify JCV Antibody Test ([facility])  Right foot drop  Numbness   In summary, Ms. Gilroy is a 34 year old woman with newly diagnosed relapsing remitting multiple sclerosis.  Her neurologic examination is actually quite good given the extent of plaques in the spinal cord.  She has 2 enhancing lesions in the spinal cord, one anteriorly on the right that likely caused her symptoms.  Due to the aggressiveness of her MS, I recommend that she be initiated on highly effective therapy.  I recommended either Tysabri or Ocrevus and discussed the risks and benefits with her.  We  will check blood work for the JCV antibody, hepatitis B and tuberculosis.  Other lab work including HIV was negative in the hospital.  We will call her with the results next week and initiate one of the therapies.  If she is JCV antibody negative she will start Tysabri and if she is JCV antibody middle or high positive she will start Ocrevus.  Thank you for asking me to see Ms. Broadus John.  Please let me know if I can be of further assistance with her or other patients in the future.  Sakura Denis A. Felecia Shelling, MD, [facility] 10/03/4694, 2:95 PM Certified in Neurology, Clinical Neurophysiology, Sleep Medicine and Neuroimaging  [facility] [address], [place], [place] [address] [phone]

## 2019-04-03 NOTE — Telephone Encounter (Signed)
Placed JCV lab in [facility] lock box for routine lab pick up. Results pending. 

## 2019-04-05 LAB — QUANTIFERON-TB GOLD PLUS
QuantiFERON Mitogen Value: >10 IU/mL
QuantiFERON Nil Value: 0.03 IU/mL
QuantiFERON TB1 Ag Value: 0.38 IU/mL
QuantiFERON TB2 Ag Value: 0.2 IU/mL
QuantiFERON-TB Gold Plus: POSITIVE — AB

## 2019-04-05 LAB — HEPATITIS B CORE ANTIBODY, TOTAL: Hep B Core Total Ab: NEGATIVE

## 2019-04-05 LAB — HEPATITIS B SURFACE ANTIGEN: Hepatitis B Surface Ag: NEGATIVE

## 2019-04-05 LAB — HEPATITIS B SURFACE ANTIBODY,QUALITATIVE: Hep B Surface Ab, Qual: NONREACTIVE

## 2019-04-05 LAB — VARICELLA ZOSTER ANTIBODY, IGG: Varicella zoster IgG: 852 index (ref >165)

## 2019-04-10 ENCOUNTER — Other Ambulatory Visit: Payer: Self-pay | Admitting: Neurology

## 2019-04-10 ENCOUNTER — Telehealth: Payer: Self-pay | Admitting: *Deleted

## 2019-04-10 DIAGNOSIS — G35 Multiple sclerosis: Secondary | ICD-10-CM

## 2019-04-10 DIAGNOSIS — R7612 Nonspecific reaction to cell mediated immunity measurement of gamma interferon antigen response without active tuberculosis: Secondary | ICD-10-CM

## 2019-04-10 DIAGNOSIS — Z79899 Other long term (current) drug therapy: Secondary | ICD-10-CM

## 2019-04-10 NOTE — Telephone Encounter (Signed)
JCV ab drawn on 04/03/19 positive, index: 3.51. Gave to Dr. Epimenio Foot to review.

## 2019-04-10 NOTE — Telephone Encounter (Signed)
Called and spoke with pt. 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 Might be a false positive so Dr. Epimenio Foot would like her to get chest xray and repeat TB test. Instructed her to go to [facility] imaging at [address] [place], [place], [place] [address] Phone: [phone] to get this done, no appt needed. Order placed by Dr. Epimenio Foot already. She should go to [facility] at [address], [place], [place] [address] to get repeat TB test done. Pt verbalized understanding. I faxed lab order to them at [phone]. Received fax confirmation.

## 2019-04-16 ENCOUNTER — Other Ambulatory Visit: Payer: Self-pay | Admitting: Neurology

## 2019-04-16 ENCOUNTER — Ambulatory Visit
Admission: RE | Admit: 2019-04-16 | Discharge: 2019-04-16 | Disposition: A | Discharge status: Home / Self Care | Payer: 59 | Source: Ambulatory Visit | Attending: Neurology | Admitting: Neurology

## 2019-04-16 ENCOUNTER — Other Ambulatory Visit: Payer: Self-pay

## 2019-04-16 DIAGNOSIS — R7612 Nonspecific reaction to cell mediated immunity measurement of gamma interferon antigen response without active tuberculosis: Secondary | ICD-10-CM

## 2019-04-16 DIAGNOSIS — Z79899 Other long term (current) drug therapy: Secondary | ICD-10-CM

## 2019-04-16 IMAGING — CR DG CHEST 2V
2 series · 2 of 2 positions shown · non-contrast
Comparison: [DATE]

CLINICAL DATA: Positive QuantiFERON test

EXAM:
CHEST - 2 VIEW

[w chest pa]
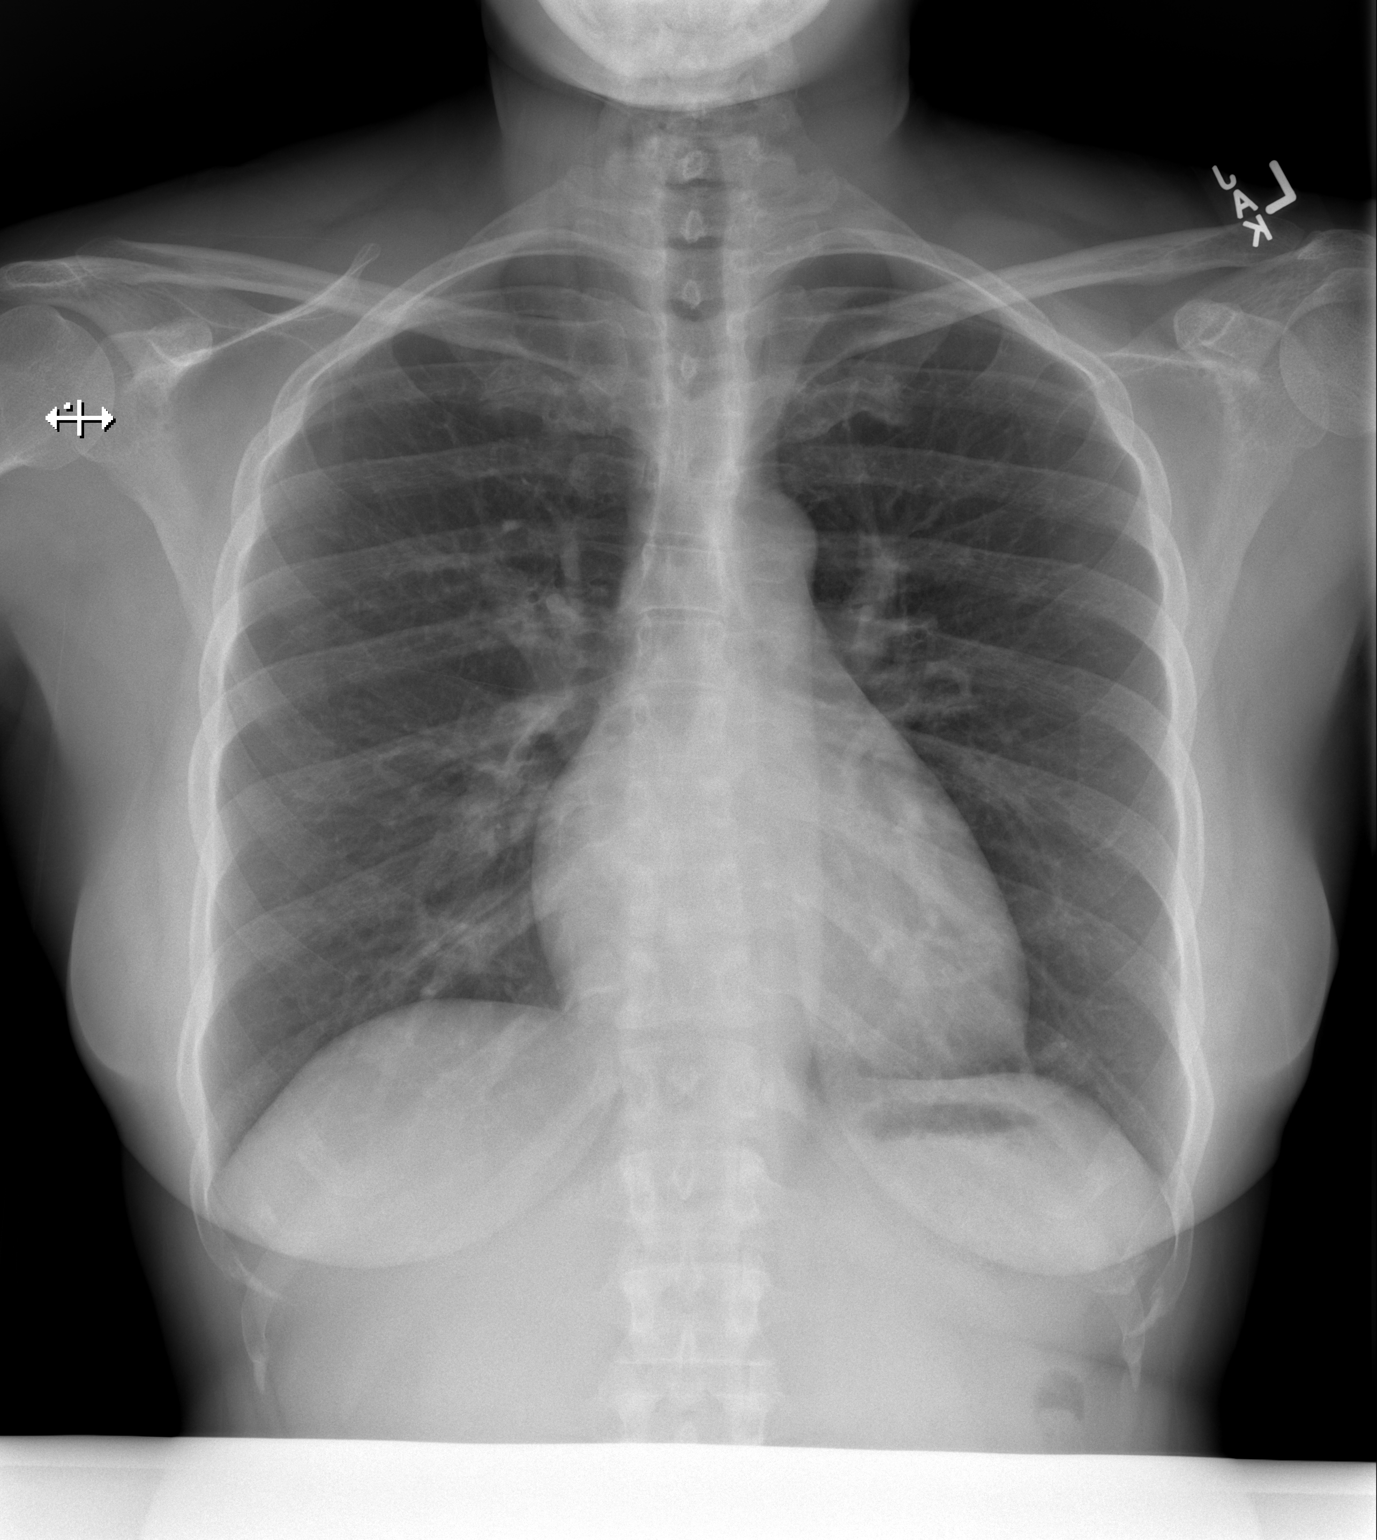

[w chest lat]
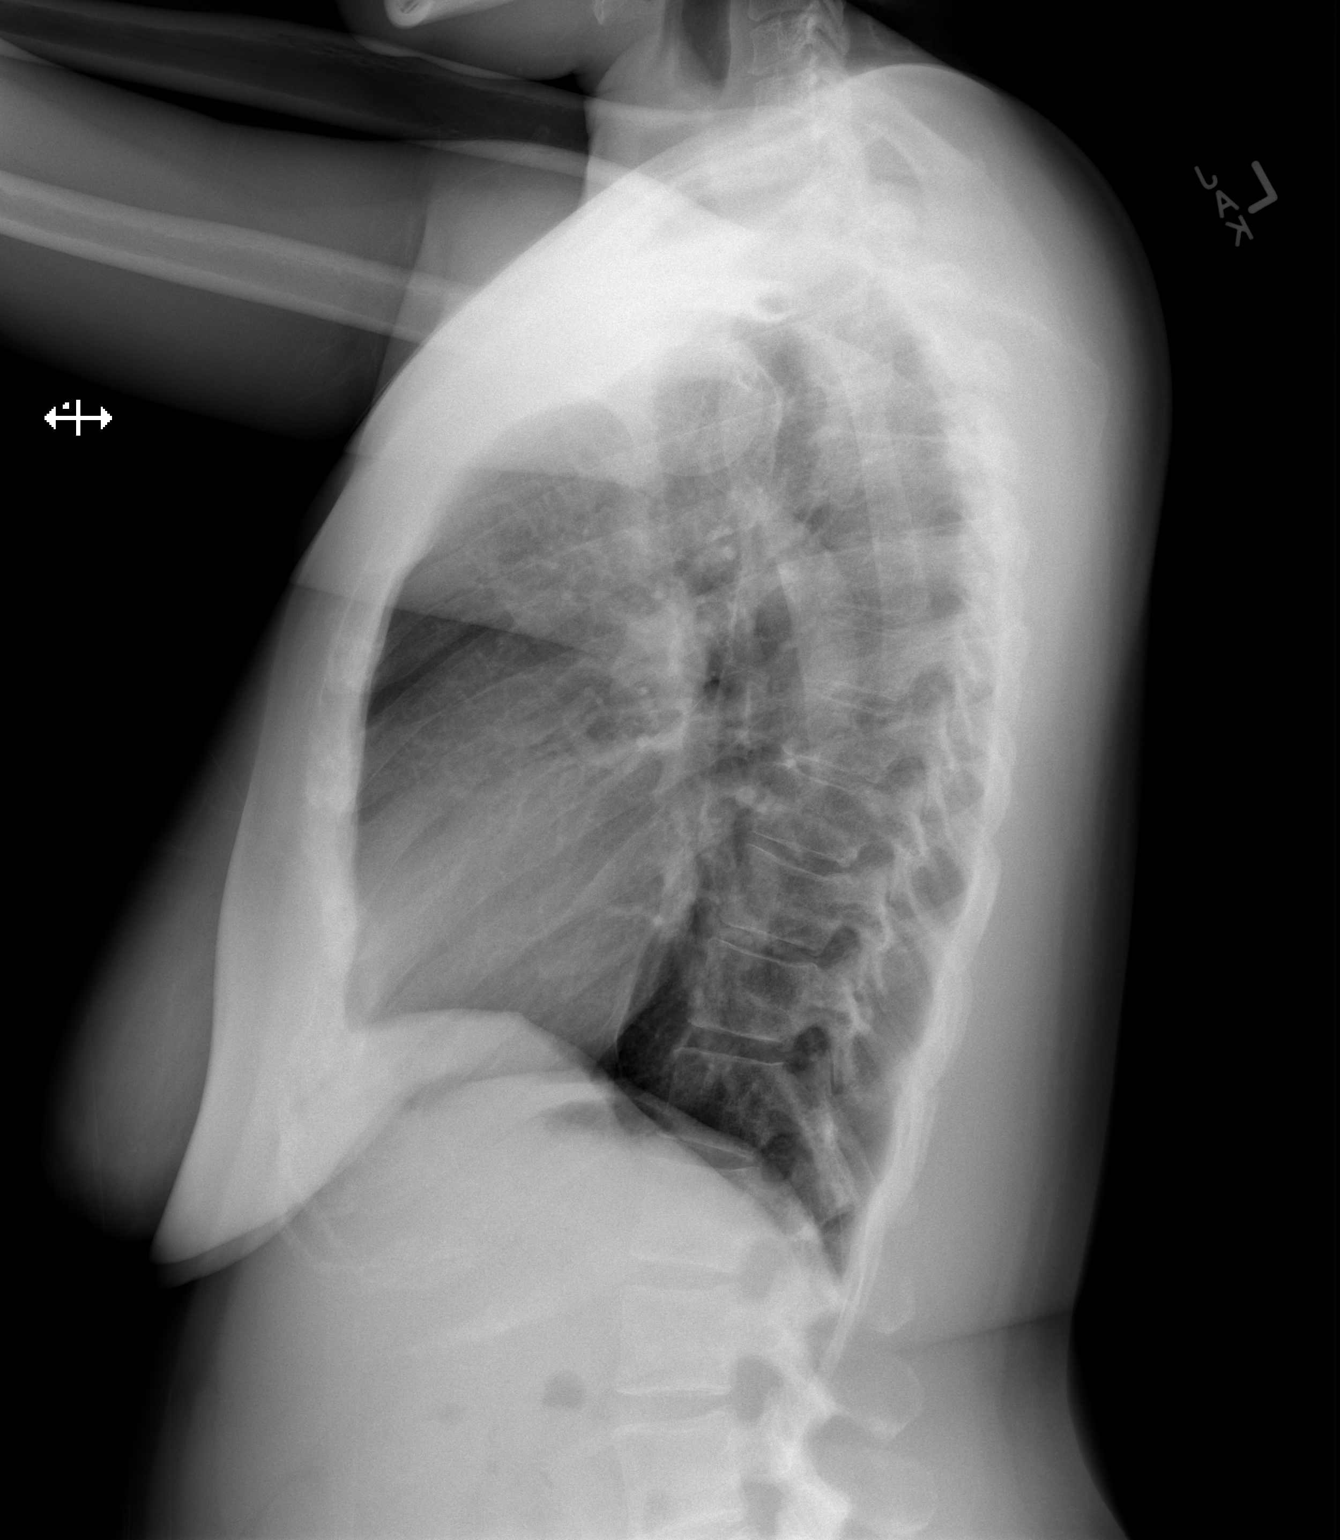

[2 of 2 positions shown; findings below may reference images not displayed]

FINDINGS: The heart size and mediastinal contours are within normal limits.
Both lungs are clear. The visualized skeletal structures are
unremarkable.
IMPRESSION: No active cardiopulmonary disease. No findings to suggest active TB
are noted.

## 2019-04-17 ENCOUNTER — Telehealth: Payer: Self-pay | Admitting: *Deleted

## 2019-04-17 NOTE — Telephone Encounter (Signed)
-----   Message from Asa Lente, MD sent at 04/17/2019  1:55 PM EST ----- Please let her know that the chest x-ray looks good.  Has she had a chance to recheck the TB test at [facility]?

## 2019-04-17 NOTE — Telephone Encounter (Signed)
Called and spoke with pt about results per Dr. Epimenio Foot note. Pt verbalized understanding. She went to [facility] yesterday and completed TB test. I called [facility] at [phone] to see if results are available yet. Spoke with rep. TB results still pending. Should be back in 5-7 days. I informed Dr. Epimenio Foot.

## 2019-04-18 LAB — QUANTIFERON-TB GOLD PLUS
Mitogen-NIL: >10 IU/mL
NIL: 0.03 IU/mL
QuantiFERON-TB Gold Plus: NEGATIVE
TB1-NIL: 0.08 IU/mL
TB2-NIL: 0.11 IU/mL

## 2019-04-23 ENCOUNTER — Telehealth: Payer: Self-pay | Admitting: *Deleted

## 2019-04-23 NOTE — Telephone Encounter (Signed)
-----   Message from Asa Lente, MD sent at 04/23/2019  8:32 AM EST ----- Her repeat tuberculosis test was normal and the x-ray was normal..  Therefore the first result was likely a false positive and we can send in the Ocrevus form to get her started

## 2019-04-23 NOTE — Telephone Encounter (Signed)
Called pt. Relayed results per Dr. Epimenio Foot note. She verbalized understanding. Advised it can take 1-2 weeks to obtain insurance auth and for infusion suite to call to get her scheduled once approved.

## 2019-05-01 ENCOUNTER — Ambulatory Visit: Payer: 59 | Attending: Internal Medicine | Admitting: Occupational Therapy

## 2019-05-01 ENCOUNTER — Ambulatory Visit: Payer: 59 | Admitting: Physical Therapy

## 2019-05-09 ENCOUNTER — Telehealth: Payer: Self-pay | Admitting: *Deleted

## 2019-05-09 NOTE — Telephone Encounter (Signed)
Faxed completed/signed appeal letter to [phone], marked urgent. Received fax confirmation. Waiting on determination.

## 2019-05-09 NOTE — Telephone Encounter (Signed)
Received notice from intrafusion here at [facility] that pt insurance out of network for them. She has to be referred out for infusions. I initiated PA on CMM for Ocrevus. Key: [id number] - PA Case ID: [id number]. Received the following response: "Request Reference Number: [id number]. OCREVUS INJ 300/10ML is denied due to Plan Exclusion. For further questions, call [phone]. Appeals are not supported through ePA. Please refer to the fax case notice for appeals information and instructions."  I called number provided to get more info. Spoke with Scarlette Calico.  She verified Ocrevus not covered by pharmacy or medical plan.  She advised we can contact appeal department at [phone]. Their fax number for standard: [phone]. She will fax me a copy of PA denial as well. I called [employer] to try and initiate an expedited appeal. Spoke with Baldo Ash. He was incorrect department. He transferred me. I spoke with Mia. She verified we can appeal a plan exclusion. Fax for expedited appeal requests: [phone]. Call reference # [id number].

## 2019-05-10 NOTE — Telephone Encounter (Signed)
Terri @ [employer] has called to urgently report that the ocrevus needs to be submitted thru pt's medical benefits(thru PAN) and not prescription benefits.  Terri's # is [phone]

## 2019-05-13 NOTE — Telephone Encounter (Signed)
Called, LVM for Terri to call back to further discuss

## 2019-05-13 NOTE — Telephone Encounter (Signed)
I called [employer] and spoke with Max B. With medical plan. Tried initiating PA Ocrevus [phone]). However, there is no referral showing in their system from PCP to our office. I explained she was referred from [facility] hospital. Unable to complete PA until referral showing in their system from PCP to our office.  Pt can call member services at [phone] if she has questions.  I called pt and relayed above info. She will call [facility] and then PCP if referral needed. She will call back with update once she knows more.

## 2019-05-14 ENCOUNTER — Telehealth: Payer: Self-pay | Admitting: Neurology

## 2019-05-14 NOTE — Telephone Encounter (Signed)
Phone rep checked office voicemails, pt was called back stating she did not get anywhere with [facility] and she does not have a PCP to use even if a referral was needed (Pt came to [facility] from hospital).  Pt is asking for a call back from [place], RN to discuss further what can be done for her.

## 2019-05-14 NOTE — Telephone Encounter (Signed)
I spoke to Alicia Rojas about getting started on Ocrevus.  We will have the difficulty with her insurance company (see multiple notes over the past couple weeks)  We are participating in a open label research study of Ocrevus in minority patients (chimes study).  I believe that she would qualify for that study.    She is interested in doing the study if it we will get her started more rapidly.  I will talk to our research coordinator to look into this possibility.

## 2019-05-14 NOTE — Telephone Encounter (Signed)
Dr. Epimenio Foot- I am not sure what to do. Her insurance is requiring a referral from her PCP to our office even though she has already been seen here. I cannot complete PA without them having a referral on file they said. How would you like to advise pt to proceed? She called back today and said she does not have a PCP

## 2019-05-15 NOTE — Telephone Encounter (Signed)
Pt has called to inform Kara Mead, RN that she got her medicaid card in the mail today Recipient ID:[id number]  This is Building surveyor can call pt If there is a question or concern about information provided

## 2019-05-15 NOTE — Telephone Encounter (Signed)
I am going to try to get her into the [place] minorities Ocrevus study.   If theres a problem, we will need her to see a PCP and get referred back

## 2019-05-15 NOTE — Telephone Encounter (Signed)
Spoke with Dr. Epimenio Foot. He asked Glynda in research to call her.

## 2019-05-20 ENCOUNTER — Telehealth: Payer: Self-pay | Admitting: Neurology

## 2019-05-20 DIAGNOSIS — G35 Multiple sclerosis: Secondary | ICD-10-CM

## 2019-05-20 MED ORDER — OXYBUTYNIN CHLORIDE 5 MG PO TABS: 5.0000 mg | ORAL_TABLET | Freq: Two times a day (BID) | ORAL | 5 refills | Status: DC

## 2019-05-20 NOTE — Telephone Encounter (Signed)
She presents today for the screening visit for the [place] Ocrevus study.  EDSS equals 3.0 Visual subscore is 1 (20/20 OD and 20/30 OS) Brainstem subscore is 0 Pyramidalsubscore is 3 (weakness on the right arm and leg and increased reflexes on the right with mild leg spasticity) Cerebellar subscore is 1 (due 2 reduced tandem gait gait ataxia) Sensory subscore is 2 due to reduced vibration and position sensation in the right leg Bladder subscore interested due to urinary urgency with occasional incontinence Cognitive subscore is 1 due to fatigue Ambulatory subscore is 1.  She is able to walk 500 m in about 10 minutes.  I will set up the MRIs of the brain and cervical spine.  I will send in a prescription for the bladder difficulties.  If the foot drop does not improve further, consider referral to an orthotist.

## 2019-06-03 ENCOUNTER — Ambulatory Visit: Payer: 59 | Admitting: Occupational Therapy

## 2019-06-03 ENCOUNTER — Ambulatory Visit: Payer: 59 | Attending: Internal Medicine | Admitting: Physical Therapy

## 2019-06-03 ENCOUNTER — Other Ambulatory Visit: Payer: Self-pay

## 2019-06-03 ENCOUNTER — Encounter: Payer: Self-pay | Admitting: Physical Therapy

## 2019-06-03 DIAGNOSIS — M21371 Foot drop, right foot: Secondary | ICD-10-CM

## 2019-06-03 DIAGNOSIS — R2689 Other abnormalities of gait and mobility: Secondary | ICD-10-CM | POA: Insufficient documentation

## 2019-06-03 DIAGNOSIS — M6281 Muscle weakness (generalized): Secondary | ICD-10-CM | POA: Insufficient documentation

## 2019-06-03 NOTE — Patient Instructions (Signed)
Strengthening: Hip Extension - Resisted    With tubing around right ankle, face anchor and pull leg straight back. Repeat _10__ times per set. Do _1___ sets per session. Do __1-2__ sessions per day.  http://orth.exer.us/637   Copyright  VHI. All rights reserved.  Strengthening: Hip Abduction - Resisted    With tubing around right leg, other side toward anchor, extend leg out from side. Repeat __10__ times per set. Do _1___ sets per session. Do 1-2____ sessions per day.  http://orth.exer.us/635   Copyright  VHI. All rights reserved.  Heel Raise: Unilateral (Standing)    Balance on left foot, then rise on ball of foot. Repeat _10_ times per set. Do _1__ sets per session. Do _1-2___ sessions per day.  http://orth.exer.us/41   Copyright  VHI. All rights reserved.  Eversion / Dorsiflexion (Eccentric), (Resistance Band)    Push foot out and up against resistance band. Slowly release for 3-5 seconds. Use _red_____ resistance band. _10__ reps per set, ___ sets per day, __5_ days per week. 1 http://ecce.exer.us/11   Copyright  VHI. All rights reserved.  Dorsiflexion (Eccentric), (Resistance Band)    Pull foot up against resistance band. Slowly release for 3-5 seconds. Use ___red_____ resistance band. _10_ reps per set, _1_ sets per day, 5 http://ecce.exer.us/1   Copyright  VHI. All rights reserved.

## 2019-06-04 NOTE — Therapy (Signed)
[facility] [address] [place], [place], [address] Phone: [phone]   Fax:  [phone]  Physical Therapy Evaluation  Patient Details  Name: Alicia Rojas MRN: [id number] Date of Birth: 05-31-1985 Referring Provider (PT): Marguerita Merles [place], [place] Cert sent to Dr. Despina Arias, MD  Encounter Date: 06/03/2019  PT End of Session - 06/04/19 2219    Visit Number  1    Number of Visits  9    Date for PT Re-Evaluation  07/05/19    Authorization Type  [employer]    PT Start Time  1017    PT Stop Time  1101    PT Time Calculation (min)  44 min    Equipment Utilized During Treatment  Other (comment)   Rt [place] Walk On AFO   Activity Tolerance  Patient tolerated treatment well    Behavior During Therapy  [facility] for tasks assessed/performed       Past Medical History:  Diagnosis Date  . Chlamydia   . Gestational hypertension   . No pertinent past medical history   . Trichimoniasis     Past Surgical History:  Procedure Laterality Date  . MULTIPLE TOOTH EXTRACTIONS      There were no vitals filed for this visit.       [facility] PT Assessment - 06/04/19 0001      Assessment   Medical Diagnosis  MS    Referring Provider (PT)  Marguerita Merles [place], DO    Onset Date/Surgical Date  03/25/19    Prior Therapy  none      Balance Screen   Has the patient fallen in the past 6 months  No    Has the patient had a decrease in activity level because of a fear of falling?   Yes   has had near falls due to Rt foot catching   Is the patient reluctant to leave their home because of a fear of falling?   No      Home Environment   Living Environment  Private residence    Type of Home  House    Home Access  Stairs to enter   1    Entrance Stairs-Number of Steps  1    Entrance Stairs-Rails  None      Prior Function   Level of Independence  Independent    Vocation  Full time employment    Vocation Requirements  pt works for  [employer] - is currently working remotely from home    Leisure  pt has 4 children - ages 94 - 8 yrs old      ROM / Strength   AROM / PROM / Strength  Strength      Strength   Overall Strength  Deficits    Strength Assessment Site  Hip;Knee;Ankle    Right/Left Hip  Right    Right Hip Flexion  4/5    Right Hip Extension  4-/5    Right Hip ABduction  3+/5    Right/Left Knee  Right    Right Knee Flexion  4/5    Right/Left Ankle  Right    Right Ankle Dorsiflexion  3+/5    Right Ankle Plantar Flexion  3+/5    Right Ankle Inversion  5/5    Right Ankle Eversion  3+/5      Ambulation/Gait   Ambulation/Gait  Yes    Ambulation/Gait Assistance  6: Modified independent (Device/Increase time)    Ambulation Distance (Feet)  100 Feet    Assistive device  None    Gait Pattern  Step-through pattern;Right steppage;Decreased dorsiflexion - right    Ambulation Surface  Level;Indoor    Gait Comments  [place] Walk On AFO trialed on RLE - pt would benefit from PLS AFO for RLE                Objective measurements completed on examination: See above findings.              PT Education - 06/04/19 2217    Education Details  pt instructed in HEP for Rt hip and ankle musc strengthening - pt given red theraband    Person(s) Educated  Patient    Methods  Explanation;Demonstration;Handout    Comprehension  Verbalized understanding;Returned demonstration       PT Short Term Goals - 06/04/19 2230      PT SHORT TERM GOAL #1   Title  same as LTG's        PT Long Term Goals - 06/04/19 2230      PT LONG TERM GOAL #1   Title  Obtain AFO consult for RLE (PLS?) for increased safety with ambulation.    Time  4    Period  Weeks    Status  New    Target Date  07/05/19      PT LONG TERM GOAL #2   Title  Independent in HEP including aquatic therapy exercises to be continued upon D/C from PT.    Time  4    Period  Weeks    Status  New    Target Date  07/05/19      PT LONG  TERM GOAL #3   Title  Pt will amb. 500' on even and uneven surfaces with Rt AFO with no device with minimal gait deviations and no LOB.    Time  4    Period  Weeks    Status  New    Target Date  07/05/19      PT LONG TERM GOAL #4   Title  Pt will negotiate 4 steps using step over step sequence without use of rail with AFO on RLE for safety with foot clearance.    Time  4    Period  Weeks    Status  New    Target Date  07/05/19             Plan - 06/04/19 2221    Clinical Impression Statement  Pt is a 34 yr old female who was diagnosed with MS on 03-25-19 after presenting to [facility] ED with gait deviaitons and RLE weakness and numbness in Rt foot.  Pt presents with Rt ankle musc. weakness resulting in gait deviations, i.e. steppage gait for Rt foot clearance in swing, and decreased push off in stance due to weak plantarflexors.  Pt would benefit from an AFO for her RLE for foot clearance in swing phase of gait for reduced gait deviations and for safety with gait to reduce fall risk.    Personal Factors and Comorbidities  Comorbidity 1;Age;Time since onset of injury/illness/exacerbation    Comorbidities  MS diagnosed on 03-25-19; h/o Bell's palsy    Examination-Activity Limitations  Squat;Stairs;Stand;Locomotion Level    Examination-Participation Restrictions  Cleaning;Meal Prep;Community Activity;Driving;School;Interpersonal Relationship;Laundry;Yard Work;Shop    Stability/Clinical Decision Making  Stable/Uncomplicated    Clinical Decision Making  Low    Rehab Potential  Good    PT Frequency  2x / week  PT Duration  4 weeks    PT Treatment/Interventions  ADLs/Self Care Home Management;Aquatic Therapy;Gait training;Stair training;Therapeutic activities;Therapeutic exercise;Balance training;Neuromuscular re-education;Orthotic Fit/Training;Patient/family education    PT Next Visit Plan  check HEP; cont RLE strengthening - gait with Rt AFO    PT Home Exercise Plan  Rt hip and ankle musc.  strengthening    Recommended Other Services  aquatic therapy    Consulted and Agree with Plan of Care  Patient       Patient will benefit from skilled therapeutic intervention in order to improve the following deficits and impairments:  Abnormal gait, Decreased activity tolerance, Decreased balance, Decreased strength, Impaired sensation  Visit Diagnosis: Other abnormalities of gait and mobility - Plan: PT plan of care cert/re-cert  Muscle weakness (generalized) - Plan: PT plan of care cert/re-cert  Foot drop, right - Plan: PT plan of care cert/re-cert     Problem List Patient Active Problem List   Diagnosis Date Noted  . High risk medication use 04/03/2019  . Right foot drop 04/03/2019  . Numbness 04/03/2019  . Hyperglycemia 03/28/2019  . Leukocytosis 03/28/2019  . Normocytic anemia 03/28/2019  . Iron deficiency anemia 03/28/2019  . Multiple sclerosis (HCC) 03/25/2019  . Elevated blood pressure reading without diagnosis of hypertension 03/25/2019  . Normal delivery 01/05/2011  . Threatened preterm labor 12/22/2010    Kary Kos, PT 06/04/2019, 10:44 PM  [facility] [address] [place], [place], [address] Phone: [phone]   Fax:  [phone]  Name: KATRINA DADDONA MRN: [id number] Date of Birth: 1985-10-05

## 2019-06-09 ENCOUNTER — Other Ambulatory Visit: Payer: 59

## 2019-06-09 ENCOUNTER — Other Ambulatory Visit: Payer: Self-pay

## 2019-06-09 ENCOUNTER — Ambulatory Visit
Admission: RE | Admit: 2019-06-09 | Discharge: 2019-06-09 | Disposition: A | Discharge status: Home / Self Care | Payer: No Typology Code available for payment source | Source: Ambulatory Visit | Attending: Neurology | Admitting: Neurology

## 2019-06-09 DIAGNOSIS — G35 Multiple sclerosis: Secondary | ICD-10-CM

## 2019-06-09 MED ORDER — GADOTERIDOL 279.3 MG/ML IV SOLN
11.0000 mL | Freq: Once | INTRAVENOUS | Status: AC | PRN
  Administered 2019-06-09: 11 mL via INTRAVENOUS

## 2019-06-10 ENCOUNTER — Telehealth: Payer: Self-pay | Admitting: *Deleted

## 2019-06-10 NOTE — Telephone Encounter (Signed)
-----   Message from Asa Lente, MD sent at 06/10/2019  2:28 PM EDT ----- The MRI did show a couple newer MS plaques, so it will be good to get started on the Ocrevus

## 2019-06-10 NOTE — Telephone Encounter (Signed)
Called pt. Relayed results per Dr. Epimenio Foot note. She verbalized understanding. She has appt with research 06/13/19.

## 2019-06-26 ENCOUNTER — Telehealth: Payer: Self-pay | Admitting: Neurology

## 2019-06-26 MED ORDER — AMLODIPINE BESYLATE 5 MG PO TABS: 5.0000 mg | ORAL_TABLET | Freq: Every day | ORAL | 3 refills | Status: AC

## 2019-06-26 MED ORDER — OXYBUTYNIN CHLORIDE 5 MG PO TABS: 5.0000 mg | ORAL_TABLET | Freq: Two times a day (BID) | ORAL | 11 refills | Status: DC

## 2019-06-26 NOTE — Telephone Encounter (Signed)
She had her 1st infusion without difficulty 2 weeks ago.   She felt strength and gait are improved.    BP elevated 160/95 today.  She has been prescribed amlodipine in the past but never took it.

## 2019-09-05 ENCOUNTER — Telehealth: Payer: Self-pay | Admitting: Neurology

## 2019-09-05 NOTE — Telephone Encounter (Signed)
EDSS equals 3.0 Visual subscore is 1 (20/25 OD and 20/25 OS) Brainstem subscore is 0 Pyramidalsubscore is 1 ( increased reflexes on the right with mild leg spasticity) Cerebellar subscore is 1 (due to reduced tandem gait) Sensory subscore is 1 due to reduced vibrationin the right leg Bladder subscore1 due to urinary urgency without incontinence Cognitive subscore is 1 due to fatigue Ambulatory subscore is 1.  She is able to walk 1500 m in about 20 minutes

## 2019-11-28 ENCOUNTER — Other Ambulatory Visit: Payer: Self-pay | Admitting: Neurology

## 2019-11-28 DIAGNOSIS — G35 Multiple sclerosis: Secondary | ICD-10-CM

## 2019-11-29 ENCOUNTER — Ambulatory Visit
Admission: RE | Admit: 2019-11-29 | Discharge: 2019-11-29 | Disposition: A | Discharge status: Home / Self Care | Payer: No Typology Code available for payment source | Source: Ambulatory Visit | Attending: Neurology | Admitting: Neurology

## 2019-11-29 DIAGNOSIS — G35 Multiple sclerosis: Secondary | ICD-10-CM

## 2019-11-29 MED ORDER — GADOTERIDOL 279.3 MG/ML IV SOLN
11.0000 mL | Freq: Once | INTRAVENOUS | Status: AC | PRN
  Administered 2019-11-29: 11 mL via INTRAVENOUS

## 2019-12-03 ENCOUNTER — Telehealth: Payer: Self-pay | Admitting: *Deleted

## 2019-12-03 NOTE — Telephone Encounter (Signed)
-----   Message from Asa Lente, MD sent at 12/02/2019 11:50 AM EDT ----- Please let her know that the MRI of the brain looks good.  The old lesions other but none of the lesions appear to be brand-new.  Compared to her last MRI there is only 1 small new lesion.  We will continue the Ocrevus.

## 2019-12-03 NOTE — Telephone Encounter (Signed)
Called and spoke with pt about MRI results. She verbalized understanding. She was with research at our office at time of call.

## 2020-05-06 ENCOUNTER — Other Ambulatory Visit: Payer: Self-pay | Admitting: Neurology

## 2020-05-06 DIAGNOSIS — G35 Multiple sclerosis: Secondary | ICD-10-CM

## 2020-05-12 ENCOUNTER — Other Ambulatory Visit: Payer: Self-pay

## 2020-05-12 ENCOUNTER — Ambulatory Visit
Admission: RE | Admit: 2020-05-12 | Discharge: 2020-05-12 | Disposition: A | Discharge status: Home / Self Care | Payer: No Typology Code available for payment source | Source: Ambulatory Visit | Attending: Neurology | Admitting: Neurology

## 2020-05-12 DIAGNOSIS — G35 Multiple sclerosis: Secondary | ICD-10-CM

## 2020-05-12 MED ORDER — GADOTERIDOL 279.3 MG/ML IV SOLN
11.0000 mL | Freq: Once | INTRAVENOUS | Status: AC | PRN
  Administered 2020-05-12: 11 mL via INTRAVENOUS

## 2021-01-05 ENCOUNTER — Other Ambulatory Visit: Payer: Self-pay | Admitting: Neurology

## 2021-01-05 DIAGNOSIS — G35 Multiple sclerosis: Secondary | ICD-10-CM

## 2021-04-07 ENCOUNTER — Ambulatory Visit
Admission: RE | Admit: 2021-04-07 | Discharge: 2021-04-07 | Disposition: A | Discharge status: Home / Self Care | Payer: No Typology Code available for payment source | Source: Ambulatory Visit | Attending: Neurology | Admitting: Neurology

## 2021-04-07 DIAGNOSIS — G35 Multiple sclerosis: Secondary | ICD-10-CM

## 2021-04-07 MED ORDER — GADOTERIDOL 279.3 MG/ML IV SOLN
14.0000 mL | Freq: Once | INTRAVENOUS | Status: AC | PRN
  Administered 2021-04-07: 14 mL via INTRAVENOUS

## 2021-04-08 ENCOUNTER — Telehealth: Payer: Self-pay | Admitting: Neurology

## 2021-04-08 NOTE — Telephone Encounter (Signed)
-----   Message from Asa Lente, MD sent at 04/07/2021  7:58 PM EST ----- Please let the patient know that the MRI looks good.  There were no new MS lesions.

## 2021-04-08 NOTE — Telephone Encounter (Signed)
Called the patient and reviewed the MRI images. Advised no new findings.

## 2021-04-16 ENCOUNTER — Telehealth: Payer: Self-pay | Admitting: Neurology

## 2021-04-16 NOTE — Telephone Encounter (Signed)
She is here today for a CHIMES study visit and infusion.  EDSS equals 1.5 (pyramidal and bladder scores were 1).  She is walking well and feels she walks this past and as far as other people her age.  She continues to tolerate the study medication well.  She has had anemia.  I discussed this with her.  She is taking iron and will be discussing further with her primary care.

## 2021-10-07 ENCOUNTER — Telehealth: Payer: Self-pay | Admitting: Neurology

## 2021-10-07 NOTE — Telephone Encounter (Signed)
She is here today for a CHIMES study visit and infusion.   EDSS equals 1.5 (sensory, bladder and ambulation scores were 1).    She is walking well easily doing one mile in 20 minutes - though walked a little better a couple years ago.   .   She continues to tolerate the study medication well.   She has had anemia.  I discussed this with her.  She will take OTC iron and will be discussing further with her primary care.  Infusion today 600 mg Ocrelizumab was without incident

## 2021-10-09 ENCOUNTER — Other Ambulatory Visit: Payer: Self-pay | Admitting: Neurology

## 2021-10-09 MED ORDER — OCREVUS 300 MG/10ML IV SOLN: INTRAVENOUS | 3 refills | Status: DC

## 2021-12-13 ENCOUNTER — Other Ambulatory Visit: Payer: Self-pay | Admitting: Neurology

## 2021-12-13 DIAGNOSIS — G35 Multiple sclerosis: Secondary | ICD-10-CM

## 2021-12-14 ENCOUNTER — Telehealth: Payer: Self-pay | Admitting: Neurology

## 2021-12-14 NOTE — Telephone Encounter (Signed)
MRI sent to [facility] research patient

## 2022-03-12 ENCOUNTER — Other Ambulatory Visit: Payer: No Typology Code available for payment source

## 2022-03-14 ENCOUNTER — Other Ambulatory Visit: Payer: No Typology Code available for payment source

## 2022-03-17 ENCOUNTER — Ambulatory Visit
Admission: RE | Admit: 2022-03-17 | Discharge: 2022-03-17 | Disposition: A | Discharge status: Home / Self Care | Payer: No Typology Code available for payment source | Source: Ambulatory Visit | Attending: Neurology | Admitting: Neurology

## 2022-03-17 DIAGNOSIS — G35 Multiple sclerosis: Secondary | ICD-10-CM

## 2022-03-17 MED ORDER — GADOTERIDOL 279.3 MG/ML IV SOLN
14.0000 mL | Freq: Once | INTRAVENOUS | Status: AC | PRN
  Administered 2022-03-17: 14 mL via INTRAVENOUS

## 2022-03-18 ENCOUNTER — Telehealth: Payer: Self-pay | Admitting: Neurology

## 2022-03-18 NOTE — Telephone Encounter (Signed)
She was here today for a study visit for the CHIMES (Ocrevus) study.  She reports that her MS is stable and she denies any new symptoms.  No exacerbations.  She had an MRI of the brain and cervical spine yesterday and there were no new lesions.  EDSS was 2.0 today (cognitive 2 due to some verbal fluency issues, 1 sensory 1 bladder and 1 ambulatory)  Although she tolerated the infusion well.  Blood pressure was elevated and she received 20 mg IV hydralazine with improvement.  She is on amlodipine but has only rarely taken it because it increased her urinary urgency.  She has an appointment to see her primary care provider later this afternoon and she will discuss the blood pressure.  Additionally, she appears to have a mild microcytic anemia based on recent blood work.  I advised her to take iron over-the-counter 1 pill a day.

## 2022-09-02 ENCOUNTER — Telehealth: Payer: Self-pay | Admitting: Neurology

## 2022-09-02 NOTE — Telephone Encounter (Signed)
She reports that she is doing very well.  She denies any new neurologic symptoms.  She has continued to tolerate the ocrelizumab well.  She is back on amlodipine 5 mg for her blood pressure.  Although she could walk 2 to 3 miles without stopping, she feels tired by the end of a long walk compared to friends and may start to slow down.  She easily walks  She comes in today for the week 144 visit. Physical examination was normal. Neurologic examination just showed mild asymmetry of vibration sensation in the toes with normal position sense.  She reported that she feels cognitively not as sharp as she did a few years ago and she gives some examples of keeping up with her kids homework assignments and occasionally forgetting to do things.  She has urinary urgency with very rare incontinence if she cannot get to the bathroom in time.  EDSS is 2.0 (sensory 1, bladder 1, Cerebral 2, Ambulatory 1)  She had her infusion without complication.  She completed the study visit evaluations.

## 2022-09-05 NOTE — Telephone Encounter (Signed)
Called pt at [phone]. Received automated message that call could not be completed.  Called sister on DPR/Jasmine Reagor at [phone]. Received automated message that call could not be completed. Called spoke w/ sister at [phone]. Scheduled appt for 09/12/22 at 930am w/ Dr. Epimenio Foot. She will let her sister know about appt and to check in at 9am.   She will need in office visit with Dr. Epimenio Foot to transition from research study back to the office/infusion suite for Ocrevus.  If pt calls, please offer 09/08/22 at 9am with Dr. Epimenio Foot.

## 2022-09-12 ENCOUNTER — Ambulatory Visit: Payer: Self-pay | Admitting: Neurology

## 2022-09-12 ENCOUNTER — Encounter: Payer: Self-pay | Admitting: Neurology

## 2022-09-12 NOTE — Telephone Encounter (Signed)
Phone room: pt no showed appt today. Can you please call sister and offer appt for 09/26/22 at 3pm w/ Dr. Epimenio Foot?

## 2022-09-13 NOTE — Telephone Encounter (Signed)
Noted, I scheduled pt 

## 2022-09-24 NOTE — Progress Notes (Unsigned)
[facility]  PATIENT: Alicia Rojas DOB: 1985/10/05  REFERRING DOCTOR OR PCP: Dr. Beatrix Fetters Aroor SOURCE: Patient, notes from recent hospitalization, imaging and laboratory reports, MRI images personally reviewed.  _________________________________   HISTORICAL  CHIEF COMPLAINT:  No chief complaint on file.   HISTORY OF PRESENT ILLNESS:  Alicia Rojas is a 37 y.o. woman withr elapsing remitting multiple sclerosis.  Update 09/24/2022 She has been on Ocrevus since 05/2019 as part of the CHIMES MS study (minorities open label Ocrevus).   She has tolerated the drug well.   She is near the end of the study.  She has not had any new lesions on brain MRI or any exacerbations since starting Ocrelizumab.      Currently, she notes continued reduced gait.   She was fitted for a right foot brace and that has helped her drop foot.  The numbness in her foot is milder and now intermittent.   She has very mild numbness and clumsiness of the right hand.  She has urinary frequency (has had for a year or more) but no incontinence.   Vision is fine.   No diplopia.    Neurologic examination just showed mild asymmetry of vibration sensation in the toes with normal position sense. She reported that she feels cognitively not as sharp as she did a few years ago and she gives some examples of keeping up with her kids homework assignments and occasionally forgetting to do things. She has urinary urgency with very rare incontinence if she cannot get to the bathroom in time.   She notes more fatigue this year.   She sleeps well.  She denies any problems with mood or cognition.        MS history: Around 2015, she had numbness in the right face that lasted about 2 weeks and completely resolved.  In 2016,  she had numbness and clumsiness in the right hand for 2-3 weeks.  Symptoms again resolved.    She noted difficulty with picking up her right leg as she walked around Christmas 2020.   She  was stumbling but had no falls.  She also noted swelling and pain in the right ankle.   She had numbness in the right foot.   She did not note any symptoms on the left side of her body but there was also a little bit of clumsiness in the right hand.  She presented to the emergency room at [facility]  MRI was consistent with MS and she was admitted for IV steroids.   I saw her in early 2021.   She was enrolled into the CHIMES minorities open label Ocrevus study.     Data: MRI brain 06/09/2019, 11/29/2019, 05/13/2019, 04/07/2921 and 03/18/2022 were all unchanged.    MRI of the brain 03/26/2019 showed multiple T2/flair hyperintense foci in the periventricular, juxtacortical and deep white matter.  One focus in the left parietal deep white matter enhanced after contrast administration.  MRI of the cervical spine performed 03/26/2019 showed multiple T2 hyperintense foci within the spinal cord.  These are located to the left at C1-C2, to the right at C2-C3, posteriorly to the left at C3, posteriorly bilaterally at C4, anteriorly to the left at C5, anteriorly to the right at C5, posterolaterally to the right at C5-C6, to the left at C6-C7, and to the right at C7-T1.  2 foci, one anteriorly on the right and another posterior laterally to the left enhanced after contrast.  There are degenerative  changes with bilateral disc osteophyte complexes at C4-C5 and disc protrusion at C5-C6.  MRI of the thoracic spine 03/26/2019 showed foci posteriorly to the left adjacent to C3-C4, and centrally adjacent to T7 and T9.  None of the foci enhance after contrast.    Her mother and (maternal) aunt both had multiple sclerosis.   Her mom died of complications from MS in 2015.   She was diagnosed in the mid 1990's.      Laboratory tests: During her hospital admission 03/26/2019 through 03/30/2019, she had lab work including neuromyelitis optica antibody which was negative.  Vitamin B12 was negative.  She had mild anemia with  increased RDW and was found to have low iron.  Ferritin was 4.  Lymphocyte counts were normal.  Liver function tests were fine.  REVIEW OF SYSTEMS: Constitutional: No fevers, chills, sweats, or change in appetite Eyes: No visual changes, double vision, eye pain Ear, nose and throat: No hearing loss, ear pain, nasal congestion, sore throat Cardiovascular: No chest pain, palpitations Respiratory:  No shortness of breath at rest or with exertion.   No wheezes GastrointestinaI: No nausea, vomiting, diarrhea, abdominal pain, fecal incontinence Genitourinary:  No dysuria, urinary retention or frequency.  No nocturia. Musculoskeletal:  No neck pain, back pain Integumentary: No rash, pruritus, skin lesions Neurological: as above Psychiatric: No depression at this time.  No anxiety Endocrine: No palpitations, diaphoresis, change in appetite, change in weigh or increased thirst Hematologic/Lymphatic:  No anemia, purpura, petechiae. Allergic/Immunologic: No itchy/runny eyes, nasal congestion, recent allergic reactions, rashes  ALLERGIES: No Known Allergies  HOME MEDICATIONS:  Current Outpatient Medications:    amLODipine (NORVASC) 5 MG tablet, Take 1 tablet (5 mg total) by mouth daily., Disp: 90 tablet, Rfl: 3   feeding supplement, ENSURE ENLIVE, (ENSURE ENLIVE) LIQD, Take 237 mLs by mouth 2 (two) times daily between meals., Disp: 237 mL, Rfl: 12   iron polysaccharides (NIFEREX) 150 MG capsule, Take 1 capsule (150 mg total) by mouth daily., Disp: 30 capsule, Rfl: 0   Multiple Vitamin (MULTIVITAMIN WITH MINERALS) TABS tablet, Take 1 tablet by mouth daily., Disp: 30 tablet, Rfl: 0   ocrelizumab (OCREVUS) 300 MG/10ML injection, 600 mg IV every 6 months, Disp: 20 mL, Rfl: 3   ondansetron (ZOFRAN) 4 MG tablet, Take 1 tablet (4 mg total) by mouth every 6 (six) hours as needed for nausea., Disp: 20 tablet, Rfl: 0   oxybutynin (DITROPAN) 5 MG tablet, Take 1 tablet (5 mg total) by mouth 2 (two) times  daily., Disp: 60 tablet, Rfl: 11  PAST MEDICAL HISTORY: Past Medical History:  Diagnosis Date   Chlamydia    Gestational hypertension    No pertinent past medical history    Trichimoniasis     PAST SURGICAL HISTORY: Past Surgical History:  Procedure Laterality Date   MULTIPLE TOOTH EXTRACTIONS      FAMILY HISTORY: Family History  Problem Relation Age of Onset   Hypertension Other     SOCIAL HISTORY:  Social History   Socioeconomic History   Marital status: Single    Spouse name: Not on file   Number of children: 3   Years of education: 12   Highest education level: Not on file  Occupational History   Occupation: After [employer]  Tobacco Use   Smoking status: Some Days   Smokeless tobacco: Never   Tobacco comments:    once a month  Substance and Sexual Activity   Alcohol use: Yes    Comment: occasional  Drug use: No   Sexual activity: Yes    Birth control/protection: I.U.D.  Other Topics Concern   Not on file  Social History Narrative   Lives with her three kids   Caffeine use: no coffee, tea sometimes, 1-2 sodas per day   Right handed    Social Determinants of Health   Financial Resource Strain: Not on file  Food Insecurity: Not on file  Transportation Needs: Not on file  Physical Activity: Not on file  Stress: Not on file  Social Connections: Not on file  Intimate Partner Violence: Not on file     PHYSICAL EXAM  There were no vitals filed for this visit.   There is no height or weight on file to calculate BMI.   General: The patient is well-developed and well-nourished and in no acute distress  HEENT:  Head is [place]/AT.  Sclera are anicteric.  Funduscopic exam shows normal optic discs and retinal vessels.  Neck: No carotid bruits are noted.  The neck is nontender.  Cardiovascular: The heart has a regular rate and rhythm with a normal S1 and S2. There were no murmurs, gallops or rubs.    Skin: Extremities are without rash or   edema.  Musculoskeletal:  Back is nontender  Neurologic Exam  Mental status: The patient is alert and oriented x 3 at the time of the examination. The patient has apparent normal recent and remote memory, with an apparently normal attention span and concentration ability.   Speech is normal.  Cranial nerves: Extraocular movements are full. Pupils are equal, round, and reactive to light and accomodation.  Visual fields are full.  Facial symmetry is present. There is good facial sensation to soft touch bilaterally.Facial strength is normal.  Trapezius and sternocleidomastoid strength is normal. No dysarthria is noted.  The tongue is midline, and the patient has symmetric elevation of the soft palate. No obvious hearing deficits are noted.  Motor:  Muscle bulk is normal.   Tone is slightly increased in the right leg.. Strength is  5 / 5 in the arms though rapid altering movements are slightly slowed on the right.  Strength is 2/5 in the foot extensors on the right.  Sensory: Sensory testing is intact to pinprick, soft touch and vibration sensation in the arms but she has mild reduced vibration sensation in the right leg..  Coordination: Cerebellar testing reveals good finger-nose-finger and heel-to-shin bilaterally.  Gait and station: Station is normal.   She has a right foot drop..  Tandem gait is poor.  Romberg is negative.   Reflexes: Deep tendon reflexes are increased in the legs with crossed adductor reflex bilaterally, more on the right.  There is no ankle clonus..   Plantar responses are flexor.    DIAGNOSTIC DATA (LABS, IMAGING, TESTING) - I reviewed patient records, labs, notes, testing and imaging myself where available.  Lab Results  Component Value Date   WBC 20.7 (H) 03/30/2019   HGB 10.5 (L) 03/30/2019   HCT 32.6 (L) 03/30/2019   MCV 83.6 03/30/2019   PLT 331 03/30/2019      Component Value Date/Time   NA 138 03/30/2019 0401   K 3.8 03/30/2019 0401   CL 100 03/30/2019  0401   CO2 25 03/30/2019 0401   GLUCOSE 126 (H) 03/30/2019 0401   BUN 13 03/30/2019 0401   CREATININE 0.58 03/30/2019 0401   CALCIUM 8.8 (L) 03/30/2019 0401   PROT 5.8 (L) 03/30/2019 0401   ALBUMIN 3.3 (L) 03/30/2019 0401  AST 21 03/30/2019 0401   ALT 33 03/30/2019 0401   ALKPHOS 48 03/30/2019 0401   BILITOT 0.3 03/30/2019 0401   GFRNONAA >60 03/30/2019 0401   GFRAA >60 03/30/2019 0401   No results found for: "CHOL", "HDL", "LDLCALC", "LDLDIRECT", "TRIG", "CHOLHDL" Lab Results  Component Value Date   HGBA1C 5.5 03/28/2019   Lab Results  Component Value Date   VITAMINB12 297 03/28/2019   No results found for: "TSH"     ASSESSMENT AND PLAN  No diagnosis found.   In summary, Ms. Balthrop is a 37 year old woman with newly diagnosed relapsing remitting multiple sclerosis.  Her neurologic examination is actually quite good given the extent of plaques in the spinal cord.  She has 2 enhancing lesions in the spinal cord, one anteriorly on the right that likely caused her symptoms.  Due to the aggressiveness of her MS, I recommend that she be initiated on highly effective therapy.  I recommended either Tysabri or Ocrevus and discussed the risks and benefits with her.  We will check blood work for the JCV antibody, hepatitis B and tuberculosis.  Other lab work including HIV was negative in the hospital.  We will call her with the results next week and initiate one of the therapies.  If she is JCV antibody negative she will start Tysabri and if she is JCV antibody middle or high positive she will start Ocrevus.  Thank you for asking me to see Ms. Jomarie Longs.  Please let me know if I can be of further assistance with her or other patients in the future.  Larry Knipp A. Epimenio Foot, MD, [facility] 09/24/2022, 8:16 PM Certified in Neurology, Clinical Neurophysiology, Sleep Medicine and Neuroimaging  [facility] [address] [place], [place] [address] [phone]

## 2022-09-26 ENCOUNTER — Encounter: Payer: Self-pay | Admitting: Neurology

## 2022-09-26 ENCOUNTER — Ambulatory Visit: Payer: No Typology Code available for payment source | Admitting: Neurology

## 2022-09-26 VITALS — BP 146/91 | HR 86 | Ht 62.0 in | Wt 150.4 lb

## 2022-09-26 DIAGNOSIS — G35 Multiple sclerosis: Secondary | ICD-10-CM | POA: Diagnosis not present

## 2022-09-26 DIAGNOSIS — R030 Elevated blood-pressure reading, without diagnosis of hypertension: Secondary | ICD-10-CM | POA: Diagnosis not present

## 2022-09-26 DIAGNOSIS — Z79899 Other long term (current) drug therapy: Secondary | ICD-10-CM

## 2022-09-26 DIAGNOSIS — R200 Anesthesia of skin: Secondary | ICD-10-CM

## 2022-09-26 DIAGNOSIS — E559 Vitamin D deficiency, unspecified: Secondary | ICD-10-CM

## 2022-09-27 ENCOUNTER — Other Ambulatory Visit: Payer: Self-pay | Admitting: Neurology

## 2022-09-27 ENCOUNTER — Telehealth: Payer: Self-pay | Admitting: *Deleted

## 2022-09-27 LAB — HEPATITIS B CORE ANTIBODY, TOTAL: Hep B Core Total Ab: NEGATIVE

## 2022-09-27 LAB — IGG, IGA, IGM
IgA/Immunoglobulin A, Serum: 81 mg/dL — ABNORMAL LOW (ref 87–352)
IgG (Immunoglobin G), Serum: 839 mg/dL (ref 586–1602)
IgM (Immunoglobulin M), Srm: 77 mg/dL (ref 26–217)

## 2022-09-27 LAB — VITAMIN D 25 HYDROXY (VIT D DEFICIENCY, FRACTURES): Vit D, 25-Hydroxy: 13.3 ng/mL — ABNORMAL LOW (ref 30.0–100.0)

## 2022-09-27 LAB — HEPATITIS B SURFACE ANTIGEN: Hepatitis B Surface Ag: NEGATIVE

## 2022-09-27 MED ORDER — VITAMIN D (ERGOCALCIFEROL) 1.25 MG (50000 UNIT) PO CAPS: 50000.0000 [IU] | ORAL_CAPSULE | ORAL | 1 refills | Status: DC

## 2022-09-27 NOTE — Telephone Encounter (Signed)
Faxed and confirmation received [phone].

## 2022-10-05 ENCOUNTER — Telehealth: Payer: Self-pay | Admitting: Neurology

## 2022-10-05 NOTE — Telephone Encounter (Signed)
Joben @ [employer]:[phone] option 4 is asking for a call from clinical staff re: reason for pt's referral and to discuss pt insurance information.

## 2022-10-10 NOTE — Telephone Encounter (Signed)
Yes, I spoke with Selena Batten in [place] and was informed that PA was done on 10/01/22 by [facility]. [facility] is waiting on patient to update them with new insurance. Current insurance denied PA.

## 2022-10-10 NOTE — Telephone Encounter (Signed)
Received fax, asking for Step one information, insurance information is incomplete, it pts insurance is denying coverage for ocrevus , please attach denial reason and denial letter. Once completed fax back to [phone]. [phone] ov

## 2022-10-24 NOTE — Telephone Encounter (Signed)
   Received from [facility] the above message.

## 2022-11-03 NOTE — Telephone Encounter (Signed)
I spoke with [facility] in [place]. They are working on getting her enrolled in the [employer].

## 2022-11-09 NOTE — Telephone Encounter (Signed)
I called patient.  I discussed this with her.  She is considering going back on Medicaid.  She reports that her last Ocrevus infusion was June 2024.  She will let us know if she changes insurances and we can attempt to get her Ocrevus again.  I will let Dr. Epimenio Foot know.  Patient verbalized understanding and appreciation.

## 2022-11-09 NOTE — Telephone Encounter (Signed)
I spoke with the infusion suite.  Since patient's insurance will not provide a denial letter for coverage services, Alicia Rojas cannot authorize her for free drug program.  I called [employer] insurance.  Infusions are not covered under patient's limited coverage plan.  Nor are brand-name drugs.  They cannot provide me any denials of claims or authorization submitted for the patient regarding her Ocrevus infusions.

## 2022-11-10 NOTE — Telephone Encounter (Signed)
Unable to reach pt over the phone to schedule follow up. I will attempt to call pt again at a later time.

## 2022-11-14 NOTE — Telephone Encounter (Addendum)
Pt scheduled for appointment with Dr. Epimenio Foot for 02/06/23 at 1:30pm

## 2023-01-02 ENCOUNTER — Other Ambulatory Visit: Payer: Self-pay | Admitting: Neurology

## 2023-01-02 DIAGNOSIS — G35 Multiple sclerosis: Secondary | ICD-10-CM

## 2023-01-11 ENCOUNTER — Ambulatory Visit
Admission: EM | Admit: 2023-01-11 | Discharge: 2023-01-11 | Disposition: A | Discharge status: Home / Self Care | Payer: Medicaid Other | Attending: Physician Assistant | Admitting: Physician Assistant

## 2023-01-11 ENCOUNTER — Other Ambulatory Visit: Payer: Self-pay

## 2023-01-11 ENCOUNTER — Encounter: Payer: Self-pay | Admitting: *Deleted

## 2023-01-11 DIAGNOSIS — N898 Other specified noninflammatory disorders of vagina: Secondary | ICD-10-CM | POA: Insufficient documentation

## 2023-01-11 HISTORY — DX: Multiple sclerosis, unspecified: G35.D

## 2023-01-11 HISTORY — DX: Multiple sclerosis: G35

## 2023-01-11 NOTE — ED Triage Notes (Signed)
Pt reports vaginal d/c with odor. Concerned for BV

## 2023-01-13 ENCOUNTER — Telehealth: Payer: Self-pay

## 2023-01-13 LAB — CERVICOVAGINAL ANCILLARY ONLY
Bacterial Vaginitis (gardnerella): POSITIVE — AB
Candida Glabrata: NEGATIVE
Candida Vaginitis: NEGATIVE
Chlamydia: NEGATIVE
Comment: NEGATIVE
Comment: NEGATIVE
Comment: NEGATIVE
Comment: NEGATIVE
Comment: NEGATIVE
Comment: NORMAL
Neisseria Gonorrhea: NEGATIVE
Trichomonas: NEGATIVE

## 2023-01-13 MED ORDER — FLUCONAZOLE 150 MG PO TABS: ORAL_TABLET | ORAL | 0 refills | Status: DC

## 2023-01-13 MED ORDER — METRONIDAZOLE 500 MG PO TABS: 500.0000 mg | ORAL_TABLET | Freq: Two times a day (BID) | ORAL | 0 refills | Status: AC

## 2023-01-13 NOTE — Telephone Encounter (Signed)
TC from pt, ID verified. Rx for BV per protocol to pharmacy. Rx for abx induced yeast infection per protocol sent to pharmacy.

## 2023-01-26 ENCOUNTER — Encounter: Payer: Self-pay | Admitting: Physician Assistant

## 2023-01-26 NOTE — ED Provider Notes (Signed)
[facility]    CSN: [id number] Arrival date & time: 01/11/23  1553      History   Chief Complaint Chief Complaint  Patient presents with   Vaginal Discharge    HPI Alicia Rojas is a 37 y.o. female.   Patient here today for evaluation of vaginal discharge.  She reports associated odor with discharge.  She denies any concerns for STD exposure.  The history is provided by the patient.  Vaginal Discharge Associated symptoms: no abdominal pain, no fever, no nausea and no vomiting     Past Medical History:  Diagnosis Date   Chlamydia    Gestational hypertension    MS (multiple sclerosis) (HCC)    No pertinent past medical history    Trichimoniasis     Patient Active Problem List   Diagnosis Date Noted   High risk medication use 04/03/2019   Right foot drop 04/03/2019   Numbness 04/03/2019   Hyperglycemia 03/28/2019   Leukocytosis 03/28/2019   Normocytic anemia 03/28/2019   Iron deficiency anemia 03/28/2019   Multiple sclerosis (HCC) 03/25/2019   Elevated blood pressure reading without diagnosis of hypertension 03/25/2019   Normal delivery 01/05/2011   Threatened preterm labor 12/22/2010    Past Surgical History:  Procedure Laterality Date   MULTIPLE TOOTH EXTRACTIONS      OB History     Gravida  4   Para  4   Term  4   Preterm  0   AB  0   Living  4      SAB  0   IAB  0   Ectopic  0   Multiple  0   Live Births  4            Home Medications    Prior to Admission medications   Medication Sig Start Date End Date Taking? Authorizing Provider  amLODipine (NORVASC) 5 MG tablet Take 1 tablet (5 mg total) by mouth daily. 06/26/19  Yes Sater, Pearletha Furl, MD  ocrelizumab (OCREVUS) 300 MG/10ML injection 600 mg IV every 6 months 06/13/19  Yes Sater, Pearletha Furl, MD  Vitamin D, Ergocalciferol, (DRISDOL) 1.25 MG (50000 UNIT) CAPS capsule Take 1 capsule (50,000 Units total) by mouth every 7 (seven) days. 09/27/22   Sater, Pearletha Furl,  MD  feeding supplement, ENSURE ENLIVE, (ENSURE ENLIVE) LIQD Take 237 mLs by mouth 2 (two) times daily between meals. 03/30/19   Marguerita Merles Latif, DO  fluconazole (DIFLUCAN) 150 MG tablet 150mg  po x 1 dose, repeat in 3 days if needed. 01/13/23   LampteyBritta Mccreedy, MD  iron polysaccharides (NIFEREX) 150 MG capsule Take 1 capsule (150 mg total) by mouth daily. 03/30/19   Marguerita Merles Latif, DO  Multiple Vitamin (MULTIVITAMIN WITH MINERALS) TABS tablet Take 1 tablet by mouth daily. 03/30/19   Sheikh, Omair Latif, DO  ondansetron (ZOFRAN) 4 MG tablet Take 1 tablet (4 mg total) by mouth every 6 (six) hours as needed for nausea. 03/30/19   Marguerita Merles Latif, DO  oxybutynin (DITROPAN) 5 MG tablet Take 1 tablet (5 mg total) by mouth 2 (two) times daily. 06/26/19   Sater, Pearletha Furl, MD    Family History Family History  Problem Relation Age of Onset   Hypertension Other     Social History Social History   Tobacco Use   Smoking status: Some Days    Types: Cigarettes   Smokeless tobacco: Never   Tobacco comments:    once a month  Vaping  Use   Vaping status: Never Used  Substance Use Topics   Alcohol use: Not Currently    Comment: occasional   Drug use: No     Allergies   Patient has no known allergies.   Review of Systems Review of Systems  Constitutional:  Negative for chills and fever.  Eyes:  Negative for discharge and redness.  Respiratory:  Negative for shortness of breath.   Gastrointestinal:  Negative for abdominal pain, nausea and vomiting.  Genitourinary:  Positive for vaginal discharge.     Physical Exam Triage Vital Signs ED Triage Vitals  Encounter Vitals Group     BP 01/11/23 1637 (!) 149/97     Systolic BP Percentile --      Diastolic BP Percentile --      Pulse Rate 01/11/23 1637 68     Resp 01/11/23 1637 18     Temp 01/11/23 1637 98.6 F (37 C)     Temp Source 01/11/23 1637 Oral     SpO2 01/11/23 1637 99 %     Weight --      Height --      Head  Circumference --      Peak Flow --      Pain Score 01/11/23 1635 0     Pain Loc --      Pain Education --      Exclude from Growth Chart --    No data found.  Updated Vital Signs BP (!) 149/97 (BP Location: Left Arm)   Pulse 68   Temp 98.6 F (37 C) (Oral)   Resp 18   LMP 12/28/2022   SpO2 99%       Physical Exam Vitals and nursing note reviewed.  Constitutional:      General: She is not in acute distress.    Appearance: Normal appearance. She is not ill-appearing.  HENT:     Head: Normocephalic and atraumatic.  Eyes:     Conjunctiva/sclera: Conjunctivae normal.  Cardiovascular:     Rate and Rhythm: Normal rate.  Pulmonary:     Effort: Pulmonary effort is normal. No respiratory distress.  Neurological:     Mental Status: She is alert.  Psychiatric:        Mood and Affect: Mood normal.        Behavior: Behavior normal.        Thought Content: Thought content normal.      UC Treatments / Results  Labs (all labs ordered are listed, but only abnormal results are displayed) Labs Reviewed  CERVICOVAGINAL ANCILLARY ONLY - Abnormal; Notable for the following components:      Result Value   Bacterial Vaginitis (gardnerella) Positive (*)    All other components within normal limits    EKG   Radiology No results found.  Procedures Procedures (including critical care time)  Medications Ordered in UC Medications - No data to display  Initial Impression / Assessment and Plan / UC Course  I have reviewed the triage vital signs and the nursing notes.  Pertinent labs & imaging results that were available during my care of the patient were reviewed by me and considered in my medical decision making (see chart for details).    STD screening ordered as well as screening for BV and yeast.  Will await results further recommendation.  Final Clinical Impressions(s) / UC Diagnoses   Final diagnoses:  Vaginal discharge   Discharge Instructions   None    ED  Prescriptions   None  PDMP not reviewed this encounter.   Tomi Bamberger, PA-C 01/26/23 [phone]

## 2023-01-31 ENCOUNTER — Telehealth: Payer: Medicaid Other | Admitting: Physician Assistant

## 2023-01-31 DIAGNOSIS — J208 Acute bronchitis due to other specified organisms: Secondary | ICD-10-CM

## 2023-01-31 DIAGNOSIS — B9689 Other specified bacterial agents as the cause of diseases classified elsewhere: Secondary | ICD-10-CM | POA: Diagnosis not present

## 2023-01-31 MED ORDER — AZITHROMYCIN 250 MG PO TABS: ORAL_TABLET | ORAL | 0 refills | Status: AC

## 2023-01-31 MED ORDER — PSEUDOEPH-BROMPHEN-DM 30-2-10 MG/5ML PO SYRP: 5.0000 mL | ORAL_SOLUTION | Freq: Four times a day (QID) | ORAL | 0 refills | Status: DC | PRN

## 2023-01-31 MED ORDER — PREDNISONE 20 MG PO TABS: 40.0000 mg | ORAL_TABLET | Freq: Every day | ORAL | 0 refills | Status: DC

## 2023-01-31 MED ORDER — ALBUTEROL SULFATE HFA 108 (90 BASE) MCG/ACT IN AERS: 1.0000 | INHALATION_SPRAY | Freq: Four times a day (QID) | RESPIRATORY_TRACT | 0 refills | Status: AC | PRN

## 2023-01-31 NOTE — Patient Instructions (Signed)
Alicia Rojas, thank you for joining Margaretann Loveless, PA-C for today's virtual visit.  While this provider is not your primary care provider (PCP), if your PCP is located in our provider database this encounter information will be shared with them immediately following your visit.   A [facility] MyChart account gives you access to today's visit and all your visits, tests, and labs performed at [facility] " click here if you don't have a [facility] MyChart account or go to [email or web address]  Consent: (Patient) Alicia Rojas provided verbal consent for this virtual visit at the beginning of the encounter.  Current Medications:  Current Outpatient Medications:    Vitamin D, Ergocalciferol, (DRISDOL) 1.25 MG (50000 UNIT) CAPS capsule, Take 1 capsule (50,000 Units total) by mouth every 7 (seven) days., Disp: 13 capsule, Rfl: 1   albuterol (VENTOLIN HFA) 108 (90 Base) MCG/ACT inhaler, Inhale 1-2 puffs into the lungs every 6 (six) hours as needed., Disp: 8 g, Rfl: 0   amLODipine (NORVASC) 5 MG tablet, Take 1 tablet (5 mg total) by mouth daily., Disp: 90 tablet, Rfl: 3   azithromycin (ZITHROMAX) 250 MG tablet, Take 2 tablets on day 1, then 1 tablet daily on days 2 through 5, Disp: 6 tablet, Rfl: 0   brompheniramine-pseudoephedrine-DM 30-2-10 MG/5ML syrup, Take 5 mLs by mouth 4 (four) times daily as needed., Disp: 120 mL, Rfl: 0   feeding supplement, ENSURE ENLIVE, (ENSURE ENLIVE) LIQD, Take 237 mLs by mouth 2 (two) times daily between meals., Disp: 237 mL, Rfl: 12   fluconazole (DIFLUCAN) 150 MG tablet, 150mg  po x 1 dose, repeat in 3 days if needed., Disp: 2 tablet, Rfl: 0   iron polysaccharides (NIFEREX) 150 MG capsule, Take 1 capsule (150 mg total) by mouth daily., Disp: 30 capsule, Rfl: 0   Multiple Vitamin (MULTIVITAMIN WITH MINERALS) TABS tablet, Take 1 tablet by mouth daily., Disp: 30 tablet, Rfl: 0   ocrelizumab (OCREVUS) 300 MG/10ML injection, 600 mg IV every 6  months, Disp: 20 mL, Rfl: 3   ondansetron (ZOFRAN) 4 MG tablet, Take 1 tablet (4 mg total) by mouth every 6 (six) hours as needed for nausea., Disp: 20 tablet, Rfl: 0   oxybutynin (DITROPAN) 5 MG tablet, Take 1 tablet (5 mg total) by mouth 2 (two) times daily., Disp: 60 tablet, Rfl: 11   predniSONE (DELTASONE) 20 MG tablet, Take 2 tablets (40 mg total) by mouth daily with breakfast., Disp: 10 tablet, Rfl: 0   Medications ordered in this encounter:  Meds ordered this encounter  Medications   azithromycin (ZITHROMAX) 250 MG tablet    Sig: Take 2 tablets on day 1, then 1 tablet daily on days 2 through 5    Dispense:  6 tablet    Refill:  0    Order Specific Question:   Supervising Provider    Answer:   Merrilee Jansky [[id number]]   predniSONE (DELTASONE) 20 MG tablet    Sig: Take 2 tablets (40 mg total) by mouth daily with breakfast.    Dispense:  10 tablet    Refill:  0    Order Specific Question:   Supervising Provider    Answer:   Merrilee Jansky [[id number]]   albuterol (VENTOLIN HFA) 108 (90 Base) MCG/ACT inhaler    Sig: Inhale 1-2 puffs into the lungs every 6 (six) hours as needed.    Dispense:  8 g    Refill:  0    Order Specific Question:   Supervising  Provider    Answer:   Merrilee Jansky [[id number]]   brompheniramine-pseudoephedrine-DM 30-2-10 MG/5ML syrup    Sig: Take 5 mLs by mouth 4 (four) times daily as needed.    Dispense:  120 mL    Refill:  0    Order Specific Question:   Supervising Provider    Answer:   Merrilee Jansky [[id number]]     *If you need refills on other medications prior to your next appointment, please contact your pharmacy*  Follow-Up: Call back or seek an in-person evaluation if the symptoms worsen or if the condition fails to improve as anticipated.  [facility] [phone]  Other Instructions Acute Bronchitis, Adult  Acute bronchitis is sudden inflammation of the main airways (bronchi) that come off the windpipe (trachea) in  the lungs. The swelling causes the airways to get smaller and make more mucus than normal. This can make it hard to breathe and can cause coughing or noisy breathing (wheezing). Acute bronchitis may last several weeks. The cough may last longer. Allergies, asthma, and exposure to smoke may make the condition worse. What are the causes? This condition can be caused by germs and by substances that irritate the lungs, including: Cold and flu viruses. The most common cause of this condition is the virus that causes the common cold. Bacteria. This is less common. Breathing in substances that irritate the lungs, including: Smoke from cigarettes and other forms of tobacco. Dust and pollen. Fumes from household cleaning products, gases, or burned fuel. Indoor or outdoor air pollution. What increases the risk? The following factors may make you more likely to develop this condition: A weak body's defense system, also called the immune system. A condition that affects your lungs and breathing, such as asthma. What are the signs or symptoms? Common symptoms of this condition include: Coughing. This may bring up clear, yellow, or green mucus from your lungs (sputum). Wheezing. Runny or stuffy nose. Having too much mucus in your lungs (chest congestion). Shortness of breath. Aches and pains, including sore throat or chest. How is this diagnosed? This condition is usually diagnosed based on: Your symptoms and medical history. A physical exam. You may also have other tests, including tests to rule out other conditions, such as pneumonia. These tests include: A test of lung function. Test of a mucus sample to look for the presence of bacteria. Tests to check the oxygen level in your blood. Blood tests. Chest X-ray. How is this treated? Most cases of acute bronchitis clear up over time without treatment. Your health care provider may recommend: Drinking more fluids to help thin your mucus so it is  easier to cough up. Taking inhaled medicine (inhaler) to improve air flow in and out of your lungs. Using a vaporizer or a humidifier. These are machines that add water to the air to help you breathe better. Taking a medicine that thins mucus and clears congestion (expectorant). Taking a medicine that prevents or stops coughing (cough suppressant). It is not common to take an antibiotic medicine for this condition. Follow these instructions at home:  Take over-the-counter and prescription medicines only as told by your health care provider. Use an inhaler, vaporizer, or humidifier as told by your health care provider. Take two teaspoons (10 mL) of honey at bedtime to lessen coughing at night. Drink enough fluid to keep your urine pale yellow. Do not use any products that contain nicotine or tobacco. These products include cigarettes, chewing tobacco, and vaping  devices, such as e-cigarettes. If you need help quitting, ask your health care provider. Get plenty of rest. Return to your normal activities as told by your health care provider. Ask your health care provider what activities are safe for you. Keep all follow-up visits. This is important. How is this prevented? To lower your risk of getting this condition again: Wash your hands often with soap and water for at least 20 seconds. If soap and water are not available, use hand sanitizer. Avoid contact with people who have cold symptoms. Try not to touch your mouth, nose, or eyes with your hands. Avoid breathing in smoke or chemical fumes. Breathing smoke or chemical fumes will make your condition worse. Get the flu shot every year. Contact a health care provider if: Your symptoms do not improve after 2 weeks. You have trouble coughing up the mucus. Your cough keeps you awake at night. You have a fever. Get help right away if you: Cough up blood. Feel pain in your chest. Have severe shortness of breath. Faint or keep feeling like  you are going to faint. Have a severe headache. Have a fever or chills that get worse. These symptoms may represent a serious problem that is an emergency. Do not wait to see if the symptoms will go away. Get medical help right away. Call your local emergency services (911 in the [place] Do not drive yourself to the hospital. Summary Acute bronchitis is inflammation of the main airways (bronchi) that come off the windpipe (trachea) in the lungs. The swelling causes the airways to get smaller and make more mucus than normal. Drinking more fluids can help thin your mucus so it is easier to cough up. Take over-the-counter and prescription medicines only as told by your health care provider. Do not use any products that contain nicotine or tobacco. These products include cigarettes, chewing tobacco, and vaping devices, such as e-cigarettes. If you need help quitting, ask your health care provider. Contact a health care provider if your symptoms do not improve after 2 weeks. This information is not intended to replace advice given to you by your health care provider. Make sure you discuss any questions you have with your health care provider. Document Revised: 06/24/2021 Document Reviewed: 07/15/2020 Elsevier Patient Education  2024 Elsevier Inc.    If you have been instructed to have an in-person evaluation today at a local Urgent Care facility, please use the link below. It will take you to a list of all of our available [facility], including address, phone number and hours of operation. Please do not delay care.  [facility]  If you or a family member do not have a primary care provider, use the link below to schedule a visit and establish care. When you choose a [facility] primary care physician or advanced practice provider, you gain a long-term partner in health. Find a Primary Care Provider  Learn more about [facility]'s in-office and virtual care options: [facility] - Get Care Now

## 2023-01-31 NOTE — Progress Notes (Signed)
Virtual Visit Consent   Alicia Rojas, you are scheduled for a virtual visit with a [facility] provider today. Just as with appointments in the office, your consent must be obtained to participate. Your consent will be active for this visit and any virtual visit you may have with one of our providers in the next 365 days. If you have a MyChart account, a copy of this consent can be sent to you electronically.  As this is a virtual visit, video technology does not allow for your provider to perform a traditional examination. This may limit your provider's ability to fully assess your condition. If your provider identifies any concerns that need to be evaluated in person or the need to arrange testing (such as labs, EKG, etc.), we will make arrangements to do so. Although advances in technology are sophisticated, we cannot ensure that it will always work on either your end or our end. If the connection with a video visit is poor, the visit may have to be switched to a telephone visit. With either a video or telephone visit, we are not always able to ensure that we have a secure connection.  By engaging in this virtual visit, you consent to the provision of healthcare and authorize for your insurance to be billed (if applicable) for the services provided during this visit. Depending on your insurance coverage, you may receive a charge related to this service.  I need to obtain your verbal consent now. Are you willing to proceed with your visit today? Alicia Rojas has provided verbal consent on 01/31/2023 for a virtual visit (video or telephone). Margaretann Loveless, PA-C  Date: 01/31/2023 1:38 PM  Virtual Visit via Video Note   I, Margaretann Loveless, connected with  Alicia Rojas  ([id number], February 03, 1986) on 01/31/23 at  1:30 PM EST by a video-enabled telemedicine application and verified that I am speaking with the correct person using two identifiers.  Location: Patient: Virtual Visit  Location Patient: Home Provider: Virtual Visit Location Provider: Home Office   I discussed the limitations of evaluation and management by telemedicine and the availability of in person appointments. The patient expressed understanding and agreed to proceed.    History of Present Illness: Alicia Rojas is a 37 y.o. who identifies as a female who was assigned female at birth, and is being seen today for cough.  HPI: Cough This is a new problem. The current episode started in the past 7 days (6 days). The problem has been gradually worsening. The problem occurs constantly. The cough is Productive of sputum and productive of purulent sputum. Associated symptoms include chest pain (tightness and burning), nasal congestion, postnasal drip and wheezing. Pertinent negatives include no chills, ear congestion, ear pain, fever, headaches, hemoptysis, rhinorrhea or sore throat. The symptoms are aggravated by lying down. Treatments tried: mucinex cold and flu. The treatment provided no relief. Her past medical history is significant for asthma and bronchitis. There is no history of environmental allergies or pneumonia.   Did have virtual appt with [facility] on 01/27/23 for same issue and diagnosed with Viral URI. Given Promethazine DM and told to use Flonase as well. No improvements.  Problems:  Patient Active Problem List   Diagnosis Date Noted   High risk medication use 04/03/2019   Right foot drop 04/03/2019   Numbness 04/03/2019   Hyperglycemia 03/28/2019   Leukocytosis 03/28/2019   Normocytic anemia 03/28/2019   Iron deficiency anemia 03/28/2019   Multiple sclerosis (HCC)  03/25/2019   Elevated blood pressure reading without diagnosis of hypertension 03/25/2019   Normal delivery 01/05/2011   Threatened preterm labor 12/22/2010    Allergies: No Known Allergies Medications:  Current Outpatient Medications:    Vitamin D, Ergocalciferol, (DRISDOL) 1.25 MG (50000 UNIT) CAPS capsule, Take  1 capsule (50,000 Units total) by mouth every 7 (seven) days., Disp: 13 capsule, Rfl: 1   albuterol (VENTOLIN HFA) 108 (90 Base) MCG/ACT inhaler, Inhale 1-2 puffs into the lungs every 6 (six) hours as needed., Disp: 8 g, Rfl: 0   amLODipine (NORVASC) 5 MG tablet, Take 1 tablet (5 mg total) by mouth daily., Disp: 90 tablet, Rfl: 3   azithromycin (ZITHROMAX) 250 MG tablet, Take 2 tablets on day 1, then 1 tablet daily on days 2 through 5, Disp: 6 tablet, Rfl: 0   brompheniramine-pseudoephedrine-DM 30-2-10 MG/5ML syrup, Take 5 mLs by mouth 4 (four) times daily as needed., Disp: 120 mL, Rfl: 0   feeding supplement, ENSURE ENLIVE, (ENSURE ENLIVE) LIQD, Take 237 mLs by mouth 2 (two) times daily between meals., Disp: 237 mL, Rfl: 12   fluconazole (DIFLUCAN) 150 MG tablet, 150mg  po x 1 dose, repeat in 3 days if needed., Disp: 2 tablet, Rfl: 0   iron polysaccharides (NIFEREX) 150 MG capsule, Take 1 capsule (150 mg total) by mouth daily., Disp: 30 capsule, Rfl: 0   Multiple Vitamin (MULTIVITAMIN WITH MINERALS) TABS tablet, Take 1 tablet by mouth daily., Disp: 30 tablet, Rfl: 0   ocrelizumab (OCREVUS) 300 MG/10ML injection, 600 mg IV every 6 months, Disp: 20 mL, Rfl: 3   ondansetron (ZOFRAN) 4 MG tablet, Take 1 tablet (4 mg total) by mouth every 6 (six) hours as needed for nausea., Disp: 20 tablet, Rfl: 0   oxybutynin (DITROPAN) 5 MG tablet, Take 1 tablet (5 mg total) by mouth 2 (two) times daily., Disp: 60 tablet, Rfl: 11   predniSONE (DELTASONE) 20 MG tablet, Take 2 tablets (40 mg total) by mouth daily with breakfast., Disp: 10 tablet, Rfl: 0  Observations/Objective: Patient is well-developed, well-nourished in no acute distress.  Resting comfortably at home.  Head is normocephalic, atraumatic.  No labored breathing.  Speech is clear and coherent with logical content.  Patient is alert and oriented at baseline.    Assessment and Plan: 1. Acute bacterial bronchitis - azithromycin (ZITHROMAX) 250 MG  tablet; Take 2 tablets on day 1, then 1 tablet daily on days 2 through 5  Dispense: 6 tablet; Refill: 0 - predniSONE (DELTASONE) 20 MG tablet; Take 2 tablets (40 mg total) by mouth daily with breakfast.  Dispense: 10 tablet; Refill: 0 - albuterol (VENTOLIN HFA) 108 (90 Base) MCG/ACT inhaler; Inhale 1-2 puffs into the lungs every 6 (six) hours as needed.  Dispense: 8 g; Refill: 0 - brompheniramine-pseudoephedrine-DM 30-2-10 MG/5ML syrup; Take 5 mLs by mouth 4 (four) times daily as needed.  Dispense: 120 mL; Refill: 0  - Worsening over a week despite OTC medications - Will treat with Z-pack, Prednisone, Albuterol and Bromfed DM - Can continue Mucinex  - Push fluids.  - Rest.  - Steam and humidifier can help - Seek in person evaluation if worsening or symptoms fail to improve    Follow Up Instructions: I discussed the assessment and treatment plan with the patient. The patient was provided an opportunity to ask questions and all were answered. The patient agreed with the plan and demonstrated an understanding of the instructions.  A copy of instructions were sent to the patient via MyChart  unless otherwise noted below.    The patient was advised to call back or seek an in-person evaluation if the symptoms worsen or if the condition fails to improve as anticipated.    Margaretann Loveless, PA-C

## 2023-02-06 ENCOUNTER — Ambulatory Visit: Payer: No Typology Code available for payment source | Admitting: Neurology

## 2023-02-14 ENCOUNTER — Encounter: Payer: Self-pay | Admitting: *Deleted

## 2023-02-16 ENCOUNTER — Ambulatory Visit
Admission: RE | Admit: 2023-02-16 | Discharge: 2023-02-16 | Disposition: A | Discharge status: Home / Self Care | Payer: No Typology Code available for payment source | Source: Ambulatory Visit | Attending: Neurology | Admitting: Neurology

## 2023-02-16 ENCOUNTER — Telehealth: Payer: Self-pay | Admitting: Neurology

## 2023-02-16 DIAGNOSIS — G35 Multiple sclerosis: Secondary | ICD-10-CM

## 2023-02-16 MED ORDER — GADOTERIDOL 279.3 MG/ML IV SOLN
14.0000 mL | Freq: Once | INTRAVENOUS | Status: AC | PRN
  Administered 2023-02-16: 13 mL via INTRAVENOUS

## 2023-02-16 NOTE — Telephone Encounter (Signed)
She is doing very well.  She denies any new neurologic symptoms.  She  tolerated the ocrelizumab well.  Her insurance denied Ocrevus and we are in the process of appealing.   She comes in today for the week 192 visit.  General physical examination was normal.  Neurologic examination showed mild reduced right vibration sensation in the toes with normal position sense.  She has noted cognitive fog that interferes with some of her duties.  She has urinary urgency with very rare incontinence if she cannot get to the bathroom in time.  She feels walking is doing well and she has no trouble keeping up with everybody else   EDSS is 2.0 (1 sensory; 1 bladder;  2 cerebral ;  Ambulatory 0)

## 2023-03-01 NOTE — Telephone Encounter (Signed)
[employer] received appeal consent and appeal sent. Waiting on determination.

## 2023-03-16 ENCOUNTER — Encounter (HOSPITAL_BASED_OUTPATIENT_CLINIC_OR_DEPARTMENT_OTHER): Payer: Self-pay | Admitting: Certified Nurse Midwife

## 2023-03-20 ENCOUNTER — Other Ambulatory Visit (HOSPITAL_COMMUNITY)
Admission: RE | Admit: 2023-03-20 | Discharge: 2023-03-20 | Disposition: A | Discharge status: Home / Self Care | Payer: Medicaid Other | Source: Ambulatory Visit | Attending: Certified Nurse Midwife | Admitting: Certified Nurse Midwife

## 2023-03-20 ENCOUNTER — Encounter (HOSPITAL_BASED_OUTPATIENT_CLINIC_OR_DEPARTMENT_OTHER): Payer: Self-pay | Admitting: Certified Nurse Midwife

## 2023-03-20 ENCOUNTER — Other Ambulatory Visit (HOSPITAL_BASED_OUTPATIENT_CLINIC_OR_DEPARTMENT_OTHER): Payer: Self-pay

## 2023-03-20 ENCOUNTER — Ambulatory Visit (HOSPITAL_BASED_OUTPATIENT_CLINIC_OR_DEPARTMENT_OTHER): Payer: Medicaid Other | Admitting: Certified Nurse Midwife

## 2023-03-20 VITALS — BP 170/100 | HR 73 | Ht 62.0 in | Wt 143.2 lb

## 2023-03-20 DIAGNOSIS — Z113 Encounter for screening for infections with a predominantly sexual mode of transmission: Secondary | ICD-10-CM | POA: Insufficient documentation

## 2023-03-20 DIAGNOSIS — Z124 Encounter for screening for malignant neoplasm of cervix: Secondary | ICD-10-CM | POA: Diagnosis present

## 2023-03-20 DIAGNOSIS — Z01419 Encounter for gynecological examination (general) (routine) without abnormal findings: Secondary | ICD-10-CM

## 2023-03-20 MED ORDER — FLUCONAZOLE 150 MG PO TABS: 150.0000 mg | ORAL_TABLET | ORAL | 12 refills | Status: DC | PRN

## 2023-03-20 MED ORDER — METRONIDAZOLE 500 MG PO TABS: 500.0000 mg | ORAL_TABLET | Freq: Two times a day (BID) | ORAL | 12 refills | Status: DC

## 2023-03-20 MED ORDER — METRONIDAZOLE 500 MG PO TABS
500.0000 mg | ORAL_TABLET | Freq: Two times a day (BID) | ORAL | 12 refills | Status: DC
  Filled 2023-03-20: qty 14, 7d supply, fill #0

## 2023-03-20 MED ORDER — FLUCONAZOLE 150 MG PO TABS
150.0000 mg | ORAL_TABLET | ORAL | 12 refills | Status: DC | PRN
  Filled 2023-03-20: qty 2, 4d supply, fill #0

## 2023-03-20 NOTE — Progress Notes (Unsigned)
  Pt would like permanent female sterilization.  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

## 2023-03-23 LAB — CYTOLOGY - PAP
Chlamydia: NEGATIVE
Comment: NEGATIVE
Comment: NEGATIVE
Comment: NEGATIVE
Comment: NORMAL
Diagnosis: NEGATIVE
Diagnosis: REACTIVE
High risk HPV: NEGATIVE
Neisseria Gonorrhea: NEGATIVE
Trichomonas: NEGATIVE

## 2023-04-12 ENCOUNTER — Other Ambulatory Visit: Payer: Self-pay | Admitting: Neurology

## 2023-04-12 DIAGNOSIS — G35 Multiple sclerosis: Secondary | ICD-10-CM

## 2023-04-19 ENCOUNTER — Ambulatory Visit (HOSPITAL_BASED_OUTPATIENT_CLINIC_OR_DEPARTMENT_OTHER): Payer: Medicaid Other | Admitting: Family Medicine

## 2023-04-19 ENCOUNTER — Encounter (HOSPITAL_BASED_OUTPATIENT_CLINIC_OR_DEPARTMENT_OTHER): Payer: Self-pay | Admitting: Family Medicine

## 2023-04-19 VITALS — BP 130/78 | HR 73 | Ht 62.0 in | Wt 143.8 lb

## 2023-04-19 DIAGNOSIS — I10 Essential (primary) hypertension: Secondary | ICD-10-CM | POA: Insufficient documentation

## 2023-04-19 DIAGNOSIS — R051 Acute cough: Secondary | ICD-10-CM

## 2023-04-19 DIAGNOSIS — S39012A Strain of muscle, fascia and tendon of lower back, initial encounter: Secondary | ICD-10-CM | POA: Diagnosis not present

## 2023-04-19 MED ORDER — METHOCARBAMOL 500 MG PO TABS: 500.0000 mg | ORAL_TABLET | Freq: Three times a day (TID) | ORAL | 0 refills | Status: AC | PRN

## 2023-04-19 MED ORDER — MELOXICAM 15 MG PO TABS: 15.0000 mg | ORAL_TABLET | Freq: Every day | ORAL | 0 refills | Status: DC | PRN

## 2023-04-19 MED ORDER — PROMETHAZINE-DM 6.25-15 MG/5ML PO SYRP: 5.0000 mL | ORAL_SOLUTION | Freq: Four times a day (QID) | ORAL | 0 refills | Status: DC | PRN

## 2023-04-19 NOTE — Progress Notes (Signed)
New Patient Office Visit  Subjective:   Alicia Rojas Jan 30, 1986 04/19/2023  Chief Complaint  Patient presents with   Back Pain    Pt. Coming in as a new pt. With concerns of lower back pain, elevated blood pressure and a cough. Pt. Stats that the back pain has been going on for about 2 weeks and the cough started 3 days ago.    Cough   Hypertension    Pt. States that she has not been taking B/P medications like she should and she noticed that the B/P has been elevated.     HPI: Alicia Rojas presents today to establish care at Primary Care and Sports Medicine at [facility]. Introduced to Publishing rights manager role and practice setting.  All questions answered.   Last PCP: [facility] Last annual physical: Over 1 year ago Concerns: See below   HYPERTENSION: [name] presents for the medical management of hypertension.  Patient's current hypertension medication regimen is: Amlodipine 5mg   Patient is  currently taking prescribed medications for HTN.  Patient is  regularly keeping a check on BP at home. She states last reading in the past week is 160/90's using wrist cuff at work.  Adhering to low sodium diet: Yes Exercising Regularly: No Denies headache, dizziness, CP, SHOB, vision changes.   BP Readings from Last 3 Encounters:  04/19/23 130/78  03/20/23 (!) 170/100  01/11/23 (!) 149/97    BACK PAIN:  Patient states new onset of lower back pain in the past 2 weeks. Reports intermittent pain, denies radiation of pain from lower back. Denies numbness, tingling weakness, saddle paresthesia, incontinence of bowel or bladder.  Denies injuries, recent falls or accidents.  She states flexing of lower back forward relieves pain. States heating pad has been helping.  She works at day program for disability. Denies heavy lifting, pushing pulling.    COUGH:  Reports cough ongoing for the past 3 days.    Fever: no Runny nose:  no Nasal congestion: Yes, reports mild runny nose  Sinus pressure: None Post nasal drip: Mild  Cough: yes, congested, mildly productive with green/yellow sputum Ear pain: no Sore throat: no  Denies current tobacco use. Does report hx of asthma as child. Denies SHOB, wheezing, dyspnea. Reports she has a tobacco user in house.   Treatments tried: Delsym, antihistamine   Recent sick contacts: None   The following portions of the patient's history were reviewed and updated as appropriate: past medical history, past surgical history, family history, social history, allergies, medications, and problem list.   Patient Active Problem List   Diagnosis Date Noted   Benign essential HTN 04/19/2023   High risk medication use 04/03/2019   Right foot drop 04/03/2019   Hyperglycemia 03/28/2019   Leukocytosis 03/28/2019   Normocytic anemia 03/28/2019   Iron deficiency anemia 03/28/2019   Multiple sclerosis (HCC) 03/25/2019   Past Medical History:  Diagnosis Date   Chlamydia    Gestational hypertension    MS (multiple sclerosis) (HCC)    No pertinent past medical history    Normal delivery 01/05/2011   Numbness 04/03/2019   Threatened preterm labor 12/22/2010   Trichimoniasis    Past Surgical History:  Procedure Laterality Date   MULTIPLE TOOTH EXTRACTIONS     Family History  Problem Relation Age of Onset   Hypertension Other    Social History   Socioeconomic History   Marital status: Single    Spouse name: Not  on file   Number of children: 4   Years of education: 12   Highest education level: Not on file  Occupational History   Occupation: After [employer]  Tobacco Use   Smoking status: Some Days    Types: Cigarettes   Smokeless tobacco: Never   Tobacco comments:    once a month  Vaping Use   Vaping status: Never Used  Substance and Sexual Activity   Alcohol use: Not Currently    Comment: occasional   Drug use: No   Sexual activity: Yes    Birth control/protection:  I.U.D.  Other Topics Concern   Not on file  Social History Narrative   Lives with her three kids   Caffeine use: no coffee, tea sometimes, 1-2 sodas per day   Right handed    Social Drivers of Health   Financial Resource Strain: Not on file  Food Insecurity: Medium Risk (01/09/2023)   Received from [facility]   Hunger Vital Sign    Worried About Running Out of Food in the Last Year: Sometimes true    Ran Out of Food in the Last Year: Never true  Transportation Needs: No Transportation Needs (01/09/2023)   Received from [facility]    In the past 12 months, has lack of reliable transportation kept you from medical appointments, meetings, work or from getting things needed for daily living? : No  Physical Activity: Not on file  Stress: Not on file  Social Connections: Not on file  Intimate Partner Violence: Not on file   Outpatient Medications Prior to Visit  Medication Sig Dispense Refill   albuterol (VENTOLIN HFA) 108 (90 Base) MCG/ACT inhaler Inhale 1-2 puffs into the lungs every 6 (six) hours as needed. 8 g 0   amLODipine (NORVASC) 5 MG tablet Take 1 tablet (5 mg total) by mouth daily. 90 tablet 3   Ocrelizumab (OCREVUS IV) Inject into the vein.     fluconazole (DIFLUCAN) 150 MG tablet Take 1 tablet (150 mg total) by mouth every other day as needed. Take once a month (Patient not taking: Reported on 04/19/2023) 2 tablet 12   metroNIDAZOLE (FLAGYL) 500 MG tablet Take 1 tablet (500 mg total) by mouth 2 (two) times daily. Take once a month after your period (Patient not taking: Reported on 04/19/2023) 14 tablet 12   No facility-administered medications prior to visit.   No Known Allergies  ROS: A complete ROS was performed with pertinent positives/negatives noted in the HPI. The remainder of the ROS are negative.   Objective:   Today's Vitals   04/19/23 1310 04/19/23 1330  BP: 127/81 130/78  Pulse: 73   SpO2: 99%   Weight: 143 lb 12.8 oz (65.2 kg)    Height: 5\' 2"  (1.575 m)   PainSc: 8    PainLoc: Back     GENERAL: Well-appearing, in NAD. Well nourished.  SKIN: Pink, warm and dry. No rash, lesion, ulceration, or ecchymoses.  Head: Normocephalic. NECK: Trachea midline. Full ROM w/o pain or tenderness. No lymphadenopathy.  EARS: Tympanic membranes are intact, translucent without bulging and without drainage. Appropriate landmarks visualized.  EYES: Conjunctiva clear without exudates. EOMI, PERRL, no drainage present.  NOSE: Septum midline w/o deformity. Nares patent, mucosa pink and non-inflamed w/o drainage. No sinus tenderness.  THROAT: Uvula midline. Oropharynx clear.  Mucous membranes pink and moist.  RESPIRATORY: Chest wall symmetrical. Respirations even and non-labored. Breath sounds clear to auscultation bilaterally.  CARDIAC: S1, S2 present, regular rate and  rhythm without murmur or gallops. Peripheral pulses 2+ bilaterally.  MSK: Muscle tone and strength appropriate for age. Joints w/o tenderness, redness, or swelling. No step off, deformity or crepitus to spine. Full ROM present. Negative straight leg raise testing. Mild muscle banding present to bilateral lower paraspinal areas of lumbar spine.  EXTREMITIES: Without clubbing, cyanosis, or edema.  NEUROLOGIC: No motor or sensory deficits. Steady, even gait. C2-C12 intact.  PSYCH/MENTAL STATUS: Alert, oriented x 3. Cooperative, appropriate mood and affect.    There are no preventive care reminders to display for this patient.  No results found for any visits on 04/19/23.     Assessment & Plan:  1. Acute cough (Primary) Discussed likely allergic in nature. Recommend use of Allegra, Zyrtec, Claritin and Flonase PRN. Use inhaler if needed and Promethazine DM as needed for cough. Follow up in 1 week or sooner if needed.   - promethazine-dextromethorphan (PROMETHAZINE-DM) 6.25-15 MG/5ML syrup; Take 5 mLs by mouth 4 (four) times daily as needed for cough.  Dispense: 118 mL;  Refill: 0  2. Strain of lumbar region, initial encounter Dicussed proper body mechanics and recommend use of Mobic 15mg  PRN for 2-3 weeks with food and Robaxin as needed for muscle spasm. Safe use of medication reviewed. Provided lumbar stretches to do 2-3 x per day and continue heating pad.  - methocarbamol (ROBAXIN) 500 MG tablet; Take 1 tablet (500 mg total) by mouth every 8 (eight) hours as needed for muscle spasms.  Dispense: 30 tablet; Refill: 0 - meloxicam (MOBIC) 15 MG tablet; Take 1 tablet (15 mg total) by mouth daily as needed (back pain).  Dispense: 30 tablet; Refill: 0  3. Benign essential HTN Well controlled w/ manual BP check. Pt to obtain arm automatic cuff to check BP regularly at home. Recommend monitoring salt and exercise regularly. Continue amlodipine 5mg  daily.    Patient to reach out to office if new, worrisome, or unresolved symptoms arise or if no improvement in patient's condition. Patient verbalized understanding and is agreeable to treatment plan. All questions answered to patient's satisfaction.    Return in about 3 months (around 07/18/2023) for ANNUAL PHYSICAL (fasting labs at visit) .    Hilbert Bible, [place]

## 2023-04-25 ENCOUNTER — Other Ambulatory Visit (HOSPITAL_BASED_OUTPATIENT_CLINIC_OR_DEPARTMENT_OTHER): Payer: Self-pay | Admitting: Certified Nurse Midwife

## 2023-04-25 ENCOUNTER — Encounter (HOSPITAL_BASED_OUTPATIENT_CLINIC_OR_DEPARTMENT_OTHER): Payer: Self-pay | Admitting: Certified Nurse Midwife

## 2023-04-25 DIAGNOSIS — B9689 Other specified bacterial agents as the cause of diseases classified elsewhere: Secondary | ICD-10-CM

## 2023-04-25 MED ORDER — FLUCONAZOLE 150 MG PO TABS: 150.0000 mg | ORAL_TABLET | ORAL | 2 refills | Status: DC | PRN

## 2023-04-25 MED ORDER — METRONIDAZOLE 500 MG PO TABS: 500.0000 mg | ORAL_TABLET | Freq: Two times a day (BID) | ORAL | 3 refills | Status: DC

## 2023-05-12 ENCOUNTER — Other Ambulatory Visit (HOSPITAL_BASED_OUTPATIENT_CLINIC_OR_DEPARTMENT_OTHER): Payer: Self-pay | Admitting: Family Medicine

## 2023-05-12 DIAGNOSIS — R051 Acute cough: Secondary | ICD-10-CM

## 2023-05-13 ENCOUNTER — Other Ambulatory Visit (HOSPITAL_BASED_OUTPATIENT_CLINIC_OR_DEPARTMENT_OTHER): Payer: Self-pay | Admitting: Family Medicine

## 2023-05-13 DIAGNOSIS — S39012A Strain of muscle, fascia and tendon of lower back, initial encounter: Secondary | ICD-10-CM

## 2023-05-15 MED ORDER — PROMETHAZINE-DM 6.25-15 MG/5ML PO SYRP: 5.0000 mL | ORAL_SOLUTION | Freq: Four times a day (QID) | ORAL | 0 refills | Status: DC | PRN

## 2023-05-16 ENCOUNTER — Encounter: Payer: Self-pay | Admitting: Neurology

## 2023-05-18 ENCOUNTER — Encounter: Payer: Self-pay | Admitting: Neurology

## 2023-05-21 ENCOUNTER — Ambulatory Visit
Admission: RE | Admit: 2023-05-21 | Discharge: 2023-05-21 | Disposition: A | Discharge status: Home / Self Care | Payer: Medicaid Other | Source: Ambulatory Visit | Attending: Neurology | Admitting: Neurology

## 2023-05-21 DIAGNOSIS — G35 Multiple sclerosis: Secondary | ICD-10-CM

## 2023-05-21 MED ORDER — GADOPICLENOL 0.5 MMOL/ML IV SOLN
7.5000 mL | Freq: Once | INTRAVENOUS | Status: AC | PRN
  Administered 2023-05-21: 7.5 mL via INTRAVENOUS

## 2023-07-04 ENCOUNTER — Encounter (HOSPITAL_BASED_OUTPATIENT_CLINIC_OR_DEPARTMENT_OTHER): Payer: Self-pay | Admitting: Family Medicine

## 2023-07-04 NOTE — Telephone Encounter (Signed)
 Spoke with patient she will let us know if she can be worked in at [employer]

## 2023-07-17 ENCOUNTER — Ambulatory Visit: Payer: No Typology Code available for payment source | Admitting: Neurology

## 2023-07-17 ENCOUNTER — Encounter: Payer: Self-pay | Admitting: Neurology

## 2023-07-17 VITALS — BP 135/80 | HR 70 | Ht 62.0 in | Wt 145.4 lb

## 2023-07-17 DIAGNOSIS — G35 Multiple sclerosis: Secondary | ICD-10-CM

## 2023-07-17 DIAGNOSIS — Z79899 Other long term (current) drug therapy: Secondary | ICD-10-CM | POA: Diagnosis not present

## 2023-07-17 DIAGNOSIS — M21371 Foot drop, right foot: Secondary | ICD-10-CM

## 2023-07-17 NOTE — Progress Notes (Signed)
 [facility]  PATIENT: Alicia Rojas DOB: 22-Oct-1985  REFERRING DOCTOR OR PCP: Dr. Roscoe Compton Aroor SOURCE: Patient, notes from recent hospitalization, imaging and laboratory reports, MRI images personally reviewed.  _________________________________   HISTORICAL  CHIEF COMPLAINT:  Chief Complaint  Patient presents with   Follow-up    RM 10, alone. Last seen 09/26/22. MS DMT: Ocrevus . No new sx, concerns. Last eye doctor appt: when she was child. Recommended she scheduled yearly eye appointments.     HISTORY OF PRESENT ILLNESS:  Alicia Rojas is a 38 y.o. woman withr elapsing remitting multiple sclerosis.  Update 07/17/2023 She has been on Ocrevus  since 05/2019 as part of the CHIMES MS study (minorities open label Ocrevus ) and had her commercial dose last week.   She tolerates it well.   She is near the end of the study.     Currently, she feels balace is good and strength is good - however, if she walks longer distance, she sometimes has some right foot weakness.  Keeps up with others for short distance, not always longer distance.   She uses bannister going downstairs.  She only has minimal numbness in legs now.   She has urinary frequency  but no incontinence.   Vision is fine.   No diplopia.    She notes some fatigue but less than a couple years ago and recently had a physically active vacation.  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    She sleeps well.  She denies any problems with mood .  Minimal reduced cognition with some forgetfulness.  . These are all stable   MS history: Around 2015, she had numbness in the right face that lasted about 2 weeks and completely resolved.  In 2016,  she had numbness and clumsiness in the right hand for 2-3 weeks.  Symptoms again resolved.    She noted difficulty with picking up her right leg as she walked around Christmas 2020.   She was stumbling but had no falls.  She also noted swelling and pain in the  right ankle.   She had numbness in the right foot.   She did not note any symptoms on the left side of her body but there was also a little bit of clumsiness in the right hand.  She presented to the emergency room at [facility].  MRI was consistent with MS and she was admitted for IV steroids.   I saw her in early 2021.   She was enrolled into the CHIMES minorities open label Ocrevus  study.     Data: MRI brain 05/21/2023 showed T2 hyperintense foci within the cerebral hemispheres, cerebellum and brainstem in a pattern consistent with chronic demyelinating plaque associated with multiple sclerosis. None of the foci enhanced or appear to be acute. Compared to the MRI from 02/16/2023, there were no new lesions.   MRI brain 06/09/2019, 11/29/2019, 05/13/2019, 04/07/2921 and 03/18/2022 were all unchanged.    MRI of the brain 03/26/2019 showed multiple T2/flair hyperintense foci in the periventricular, juxtacortical and deep white matter.  One focus in the left parietal deep white matter enhanced after contrast administration.  MRI of the cervical spine performed 03/26/2019 showed multiple T2 hyperintense foci within the spinal cord.  These are located to the left at C1-C2, to the right at C2-C3, posteriorly to the left at C3, posteriorly bilaterally at C4, anteriorly to the left at C5, anteriorly to the right at C5, posterolaterally to the right  at C5-C6, to the left at C6-C7, and to the right at C7-T1.  2 foci, one anteriorly on the right and another posterior laterally to the left enhanced after contrast.  There are degenerative changes with bilateral disc osteophyte complexes at C4-C5 and disc protrusion at C5-C6.  MRI of the thoracic spine 03/26/2019 showed foci posteriorly to the left adjacent to C3-C4, and centrally adjacent to T7 and T9.  None of the foci enhance after contrast.    Her mother and (maternal) aunt both had multiple sclerosis.   Her mom died of complications from MS in 2015.   She was  diagnosed in the mid 1990's.      Laboratory tests: During her hospital admission 03/26/2019 through 03/30/2019, she had lab work including neuromyelitis optica antibody which was negative.  Vitamin B12 was negative.  She had mild anemia with increased RDW and was found to have low iron .  Ferritin was 4.  Lymphocyte counts were normal.  Liver function tests were fine.  REVIEW OF SYSTEMS: Constitutional: No fevers, chills, sweats, or change in appetite Eyes: No visual changes, double vision, eye pain Ear, nose and throat: No hearing loss, ear pain, nasal congestion, sore throat Cardiovascular: No chest pain, palpitations Respiratory:  No shortness of breath at rest or with exertion.   No wheezes GastrointestinaI: No nausea, vomiting, diarrhea, abdominal pain, fecal incontinence Genitourinary:  No dysuria, urinary retention or frequency.  No nocturia. Musculoskeletal:  No neck pain, back pain Integumentary: No rash, pruritus, skin lesions Neurological: as above Psychiatric: No depression at this time.  No anxiety Endocrine: No palpitations, diaphoresis, change in appetite, change in weigh or increased thirst Hematologic/Lymphatic:  No anemia, purpura, petechiae. Allergic/Immunologic: No itchy/runny eyes, nasal congestion, recent allergic reactions, rashes  ALLERGIES: No Known Allergies  HOME MEDICATIONS:  Current Outpatient Medications:    albuterol  (VENTOLIN  HFA) 108 (90 Base) MCG/ACT inhaler, Inhale 1-2 puffs into the lungs every 6 (six) hours as needed., Disp: 8 g, Rfl: 0   amLODipine  (NORVASC ) 5 MG tablet, Take 1 tablet (5 mg total) by mouth daily., Disp: 90 tablet, Rfl: 3   meloxicam  (MOBIC ) 15 MG tablet, TAKE 1 TABLET (15 MG TOTAL) BY MOUTH DAILY AS NEEDED (BACK PAIN)., Disp: 30 tablet, Rfl: 0   methocarbamol  (ROBAXIN ) 500 MG tablet, Take 1 tablet (500 mg total) by mouth every 8 (eight) hours as needed for muscle spasms., Disp: 30 tablet, Rfl: 0   metroNIDAZOLE  (FLAGYL ) 500 MG tablet,  Take 1 tablet (500 mg total) by mouth 2 (two) times daily., Disp: 14 tablet, Rfl: 3   Ocrelizumab  (OCREVUS  IV), Inject 600 mg into the vein every 6 (six) months., Disp: , Rfl:   PAST MEDICAL HISTORY: Past Medical History:  Diagnosis Date   Chlamydia    Gestational hypertension    MS (multiple sclerosis) (HCC)    No pertinent past medical history    Normal delivery 01/05/2011   Numbness 04/03/2019   Threatened preterm labor 12/22/2010   Trichimoniasis     PAST SURGICAL HISTORY: Past Surgical History:  Procedure Laterality Date   MULTIPLE TOOTH EXTRACTIONS      FAMILY HISTORY: Family History  Problem Relation Age of Onset   Hypertension Other     SOCIAL HISTORY:  Social History   Socioeconomic History   Marital status: Single    Spouse name: Not on file   Number of children: 4   Years of education: 12   Highest education level: Not on file  Occupational History  Occupation: After [employer]  Tobacco Use   Smoking status: Some Days    Types: Cigarettes   Smokeless tobacco: Never   Tobacco comments:    once a month  Vaping Use   Vaping status: Never Used  Substance and Sexual Activity   Alcohol use: Not Currently    Comment: occasional   Drug use: No   Sexual activity: Yes    Birth control/protection: I.U.D.  Other Topics Concern   Not on file  Social History Narrative   Lives with her three kids   Caffeine use: no coffee, tea sometimes, 1-2 sodas per day   Right handed    Social Drivers of Health   Financial Resource Strain: Not on file  Food Insecurity: Medium Risk (01/09/2023)   Received from [facility]   Hunger Vital Sign    Worried About Running Out of Food in the Last Year: Sometimes true    Ran Out of Food in the Last Year: Never true  Transportation Needs: No Transportation Needs (01/09/2023)   Received from [facility]    In the past 12 months, has lack of reliable transportation kept you from medical appointments,  meetings, work or from getting things needed for daily living? : No  Physical Activity: Not on file  Stress: Not on file  Social Connections: Not on file  Intimate Partner Violence: Not on file     PHYSICAL EXAM  Vitals:   07/17/23 1339  BP: 135/80  Pulse: 70  SpO2: 99%  Weight: 145 lb 6.4 oz (66 kg)  Height: 5\' 2"  (1.575 m)     Body mass index is 26.59 kg/m.   General: The patient is well-developed and well-nourished and in no acute distress  HEENT:  Head is [place]/AT.  Sclera are anicteric.  Funduscopic exam shows normal optic discs and retinal vessels.   Skin: Extremities are without rash or  edema.  Musculoskeletal:  Back is nontender  Neurologic Exam  Mental status: The patient is alert and oriented x 3 at the time of the examination. The patient has apparent normal recent and remote memory, with an apparently normal attention span and concentration ability.   Speech is normal.  Cranial nerves: Extraocular movements are full. Facial strength and sensation are normal.  No dysarthria.  No obvious hearing deficits are noted.  Motor:  Muscle bulk is normal.   Tone is normal.  Strength is  5 / 5 in the arms and legs.   Sensory: Sensory testing is intact to pinprick, soft touch and vibration sensation in the arms but she has mild reduced vibration sensation in the right leg..  Coordination: Cerebellar testing reveals good finger-nose-finger and heel-to-shin bilaterally.  Gait and station: Station is normal.   She has a normal gait.  Tandem gait is mildly wide.  Romberg is negative.   Reflexes: Deep tendon reflexes are i normal   DIAGNOSTIC DATA (LABS, IMAGING, TESTING) - I reviewed patient records, labs, notes, testing and imaging myself where available.  Lab Results  Component Value Date   WBC 20.7 (H) 03/30/2019   HGB 10.5 (L) 03/30/2019   HCT 32.6 (L) 03/30/2019   MCV 83.6 03/30/2019   PLT 331 03/30/2019      Component Value Date/Time   NA 138 03/30/2019  0401   K 3.8 03/30/2019 0401   CL 100 03/30/2019 0401   CO2 25 03/30/2019 0401   GLUCOSE 126 (H) 03/30/2019 0401   BUN 13 03/30/2019 0401   CREATININE  0.58 03/30/2019 0401   CALCIUM 8.8 (L) 03/30/2019 0401   PROT 5.8 (L) 03/30/2019 0401   ALBUMIN 3.3 (L) 03/30/2019 0401   AST 21 03/30/2019 0401   ALT 33 03/30/2019 0401   ALKPHOS 48 03/30/2019 0401   BILITOT 0.3 03/30/2019 0401   GFRNONAA >60 03/30/2019 0401   GFRAA >60 03/30/2019 0401   No results found for: "CHOL", "HDL", "LDLCALC", "LDLDIRECT", "TRIG", "CHOLHDL" Lab Results  Component Value Date   HGBA1C 5.5 03/28/2019   Lab Results  Component Value Date   VITAMINB12 297 03/28/2019   No results found for: "TSH"     ASSESSMENT AND PLAN  Multiple sclerosis (HCC) - Plan: IgG, IgA, IgM, CBC with Differential/Platelet  High risk medication use - Plan: IgG, IgA, IgM, CBC with Differential/Platelet  Right foot drop  Continue Ocrevus .   Check labs Consider oxybutynin  for bladder. If worsens Stay active and exercise as needed Rtc 7 months for infusion Nadim Malia A. Godwin Lat, MD, [facility] 07/17/2023, 2:35 PM Certified in Neurology, Clinical Neurophysiology, Sleep Medicine and Neuroimaging  [facility] [address], [address] [place], [place] [address] [phone]

## 2023-07-18 ENCOUNTER — Encounter: Payer: Self-pay | Admitting: Neurology

## 2023-07-18 ENCOUNTER — Encounter (HOSPITAL_BASED_OUTPATIENT_CLINIC_OR_DEPARTMENT_OTHER): Payer: Medicaid Other | Admitting: Family Medicine

## 2023-07-18 LAB — IGG, IGA, IGM
IgA/Immunoglobulin A, Serum: 88 mg/dL (ref 87–352)
IgG (Immunoglobin G), Serum: 927 mg/dL (ref 586–1602)
IgM (Immunoglobulin M), Srm: 78 mg/dL (ref 26–217)

## 2023-07-18 LAB — CBC WITH DIFFERENTIAL/PLATELET
Basophils Absolute: 0.1 x10E3/uL (ref 0.0–0.2)
Basos: 1 %
EOS (ABSOLUTE): 0.3 x10E3/uL (ref 0.0–0.4)
Eos: 3 %
Hematocrit: 39.4 % (ref 34.0–46.6)
Hemoglobin: 12.5 g/dL (ref 11.1–15.9)
Immature Grans (Abs): 0 x10E3/uL (ref 0.0–0.1)
Immature Granulocytes: 0 %
Lymphocytes Absolute: 1.9 x10E3/uL (ref 0.7–3.1)
Lymphs: 22 %
MCH: 26.3 pg — ABNORMAL LOW (ref 26.6–33.0)
MCHC: 31.7 g/dL (ref 31.5–35.7)
MCV: 83 fL (ref 79–97)
Monocytes Absolute: 1.2 x10E3/uL — ABNORMAL HIGH (ref 0.1–0.9)
Monocytes: 14 %
Neutrophils Absolute: 5.1 x10E3/uL (ref 1.4–7.0)
Neutrophils: 60 %
Platelets: 379 x10E3/uL (ref 150–450)
RBC: 4.75 x10E6/uL (ref 3.77–5.28)
RDW: 17.5 % — ABNORMAL HIGH (ref 11.7–15.4)
WBC: 8.6 x10E3/uL (ref 3.4–10.8)

## 2023-08-07 ENCOUNTER — Encounter: Payer: Self-pay | Admitting: *Deleted

## 2023-08-07 NOTE — Telephone Encounter (Signed)
 Spoke w/ MD, he approved writing letter for pt.

## 2023-08-31 ENCOUNTER — Telehealth: Payer: Self-pay | Admitting: Neurology

## 2023-08-31 NOTE — Telephone Encounter (Signed)
 At 4:59 pm Alicia Rojas ,[place] specialist w/ [facility] called asking about the denial letter re: Ocrevus .  Phone rep attempted to clarify what it is that Alicia Rojas was asking for and she became very unprofessional in rudely asking for 1st name, last name 1st initial and title, when phone rep provided job title she then said "thought so" and quickly said she would just call back, she declined a message being sent to RN or an option of speaking with Medical records re: the letter she was asking about, she said she will f/u with insurance company and ended the call.

## 2023-09-05 NOTE — Telephone Encounter (Signed)
 I will pass this information along to [facility].

## 2023-09-05 NOTE — Telephone Encounter (Signed)
 Linda from [facility] informed that a new PA is needled for pt medication . Stana Ear informed that if Pa is not place within 10 days they will dismiss Pt orders for medication.

## 2023-10-02 ENCOUNTER — Encounter (HOSPITAL_BASED_OUTPATIENT_CLINIC_OR_DEPARTMENT_OTHER): Payer: Self-pay | Admitting: Family Medicine

## 2023-10-02 ENCOUNTER — Ambulatory Visit (INDEPENDENT_AMBULATORY_CARE_PROVIDER_SITE_OTHER): Admitting: Family Medicine

## 2023-10-02 VITALS — BP 150/90 | HR 72 | Ht 62.0 in | Wt 147.5 lb

## 2023-10-02 DIAGNOSIS — Z13220 Encounter for screening for lipoid disorders: Secondary | ICD-10-CM

## 2023-10-02 DIAGNOSIS — G35 Multiple sclerosis: Secondary | ICD-10-CM | POA: Diagnosis not present

## 2023-10-02 DIAGNOSIS — I10 Essential (primary) hypertension: Secondary | ICD-10-CM | POA: Diagnosis not present

## 2023-10-02 DIAGNOSIS — Z0000 Encounter for general adult medical examination without abnormal findings: Secondary | ICD-10-CM

## 2023-10-02 NOTE — Patient Instructions (Signed)

## 2023-10-02 NOTE — Progress Notes (Signed)
 Subjective:   Alicia Rojas July 21, 1985  10/02/2023   CC: Chief Complaint  Patient presents with   Annual Exam    Patient is here today for her physical. Denies any main concerns for today's visit.    HPI: Alicia Rojas is a 38 y.o. female who presents for a routine health maintenance exam.  Labs collected at time of visit.    HEALTH SCREENINGS: - Vision Screening: not applicable - Dental Visits: Recommended- she has an appt in 2 weeks  - Pap smear: pap done - Breast Exam: Declined - STD Screening: Declined  - Mammogram (40+): Not applicable  - Colonoscopy (45+): Not applicable  - Bone Density (65+ or under 65 with predisposing conditions): Not applicable  - Lung CA screening with low-dose CT:  Not applicable Adults age 53-80 who are current cigarette smokers or quit within the last 15 years. Must have 20 pack year history.   Depression and Anxiety Screen done today and results listed below:     10/02/2023    1:28 PM 04/19/2023    1:20 PM 03/20/2023    1:26 PM 03/16/2023    3:30 PM  Depression screen PHQ 2/9  Decreased Interest 0 1 0 0  Down, Depressed, Hopeless 0 1 0 0  PHQ - 2 Score 0 2 0 0  Altered sleeping 0 0    Tired, decreased energy 0 1    Change in appetite 0 1    Feeling bad or failure about yourself  0 1    Trouble concentrating 2 2    Moving slowly or fidgety/restless 0 0    Suicidal thoughts 0 0    PHQ-9 Score 2 7    Difficult doing work/chores Not difficult at all Somewhat difficult        10/02/2023    1:28 PM 04/19/2023    1:21 PM  GAD 7 : Generalized Anxiety Score  Nervous, Anxious, on Edge 0 1  Control/stop worrying 0 1  Worry too much - different things 0 1  Trouble relaxing 0 0  Restless 0 0  Easily annoyed or irritable 0 1  Afraid - awful might happen 0 0  Total GAD 7 Score 0 4  Anxiety Difficulty Not difficult at all Not difficult at all    IMMUNIZATIONS: - Tdap: Tetanus vaccination status reviewed: Declined. - HPV:  Recommended - Influenza: Postponed to flu season - Pneumovax: Not applicable - Prevnar 20: Not applicable - Shingrix (50+): Not applicable   Past medical history, surgical history, medications, allergies, family history and social history reviewed with patient today and changes made to appropriate areas of the chart.   Past Medical History:  Diagnosis Date   Chlamydia    Gestational hypertension    MS (multiple sclerosis) (HCC)    No pertinent past medical history    Normal delivery 01/05/2011   Numbness 04/03/2019   Threatened preterm labor 12/22/2010   Trichimoniasis     Past Surgical History:  Procedure Laterality Date   MULTIPLE TOOTH EXTRACTIONS      Current Outpatient Medications on File Prior to Visit  Medication Sig   albuterol  (VENTOLIN  HFA) 108 (90 Base) MCG/ACT inhaler Inhale 1-2 puffs into the lungs every 6 (six) hours as needed.   amLODipine  (NORVASC ) 5 MG tablet Take 1 tablet (5 mg total) by mouth daily.   fluconazole  (DIFLUCAN ) 150 MG tablet Take 150 mg by mouth every other day as needed.   meloxicam  (MOBIC ) 15 MG tablet TAKE 1  TABLET (15 MG TOTAL) BY MOUTH DAILY AS NEEDED (BACK PAIN).   methocarbamol  (ROBAXIN ) 500 MG tablet Take 1 tablet (500 mg total) by mouth every 8 (eight) hours as needed for muscle spasms.   metroNIDAZOLE  (FLAGYL ) 500 MG tablet Take 1 tablet (500 mg total) by mouth 2 (two) times daily.   Ocrelizumab  (OCREVUS  IV) Inject 600 mg into the vein every 6 (six) months.   No current facility-administered medications on file prior to visit.    No Known Allergies   Social History   Socioeconomic History   Marital status: Single    Spouse name: Not on file   Number of children: 4   Years of education: 12   Highest education level: Not on file  Occupational History   Occupation: After [employer]  Tobacco Use   Smoking status: Some Days    Types: Cigarettes   Smokeless tobacco: Never   Tobacco comments:    once a month  Vaping Use    Vaping status: Never Used  Substance and Sexual Activity   Alcohol use: Not Currently    Comment: occasional   Drug use: No   Sexual activity: Yes    Birth control/protection: I.U.D.  Other Topics Concern   Not on file  Social History Narrative   Lives with her three kids   Caffeine use: no coffee, tea sometimes, 1-2 sodas per day   Right handed    Social Drivers of Health   Financial Resource Strain: Not on file  Food Insecurity: Medium Risk (01/09/2023)   Received from [facility]   Hunger Vital Sign    Within the past 12 months, you worried that your food would run out before you got money to buy more: Sometimes true    Within the past 12 months, the food you bought just didn't last and you didn't have money to get more. : Never true  Transportation Needs: No Transportation Needs (01/09/2023)   Received from [facility]    In the past 12 months, has lack of reliable transportation kept you from medical appointments, meetings, work or from getting things needed for daily living? : No  Physical Activity: Not on file  Stress: Not on file  Social Connections: Not on file  Intimate Partner Violence: Not on file   Social History   Tobacco Use  Smoking Status Some Days   Types: Cigarettes  Smokeless Tobacco Never  Tobacco Comments   once a month   Social History   Substance and Sexual Activity  Alcohol Use Not Currently   Comment: occasional    Family History  Problem Relation Age of Onset   Multiple sclerosis Mother    Hypertension Other      ROS: Denies fever, fatigue, unexplained weight loss/gain, chest pain, SHOB, and palpitations. Denies neurological deficits, gastrointestinal or genitourinary complaints, and skin changes.   Objective:   Today's Vitals   10/02/23 1324 10/02/23 1347  BP: (!) 154/94 (!) 150/90  Pulse: 72   SpO2: 100%   Weight: 147 lb 8 oz (66.9 kg)   Height: 5' 2 (1.575 m)     GENERAL APPEARANCE: Well-appearing,  in NAD. Well nourished.  SKIN: Pink, warm and dry. Turgor normal. No rash, lesion, ulceration, or ecchymoses. Hair evenly distributed.  HEENT: HEAD: Normocephalic.  EYES: PERRLA. EOMI. Lids intact w/o defect. Sclera white, Conjunctiva pink w/o exudate.  EARS: External ear w/o redness, swelling, masses or lesions. EAC clear. TM's intact, translucent w/o bulging, appropriate landmarks visualized.  Appropriate acuity to conversational tones.  NOSE: Septum midline w/o deformity. Nares patent, mucosa pink and non-inflamed w/o drainage. No sinus tenderness.  THROAT: Uvula midline. Oropharynx clear. Tonsils non-inflamed w/o exudate. Oral mucosa pink and moist.  NECK: Supple, Trachea midline. Full ROM w/o pain or tenderness. No lymphadenopathy. Thyroid non-tender w/o enlargement or palpable masses.  BREASTS: Breasts pendulous, symmetrical, and w/o palpable masses. Nipples everted and w/o discharge. No rash or skin retraction. No axillary or supraclavicular lymphadenopathy.  RESPIRATORY: Chest wall symmetrical w/o masses. Respirations even and non-labored. Breath sounds clear to auscultation bilaterally. No wheezes, rales, rhonchi, or crackles. CARDIAC: S1, S2 present, regular rate and rhythm. No gallops, murmurs, rubs, or clicks. PMI w/o lifts, heaves, or thrills. No carotid bruits. Capillary refill <2 seconds. Peripheral pulses 2+ bilaterally. GI: Abdomen soft w/o distention. Normoactive bowel sounds. No palpable masses or tenderness. No guarding or rebound tenderness. Liver and spleen w/o tenderness or enlargement. No CVA tenderness.  GU: External genitalia without erythema, lesions, or masses. No lymphadenopathy. Vaginal mucosa pink and moist without exudate, lesions, or ulcerations. Cervix pink without discharge. Cervical os closed. Uterus and adnexae palpable, not enlarged, and w/o tenderness. No palpable masses.  MSK: Muscle tone and strength appropriate for age, w/o atrophy or abnormal movement.   EXTREMITIES: Active ROM intact, w/o tenderness, crepitus, or contracture. No obvious joint deformities or effusions. No clubbing, edema, or cyanosis.  NEUROLOGIC: CN's II-XII intact. Motor strength symmetrical with no obvious weakness. No sensory deficits. DTR's 2+ symmetric bilaterally. Steady, even gait.  PSYCH/MENTAL STATUS: Alert, oriented x 3. Cooperative, appropriate mood and affect.   Chaperoned by Damien Many, CMA   Results for orders placed or performed in visit on 07/17/23  IgG, IgA, IgM   Collection Time: 07/17/23  2:25 PM  Result Value Ref Range   IgG (Immunoglobin G), Serum 927 586 - 1,602 mg/dL   IgA/Immunoglobulin A, Serum 88 87 - 352 mg/dL   IgM (Immunoglobulin M), Srm 78 26 - 217 mg/dL  CBC with Differential/Platelet   Collection Time: 07/17/23  2:25 PM  Result Value Ref Range   WBC 8.6 3.4 - 10.8 x10E3/uL   RBC 4.75 3.77 - 5.28 x10E6/uL   Hemoglobin 12.5 11.1 - 15.9 g/dL   Hematocrit 60.5 65.9 - 46.6 %   MCV 83 79 - 97 fL   MCH 26.3 (L) 26.6 - 33.0 pg   MCHC 31.7 31.5 - 35.7 g/dL   RDW 82.4 (H) 88.2 - 84.5 %   Platelets 379 150 - 450 x10E3/uL   Neutrophils 60 Not Estab. %   Lymphs 22 Not Estab. %   Monocytes 14 Not Estab. %   Eos 3 Not Estab. %   Basos 1 Not Estab. %   Neutrophils Absolute 5.1 1.4 - 7.0 x10E3/uL   Lymphocytes Absolute 1.9 0.7 - 3.1 x10E3/uL   Monocytes Absolute 1.2 (H) 0.1 - 0.9 x10E3/uL   EOS (ABSOLUTE) 0.3 0.0 - 0.4 x10E3/uL   Basophils Absolute 0.1 0.0 - 0.2 x10E3/uL   Immature Granulocytes 0 Not Estab. %   Immature Grans (Abs) 0.0 0.0 - 0.1 x10E3/uL    Assessment & Plan:  1. Annual physical exam (Primary) Doing well overall. Discussed preventative screenings, vaccines, and screenings. Will start mammo at 38 yo. Will obtain labs with AE today. Discussed healthy lifestyle.  - Comprehensive metabolic panel with GFR - Lipid panel  2. Screening for lipid disorders - Lipid panel  3. Benign essential HTN Controllled with BP of  120-130/70's at home per  patient. She did not take medication today. She will return for BP check only in 1-2 weeks. Discussed importance of taking medication and monitoring BP.   4. Multiple sclerosis (HCC) Controlled currently with Q29month infusions with Ocrevus . Follow up in November 2025 with Neurology. Recent CBC reviewed.    Orders Placed This Encounter  Procedures   Comprehensive metabolic panel with GFR   Lipid panel    PATIENT COUNSELING:  - Encouraged a healthy well-balanced diet. Patient may adjust caloric intake to maintain or achieve ideal body weight. May reduce intake of dietary saturated fat and total fat and have adequate dietary potassium and calcium preferably from fresh fruits, vegetables, and low-fat dairy products.   - Advised to avoid cigarette smoking. - Discussed with the patient that most people either abstain from alcohol or drink within safe limits (<=14/week and <=4 drinks/occasion for males, <=7/weeks and <= 3 drinks/occasion for females) and that the risk for alcohol disorders and other health effects rises proportionally with the number of drinks per week and how often a drinker exceeds daily limits. - Discussed cessation/primary prevention of drug use and availability of treatment for abuse.  - Discussed sexually transmitted diseases, avoidance of unintended pregnancy and contraceptive alternatives.  - Stressed the importance of regular exercise - Injury prevention: Discussed safety belts, safety helmets, smoke detector, smoking near bedding or upholstery.  - Dental health: Discussed importance of regular tooth brushing, flossing, and dental visits.   NEXT PREVENTATIVE PHYSICAL DUE IN 1 YEAR.  Return for 1-2 weeks BP Check only; 1 year AE (labs at visit) .  Patient to reach out to office if new, worrisome, or unresolved symptoms arise or if no improvement in patient's condition. Patient verbalized understanding and is agreeable to treatment plan. All  questions answered to patient's satisfaction.    Thersia Schuyler Stark, [place]

## 2023-10-03 ENCOUNTER — Ambulatory Visit (HOSPITAL_BASED_OUTPATIENT_CLINIC_OR_DEPARTMENT_OTHER): Payer: Self-pay | Admitting: Family Medicine

## 2023-10-03 LAB — LIPID PANEL
Chol/HDL Ratio: 2.4 ratio (ref 0.0–4.4)
Cholesterol, Total: 124 mg/dL (ref 100–199)
HDL: 52 mg/dL (ref >39)
LDL Chol Calc (NIH): 59 mg/dL (ref 0–99)
Triglycerides: 60 mg/dL (ref 0–149)
VLDL Cholesterol Cal: 13 mg/dL (ref 5–40)

## 2023-10-03 LAB — COMPREHENSIVE METABOLIC PANEL WITH GFR
ALT: 25 IU/L (ref 0–32)
AST: 22 IU/L (ref 0–40)
Albumin: 4.4 g/dL (ref 3.9–4.9)
Alkaline Phosphatase: 57 IU/L (ref 44–121)
BUN/Creatinine Ratio: 10 (ref 9–23)
BUN: 7 mg/dL (ref 6–20)
Bilirubin Total: 0.4 mg/dL (ref 0.0–1.2)
CO2: 23 mmol/L (ref 20–29)
Calcium: 9.1 mg/dL (ref 8.7–10.2)
Chloride: 106 mmol/L (ref 96–106)
Creatinine, Ser: 0.72 mg/dL (ref 0.57–1.00)
Globulin, Total: 2 g/dL (ref 1.5–4.5)
Glucose: 97 mg/dL (ref 70–99)
Potassium: 4.5 mmol/L (ref 3.5–5.2)
Sodium: 143 mmol/L (ref 134–144)
Total Protein: 6.4 g/dL (ref 6.0–8.5)
eGFR: 110 mL/min/1.73 (ref >59)

## 2023-10-03 NOTE — Progress Notes (Signed)
 Hi Alicia Rojas, Your electrolytes, kidney, and liver function look great.  Your protein has improved from last check as well.  Your cholesterol is well-controlled.  We will plan on repeating these in 1 year.

## 2023-10-03 NOTE — Addendum Note (Signed)
 Addended by: Corney Knighton on: 10/03/2023 03:50 PM   Modules accepted: Level of Service

## 2023-10-18 ENCOUNTER — Ambulatory Visit (HOSPITAL_BASED_OUTPATIENT_CLINIC_OR_DEPARTMENT_OTHER)

## 2023-12-19 ENCOUNTER — Encounter (HOSPITAL_BASED_OUTPATIENT_CLINIC_OR_DEPARTMENT_OTHER): Payer: Self-pay | Admitting: Certified Nurse Midwife

## 2023-12-20 ENCOUNTER — Ambulatory Visit (HOSPITAL_BASED_OUTPATIENT_CLINIC_OR_DEPARTMENT_OTHER): Admitting: Certified Nurse Midwife

## 2023-12-20 ENCOUNTER — Encounter (HOSPITAL_BASED_OUTPATIENT_CLINIC_OR_DEPARTMENT_OTHER): Payer: Self-pay | Admitting: Certified Nurse Midwife

## 2023-12-20 VITALS — BP 135/80 | HR 78 | Ht 61.0 in | Wt 150.0 lb

## 2023-12-20 DIAGNOSIS — B3731 Acute candidiasis of vulva and vagina: Secondary | ICD-10-CM

## 2023-12-20 MED ORDER — FLUCONAZOLE 150 MG PO TABS: 150.0000 mg | ORAL_TABLET | ORAL | 12 refills | Status: DC | PRN

## 2023-12-20 MED ORDER — PROGESTERONE 200 MG PO CAPS: 200.0000 mg | ORAL_CAPSULE | Freq: Every day | ORAL | 12 refills | Status: AC

## 2023-12-20 NOTE — Progress Notes (Signed)
 Subjective:     Alicia Rojas is a 38 y.o. female who presents for vulvar yeast infection. Pt states she recently took a course of antibiotics and then developed ext vulvar itching and swelling. Pt feels certain this is a yeast infection. She has no concern for STD and no desire for STD testing. BP doing much better now that she is taking her medication as directed. Pap Smear UTD (03/20/23) Negative.  Pt has Mirena IUD and has irregular spotting/bleeding. Today she wanted to know if bilateral salpingectomy would 'stop' her period. CNM explained that periods would continue after bilateral salpingectomy.    The following portions of the patient's history were reviewed and updated as appropriate: allergies, current medications, past family history, past medical history, past social history, past surgical history, and problem list.   Review of Systems Pertinent items are noted in HPI.    Objective:    BP 135/80   Pulse 78   Ht 5' 1 (1.549 m) Comment: Reported  Wt 150 lb (68 kg)   LMP 12/19/2023   BMI 28.34 kg/m  General appearance: alert, cooperative, and appears stated age    Assessment:    Monilial vulvo-vaginitis.    Plan:    Educational materials distributed. Oral antifungal see orders.  Arland MARLA Roller

## 2023-12-20 NOTE — Addendum Note (Signed)
 Addended byBETHA TAD RACE on: 12/20/2023 12:28 PM   Modules accepted: Orders

## 2024-02-20 ENCOUNTER — Encounter: Payer: Self-pay | Admitting: Neurology

## 2024-02-20 ENCOUNTER — Ambulatory Visit (INDEPENDENT_AMBULATORY_CARE_PROVIDER_SITE_OTHER): Admitting: Neurology

## 2024-02-20 VITALS — BP 168/120 | HR 86 | Ht 61.0 in | Wt 156.0 lb

## 2024-02-20 DIAGNOSIS — Z79899 Other long term (current) drug therapy: Secondary | ICD-10-CM | POA: Diagnosis not present

## 2024-02-20 DIAGNOSIS — G35A Relapsing-remitting multiple sclerosis: Secondary | ICD-10-CM

## 2024-02-20 DIAGNOSIS — M21371 Foot drop, right foot: Secondary | ICD-10-CM

## 2024-02-20 DIAGNOSIS — E559 Vitamin D deficiency, unspecified: Secondary | ICD-10-CM | POA: Diagnosis not present

## 2024-02-20 DIAGNOSIS — R200 Anesthesia of skin: Secondary | ICD-10-CM

## 2024-02-20 NOTE — Progress Notes (Signed)
 [facility]  PATIENT: Alicia Rojas DOB: 1985/09/26  REFERRING DOCTOR OR PCP: Dr. Eligio Aroor SOURCE: Patient, notes from recent hospitalization, imaging and laboratory reports, MRI images personally reviewed.  _________________________________   HISTORICAL  CHIEF COMPLAINT:  Chief Complaint  Patient presents with   Multiple Sclerosis    Rm11, alone, Ms follow up and right foot drop    HISTORY OF PRESENT ILLNESS:  Alicia Rojas Patient is a 38 y.o. woman withr elapsing remitting multiple sclerosis.  Update 02/20/2024 She has been on Ocrevus  since 05/2019 as part of the CHIMES MS study (minorities open label Ocrevus ) and had her commercial dose last week.   She tolerates it well.   She is now on commercial drug and next dose is in March 2026  Currently, Gait is ok but balance is mildly off.    If she walks longer distance, she sometimes has some right foot weakness.  She had one episode with spasms in her right hand.    She uses bannister going downstairs - she two steps on way down for safety.  She only has minimal numbness in legs now.   She has urinary frequency  but no incontinence.   Vision is fine.   No diplopia.    She has some fatigue but this is stable.  She is looking forward to some vacation soon  She sleeps well.  She denies any problems with mood .  Minimal reduced cognition with some forgetfulness. Rarely has troubl coming up with the right word or says the wrong word.  . These are all stable  Np more LBP or sciatica type symptoms.     MS history: Around 2015, she had numbness in the right face that lasted about 2 weeks and completely resolved.  In 2016,  she had numbness and clumsiness in the right hand for 2-3 weeks.  Symptoms again resolved.    She noted difficulty with picking up her right leg as she walked around Christmas 2020.   She was stumbling but had no falls.  She also noted swelling and pain in the right ankle.   She had numbness in the  right foot.   She did not note any symptoms on the left side of her body but there was also a little bit of clumsiness in the right hand.  She presented to the emergency room at [facility].  MRI was consistent with MS and she was admitted for IV steroids.   I saw her in early 2021.   She was enrolled into the CHIMES minorities open label Ocrevus  study.     Data: MRI brain 05/21/2023 showed T2 hyperintense foci within the cerebral hemispheres, cerebellum and brainstem in a pattern consistent with chronic demyelinating plaque associated with multiple sclerosis. None of the foci enhanced or appear to be acute. Compared to the MRI from 02/16/2023, there were no new lesions.   MRI brain 06/09/2019, 11/29/2019, 05/13/2019, 04/07/2921 and 03/18/2022 were all unchanged.    MRI of the brain 03/26/2019 showed multiple T2/flair hyperintense foci in the periventricular, juxtacortical and deep white matter.  One focus in the left parietal deep white matter enhanced after contrast administration.  MRI of the cervical spine performed 03/26/2019 showed multiple T2 hyperintense foci within the spinal cord.  These are located to the left at C1-C2, to the right at C2-C3, posteriorly to the left at C3, posteriorly bilaterally at C4, anteriorly to the left at C5, anteriorly to the right at C5, posterolaterally to the  right at C5-C6, to the left at C6-C7, and to the right at C7-T1.  2 foci, one anteriorly on the right and another posterior laterally to the left enhanced after contrast.  There are degenerative changes with bilateral disc osteophyte complexes at C4-C5 and disc protrusion at C5-C6.  MRI of the thoracic spine 03/26/2019 showed foci posteriorly to the left adjacent to C3-C4, and centrally adjacent to T7 and T9.  None of the foci enhance after contrast.   Her mother and (maternal) aunt both had multiple sclerosis.   Her mom died of complications from MS in 2015.   She was diagnosed in the mid 1990's.      Laboratory  tests: During her hospital admission 03/26/2019 through 03/30/2019, she had lab work including neuromyelitis optica antibody which was negative.  Vitamin B12 was negative.  She had mild anemia with increased RDW and was found to have low iron .  Ferritin was 4.  Lymphocyte counts were normal.  Liver function tests were fine.  REVIEW OF SYSTEMS: Constitutional: No fevers, chills, sweats, or change in appetite Eyes: No visual changes, double vision, eye pain Ear, nose and throat: No hearing loss, ear pain, nasal congestion, sore throat Cardiovascular: No chest pain, palpitations Respiratory:  No shortness of breath at rest or with exertion.   No wheezes GastrointestinaI: No nausea, vomiting, diarrhea, abdominal pain, fecal incontinence Genitourinary:  No dysuria, urinary retention or frequency.  No nocturia. Musculoskeletal:  No neck pain, back pain Integumentary: No rash, pruritus, skin lesions Neurological: as above Psychiatric: No depression at this time.  No anxiety Endocrine: No palpitations, diaphoresis, change in appetite, change in weigh or increased thirst Hematologic/Lymphatic:  No anemia, purpura, petechiae. Allergic/Immunologic: No itchy/runny eyes, nasal congestion, recent allergic reactions, rashes  ALLERGIES: No Known Allergies  HOME MEDICATIONS:  Current Outpatient Medications:    albuterol  (VENTOLIN  HFA) 108 (90 Base) MCG/ACT inhaler, Inhale 1-2 puffs into the lungs every 6 (six) hours as needed., Disp: 8 g, Rfl: 0   amLODipine  (NORVASC ) 5 MG tablet, Take 1 tablet (5 mg total) by mouth daily., Disp: 90 tablet, Rfl: 3   fluconazole  (DIFLUCAN ) 150 MG tablet, Take 1 tablet (150 mg total) by mouth every other day as needed., Disp: 2 tablet, Rfl: 12   meloxicam  (MOBIC ) 15 MG tablet, TAKE 1 TABLET (15 MG TOTAL) BY MOUTH DAILY AS NEEDED (BACK PAIN)., Disp: 30 tablet, Rfl: 0   methocarbamol  (ROBAXIN ) 500 MG tablet, Take 1 tablet (500 mg total) by mouth every 8 (eight) hours as needed  for muscle spasms., Disp: 30 tablet, Rfl: 0   metroNIDAZOLE  (FLAGYL ) 500 MG tablet, Take 1 tablet (500 mg total) by mouth 2 (two) times daily., Disp: 14 tablet, Rfl: 3   Ocrelizumab  (OCREVUS  IV), Inject 600 mg into the vein every 6 (six) months., Disp: , Rfl:    progesterone  (PROMETRIUM ) 200 MG capsule, Take 1 capsule (200 mg total) by mouth at bedtime., Disp: 30 capsule, Rfl: 12  PAST MEDICAL HISTORY: Past Medical History:  Diagnosis Date   Chlamydia    Gestational hypertension    MS (multiple sclerosis)    No pertinent past medical history    Normal delivery 01/05/2011   Numbness 04/03/2019   Threatened preterm labor 12/22/2010   Trichimoniasis     PAST SURGICAL HISTORY: Past Surgical History:  Procedure Laterality Date   MULTIPLE TOOTH EXTRACTIONS      FAMILY HISTORY: Family History  Problem Relation Age of Onset   Multiple sclerosis Mother    Hypertension  Other     SOCIAL HISTORY:  Social History   Socioeconomic History   Marital status: Single    Spouse name: Not on file   Number of children: 4   Years of education: 12   Highest education level: Not on file  Occupational History   Occupation: After [employer]  Tobacco Use   Smoking status: Some Days    Types: Cigarettes   Smokeless tobacco: Never   Tobacco comments:    once a month  Vaping Use   Vaping status: Never Used  Substance and Sexual Activity   Alcohol use: Not Currently    Comment: occasional   Drug use: No   Sexual activity: Yes    Birth control/protection: I.U.D.  Other Topics Concern   Not on file  Social History Narrative   Lives with her three kids   Caffeine use: no coffee, tea sometimes, 1-2 sodas per day   Right handed    Social Drivers of Health   Financial Resource Strain: Not on file  Food Insecurity: Medium Risk (01/09/2023)   Received from [facility]   Hunger Vital Sign    Within the past 12 months, you worried that your food would run out before you got money to buy  more: Sometimes true    Within the past 12 months, the food you bought just didn't last and you didn't have money to get more. : Never true  Transportation Needs: No Transportation Needs (01/09/2023)   Received from [facility]    In the past 12 months, has lack of reliable transportation kept you from medical appointments, meetings, work or from getting things needed for daily living? : No  Physical Activity: Not on file  Stress: Not on file  Social Connections: Not on file  Intimate Partner Violence: Not on file     PHYSICAL EXAM  Vitals:   02/20/24 1521 02/20/24 1527  BP: (!) 177/93 (!) 168/120  Pulse: 86   Weight: 156 lb (70.8 kg)   Height: 5' 1 (1.549 m)      Body mass index is 29.48 kg/m.   General: The patient is well-developed and well-nourished and in no acute distress  HEENT:  Head is [place]/AT.  Sclera are anicteric.   Skin: Extremities are without rash or  edema.  Musculoskeletal:  Back is nontender  Neurologic Exam  Mental status: The patient is alert and oriented x 3 at the time of the examination. The patient has apparent normal recent and remote memory, with an apparently normal attention span and concentration ability.   Speech is normal.  Cranial nerves: Extraocular movements are full. Facial strength and sensation are normal.  No dysarthria.  No obvious hearing deficits are noted.  Motor:  Muscle bulk is normal.   Tone is normal.  Strength is  5 / 5 in the arms and legs.    Sensory: Sensory testing is intact to pinprick, soft touch and vibration sensation in the arms but she has mild reduced vibration sensation in the right leg..  Coordination: Cerebellar testing reveals good finger-nose-finger and heel-to-shin bilaterally.  Gait and station: Station is normal.   Gait is normal.  Tandem gait is mildly wide..  Romberg is negative.   Reflexes: Deep tendon reflexes are normal    DIAGNOSTIC DATA (LABS, IMAGING, TESTING) - I  reviewed patient records, labs, notes, testing and imaging myself where available.  Lab Results  Component Value Date   WBC 8.6 07/17/2023   HGB 12.5  07/17/2023   HCT 39.4 07/17/2023   MCV 83 07/17/2023   PLT 379 07/17/2023      Component Value Date/Time   NA 143 10/02/2023 1351   K 4.5 10/02/2023 1351   CL 106 10/02/2023 1351   CO2 23 10/02/2023 1351   GLUCOSE 97 10/02/2023 1351   GLUCOSE 126 (H) 03/30/2019 0401   BUN 7 10/02/2023 1351   CREATININE 0.72 10/02/2023 1351   CALCIUM 9.1 10/02/2023 1351   PROT 6.4 10/02/2023 1351   ALBUMIN 4.4 10/02/2023 1351   AST 22 10/02/2023 1351   ALT 25 10/02/2023 1351   ALKPHOS 57 10/02/2023 1351   BILITOT 0.4 10/02/2023 1351   GFRNONAA >60 03/30/2019 0401   GFRAA >60 03/30/2019 0401   Lab Results  Component Value Date   CHOL 124 10/02/2023   HDL 52 10/02/2023   LDLCALC 59 10/02/2023   TRIG 60 10/02/2023   CHOLHDL 2.4 10/02/2023   Lab Results  Component Value Date   HGBA1C 5.5 03/28/2019   Lab Results  Component Value Date   VITAMINB12 297 03/28/2019   No results found for: TSH     ASSESSMENT AND PLAN  Multiple sclerosis, relapsing-remitting - Plan: IgG, IgA, IgM, CBC with Differential/Platelet  High risk medication use - Plan: IgG, IgA, IgM, CBC with Differential/Platelet  Vitamin D  deficiency - Plan: VITAMIN D  25 Hydroxy (Vit-D Deficiency, Fractures)  Right foot drop  Numbness  Continue Ocrevus .   Check CBC with differential and IgG/IgM today.  Will also check vitamin D  as this has been low in the past and she is not currently on supplements Stay active and exercise as needed Rtc 7 months for infusion Luanne Krzyzanowski A. Vear, MD, [facility] 02/20/2024, 3:57 PM Certified in Neurology, Clinical Neurophysiology, Sleep Medicine and Neuroimaging  [facility] [address], [address] [place], [place] [address] [phone]

## 2024-02-21 ENCOUNTER — Other Ambulatory Visit (HOSPITAL_BASED_OUTPATIENT_CLINIC_OR_DEPARTMENT_OTHER): Payer: Self-pay

## 2024-02-21 ENCOUNTER — Ambulatory Visit: Payer: Self-pay | Admitting: Neurology

## 2024-02-21 LAB — IGG, IGA, IGM
IgG (Immunoglobin G), Serum: 896 mg/dL (ref 586–1602)
IgM (Immunoglobulin M), Srm: 66 mg/dL (ref 26–217)
Immunoglobulin A, (IgA) QN, Serum: 86 mg/dL — ABNORMAL LOW (ref 87–352)

## 2024-02-21 LAB — CBC WITH DIFFERENTIAL/PLATELET
Basophils Absolute: 0.1 x10E3/uL (ref 0.0–0.2)
Basos: 1 %
EOS (ABSOLUTE): 0.3 x10E3/uL (ref 0.0–0.4)
Eos: 3 %
Hematocrit: 38.8 % (ref 34.0–46.6)
Hemoglobin: 12.4 g/dL (ref 11.1–15.9)
Immature Grans (Abs): 0 x10E3/uL (ref 0.0–0.1)
Immature Granulocytes: 0 %
Lymphocytes Absolute: 2.4 x10E3/uL (ref 0.7–3.1)
Lymphs: 25 %
MCH: 27.9 pg (ref 26.6–33.0)
MCHC: 32 g/dL (ref 31.5–35.7)
MCV: 87 fL (ref 79–97)
Monocytes Absolute: 1.2 x10E3/uL — ABNORMAL HIGH (ref 0.1–0.9)
Monocytes: 12 %
Neutrophils Absolute: 5.8 x10E3/uL (ref 1.4–7.0)
Neutrophils: 59 %
Platelets: 437 x10E3/uL (ref 150–450)
RBC: 4.45 x10E6/uL (ref 3.77–5.28)
RDW: 15.6 % — ABNORMAL HIGH (ref 11.7–15.4)
WBC: 9.7 x10E3/uL (ref 3.4–10.8)

## 2024-02-21 LAB — VITAMIN D 25 HYDROXY (VIT D DEFICIENCY, FRACTURES): Vit D, 25-Hydroxy: 5.9 ng/mL — ABNORMAL LOW (ref 30.0–100.0)

## 2024-02-21 MED ORDER — VITAMIN D (ERGOCALCIFEROL) 1.25 MG (50000 UNIT) PO CAPS
50000.0000 [IU] | ORAL_CAPSULE | ORAL | 3 refills | Status: AC
  Filled 2024-02-21: qty 12, 84d supply, fill #0

## 2024-04-02 ENCOUNTER — Encounter (HOSPITAL_BASED_OUTPATIENT_CLINIC_OR_DEPARTMENT_OTHER): Payer: Self-pay | Admitting: Certified Nurse Midwife

## 2024-04-04 ENCOUNTER — Other Ambulatory Visit (HOSPITAL_BASED_OUTPATIENT_CLINIC_OR_DEPARTMENT_OTHER): Payer: Self-pay

## 2024-04-04 ENCOUNTER — Other Ambulatory Visit (HOSPITAL_COMMUNITY)
Admission: RE | Admit: 2024-04-04 | Discharge: 2024-04-04 | Disposition: A | Source: Ambulatory Visit | Attending: Obstetrics and Gynecology | Admitting: Obstetrics and Gynecology

## 2024-04-04 ENCOUNTER — Encounter (HOSPITAL_BASED_OUTPATIENT_CLINIC_OR_DEPARTMENT_OTHER): Payer: Self-pay

## 2024-04-04 ENCOUNTER — Ambulatory Visit (HOSPITAL_BASED_OUTPATIENT_CLINIC_OR_DEPARTMENT_OTHER): Payer: Self-pay

## 2024-04-04 VITALS — BP 168/90 | HR 71 | Wt 152.6 lb

## 2024-04-04 DIAGNOSIS — N898 Other specified noninflammatory disorders of vagina: Secondary | ICD-10-CM

## 2024-04-04 MED ORDER — FLUCONAZOLE 150 MG PO TABS
150.0000 mg | ORAL_TABLET | Freq: Once | ORAL | 0 refills | Status: AC
  Filled 2024-04-04: qty 1, 1d supply, fill #0

## 2024-04-04 NOTE — Progress Notes (Signed)
 NURSE VISIT- VAGINITIS/STD/POC  SUBJECTIVE:  Alicia Rojas is a 39 y.o. [id number] GYN patientfemale here for a vaginal swab for vaginitis screening.  She reports the following symptoms: discharge described as white and curd-like and odor for 3 days.Denies abnormal vaginal bleeding, significant pelvic pain, fever, or UTI symptoms.  OBJECTIVE:  Appears well, in no apparent distress.  ASSESSMENT: Vaginal swab for vaginitis screening  PLAN: Self-collected vaginal probe for Bacterial Vaginosis, Yeast sent to lab Treatment: to be determined once results are received Follow-up as needed if symptoms persist/worsen, or new symptoms develop  Morna LOISE Quale, RN

## 2024-04-08 ENCOUNTER — Ambulatory Visit: Payer: Self-pay | Admitting: Obstetrics and Gynecology

## 2024-04-08 ENCOUNTER — Other Ambulatory Visit (HOSPITAL_BASED_OUTPATIENT_CLINIC_OR_DEPARTMENT_OTHER): Payer: Self-pay

## 2024-04-08 LAB — CERVICOVAGINAL ANCILLARY ONLY
Bacterial Vaginitis (gardnerella): POSITIVE — AB
Candida Glabrata: NEGATIVE
Candida Vaginitis: NEGATIVE
Comment: NEGATIVE
Comment: NEGATIVE
Comment: NEGATIVE

## 2024-04-08 MED ORDER — METRONIDAZOLE 500 MG PO TABS
500.0000 mg | ORAL_TABLET | Freq: Two times a day (BID) | ORAL | 0 refills | Status: AC
  Filled 2024-04-08: qty 14, 7d supply, fill #0

## 2024-04-25 ENCOUNTER — Other Ambulatory Visit (HOSPITAL_BASED_OUTPATIENT_CLINIC_OR_DEPARTMENT_OTHER): Payer: Self-pay

## 2024-04-25 ENCOUNTER — Other Ambulatory Visit (HOSPITAL_COMMUNITY): Payer: Self-pay

## 2024-04-25 ENCOUNTER — Encounter (HOSPITAL_BASED_OUTPATIENT_CLINIC_OR_DEPARTMENT_OTHER): Payer: Self-pay

## 2024-04-25 ENCOUNTER — Ambulatory Visit (HOSPITAL_BASED_OUTPATIENT_CLINIC_OR_DEPARTMENT_OTHER)

## 2024-04-25 VITALS — BP 168/106 | HR 73 | Wt 158.6 lb

## 2024-04-25 DIAGNOSIS — R399 Unspecified symptoms and signs involving the genitourinary system: Secondary | ICD-10-CM

## 2024-04-25 DIAGNOSIS — R300 Dysuria: Secondary | ICD-10-CM

## 2024-04-25 DIAGNOSIS — R3915 Urgency of urination: Secondary | ICD-10-CM

## 2024-04-25 DIAGNOSIS — R350 Frequency of micturition: Secondary | ICD-10-CM

## 2024-04-25 LAB — POCT URINALYSIS DIP (CLINITEK)
Bilirubin, UA: NEGATIVE
Glucose, UA: NEGATIVE mg/dL
Ketones, POC UA: NEGATIVE mg/dL
Nitrite, UA: NEGATIVE
POC PROTEIN,UA: 30 — AB
Spec Grav, UA: 1.025
Urobilinogen, UA: 4 U/dL — AB
pH, UA: 7

## 2024-04-25 MED ORDER — NITROFURANTOIN MONOHYD MACRO 100 MG PO CAPS
100.0000 mg | ORAL_CAPSULE | Freq: Two times a day (BID) | ORAL | 0 refills | Status: AC
  Filled 2024-04-25: qty 14, 7d supply, fill #0

## 2024-04-25 NOTE — Progress Notes (Signed)
 NURSE VISIT- UTI SYMPTOMS   SUBJECTIVE:  Alicia Rojas is a 39 y.o. [phone] female here for UTI symptoms. She is a GYN patient. She reports dysuria, urinary frequency, and urinary urgency.  OBJECTIVE:  BP (!) 168/106 (BP Location: Left Arm, Patient Position: Sitting, Cuff Size: Large)   Pulse 73   Wt 158 lb 9.6 oz (71.9 kg)   LMP 04/09/2024 (Approximate)   SpO2 100%   BMI 29.97 kg/m   Appears well, in no apparent distress  Results for orders placed or performed in visit on 04/25/24 (from the past 24 hours)  POCT URINALYSIS DIP (CLINITEK)   Collection Time: 04/25/24  3:27 PM  Result Value Ref Range   Color, UA yellow yellow   Clarity, UA cloudy (A) clear   Glucose, UA negative negative mg/dL   Bilirubin, UA negative negative   Ketones, POC UA negative negative mg/dL   Spec Grav, UA 8.974 8.989 - 1.025   Blood, UA moderate (A) negative   pH, UA 7.0 5.0 - 8.0   POC PROTEIN,UA =30 (A) negative, trace   Urobilinogen, UA 4.0 (A) 0.2 or 1.0 E.U./dL   Nitrite, UA Negative Negative   Leukocytes, UA Small (1+) (A) Negative    ASSESSMENT: GYN patient with UTI symptoms and negative nitrites  PLAN: Visit routed to or discussed with: Nidia Daring, NP Rx sent today: Yes Urine culture sent Call or return to clinic prn if these symptoms worsen or fail to improve as anticipated. Follow-up: as needed

## 2024-04-30 ENCOUNTER — Ambulatory Visit: Payer: Self-pay | Admitting: Obstetrics and Gynecology

## 2024-04-30 LAB — URINE CULTURE

## 2024-10-02 ENCOUNTER — Encounter (HOSPITAL_BASED_OUTPATIENT_CLINIC_OR_DEPARTMENT_OTHER): Admitting: Family Medicine

## 2024-10-08 ENCOUNTER — Ambulatory Visit: Admitting: Neurology
